# Patient Record
Sex: Male | Born: 1950 | Race: White | Hispanic: No | Smoking: Current every day smoker
Health system: Southern US, Community
[De-identification: ages and names within clinical notes are randomized; demographics above are authoritative.]

## PROBLEM LIST (undated history)

## (undated) DIAGNOSIS — K635 Polyp of colon: Secondary | ICD-10-CM

## (undated) DIAGNOSIS — I2510 Atherosclerotic heart disease of native coronary artery without angina pectoris: Secondary | ICD-10-CM

## (undated) DIAGNOSIS — E538 Deficiency of other specified B group vitamins: Secondary | ICD-10-CM

## (undated) DIAGNOSIS — K279 Peptic ulcer, site unspecified, unspecified as acute or chronic, without hemorrhage or perforation: Secondary | ICD-10-CM

## (undated) DIAGNOSIS — D649 Anemia, unspecified: Secondary | ICD-10-CM

## (undated) DIAGNOSIS — D61818 Other pancytopenia: Secondary | ICD-10-CM

## (undated) DIAGNOSIS — J449 Chronic obstructive pulmonary disease, unspecified: Secondary | ICD-10-CM

## (undated) DIAGNOSIS — R634 Abnormal weight loss: Secondary | ICD-10-CM

## (undated) DIAGNOSIS — K219 Gastro-esophageal reflux disease without esophagitis: Secondary | ICD-10-CM

## (undated) DIAGNOSIS — D72819 Decreased white blood cell count, unspecified: Secondary | ICD-10-CM

## (undated) HISTORY — PX: SEPTOPLASTY: SUR1290

## (undated) HISTORY — DX: Peptic ulcer, site unspecified, unspecified as acute or chronic, without hemorrhage or perforation: K27.9

---
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
## 1969-03-18 DIAGNOSIS — K279 Peptic ulcer, site unspecified, unspecified as acute or chronic, without hemorrhage or perforation: Secondary | ICD-10-CM

## 1969-03-18 HISTORY — PX: STOMACH SURGERY: SHX791

## 1969-03-18 HISTORY — DX: Peptic ulcer, site unspecified, unspecified as acute or chronic, without hemorrhage or perforation: K27.9

## 1994-03-18 HISTORY — PX: CARDIAC CATHETERIZATION: SHX172

## 1999-02-15 ENCOUNTER — Ambulatory Visit (HOSPITAL_COMMUNITY): Admission: RE | Admit: 1999-02-15 | Discharge: 1999-02-15 | Payer: Self-pay | Admitting: Otolaryngology

## 1999-02-15 ENCOUNTER — Encounter: Payer: Self-pay | Admitting: Otolaryngology

## 2005-04-24 ENCOUNTER — Encounter: Admission: RE | Admit: 2005-04-24 | Discharge: 2005-04-24 | Payer: Self-pay | Admitting: Neurosurgery

## 2005-05-08 ENCOUNTER — Encounter: Admission: RE | Admit: 2005-05-08 | Discharge: 2005-05-08 | Payer: Self-pay | Admitting: Neurosurgery

## 2011-11-15 ENCOUNTER — Other Ambulatory Visit (HOSPITAL_COMMUNITY): Payer: Self-pay | Admitting: Family Medicine

## 2011-11-15 DIAGNOSIS — R42 Dizziness and giddiness: Secondary | ICD-10-CM

## 2011-11-15 DIAGNOSIS — R0989 Other specified symptoms and signs involving the circulatory and respiratory systems: Secondary | ICD-10-CM

## 2011-11-19 ENCOUNTER — Other Ambulatory Visit (HOSPITAL_COMMUNITY): Payer: Self-pay | Admitting: Family Medicine

## 2011-11-19 ENCOUNTER — Ambulatory Visit (HOSPITAL_COMMUNITY)
Admission: RE | Admit: 2011-11-19 | Discharge: 2011-11-19 | Disposition: A | Discharge status: Home / Self Care | Payer: BC Managed Care – PPO | Source: Ambulatory Visit | Attending: Family Medicine | Admitting: Family Medicine

## 2011-11-19 DIAGNOSIS — F17200 Nicotine dependence, unspecified, uncomplicated: Secondary | ICD-10-CM | POA: Insufficient documentation

## 2011-11-19 DIAGNOSIS — R918 Other nonspecific abnormal finding of lung field: Secondary | ICD-10-CM | POA: Insufficient documentation

## 2011-11-19 DIAGNOSIS — R5381 Other malaise: Secondary | ICD-10-CM

## 2011-11-19 LAB — CBC WITH DIFFERENTIAL/PLATELET: RBC: 1.35

## 2011-11-19 IMAGING — CR DG CHEST 2V
2 series · 2 of 2 positions shown · non-contrast
Comparison: None.

CLINICAL DATA: Malaise and fatigue.  Smoker.

CHEST - 2 VIEW

[view not recorded (1 of 2)]
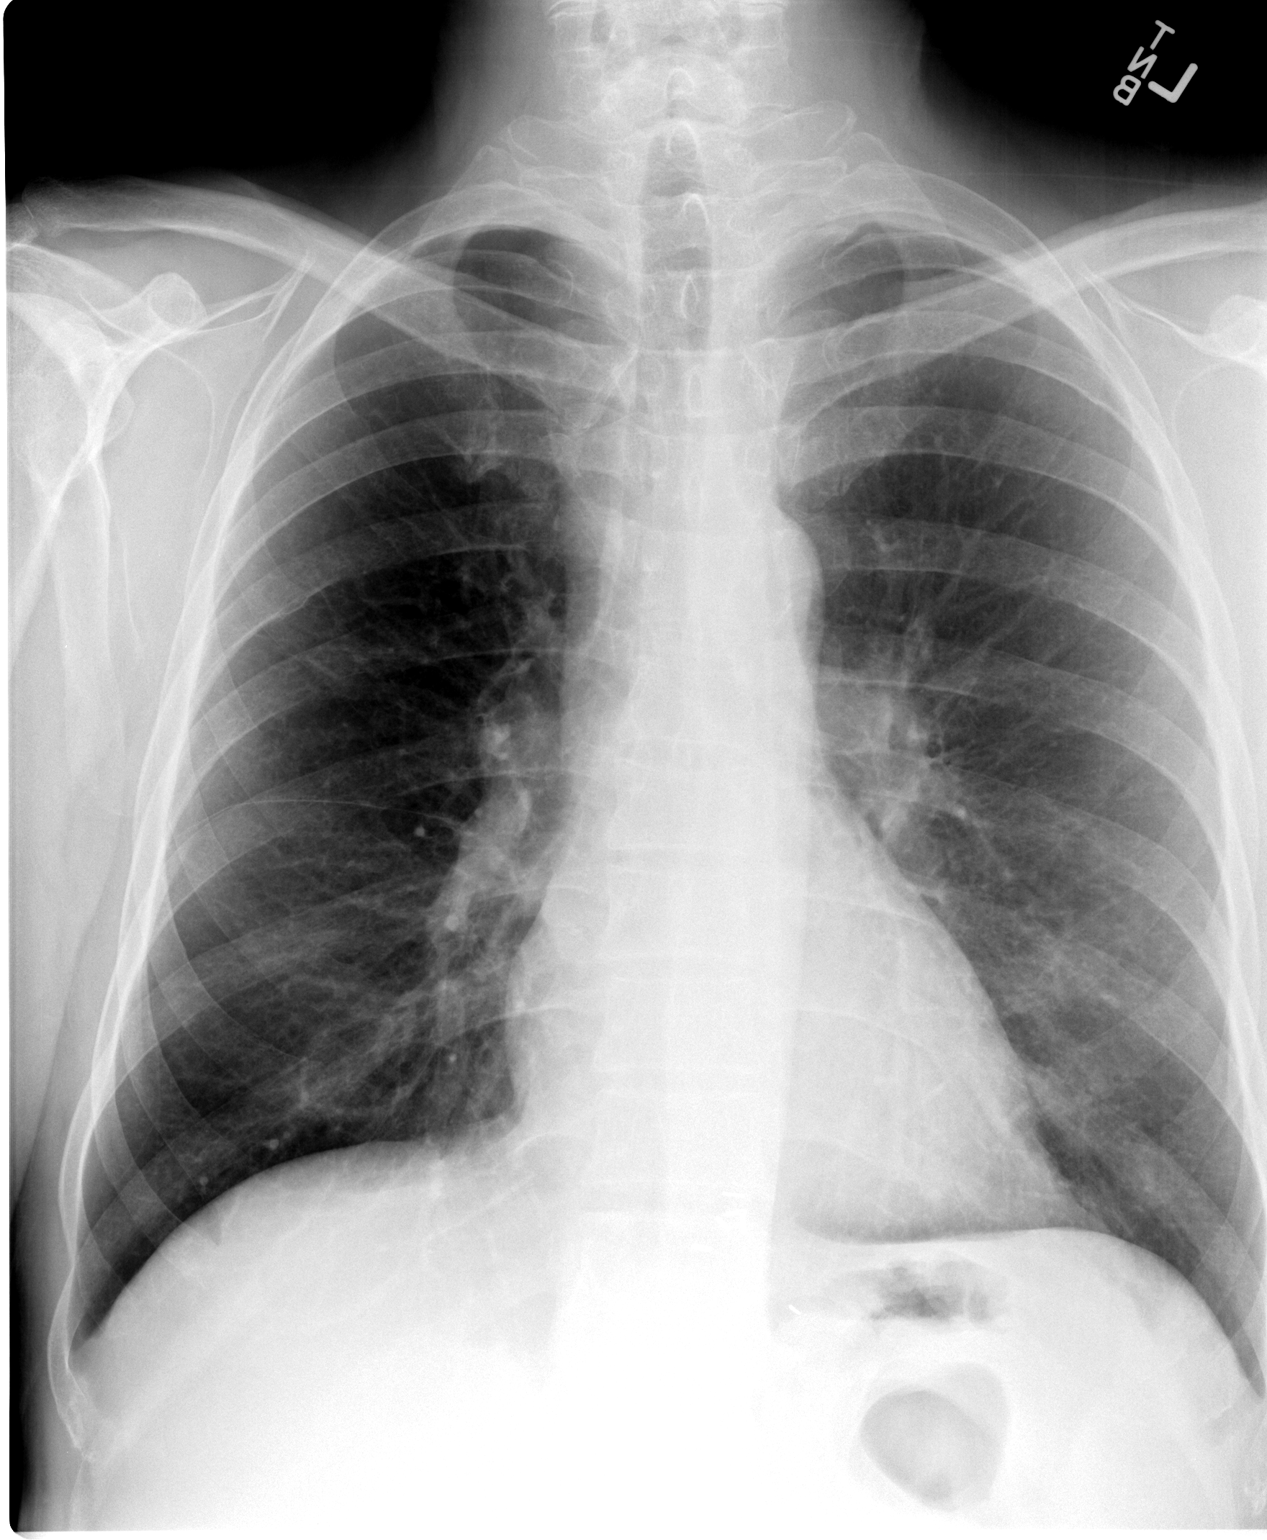

[view not recorded (2 of 2)]
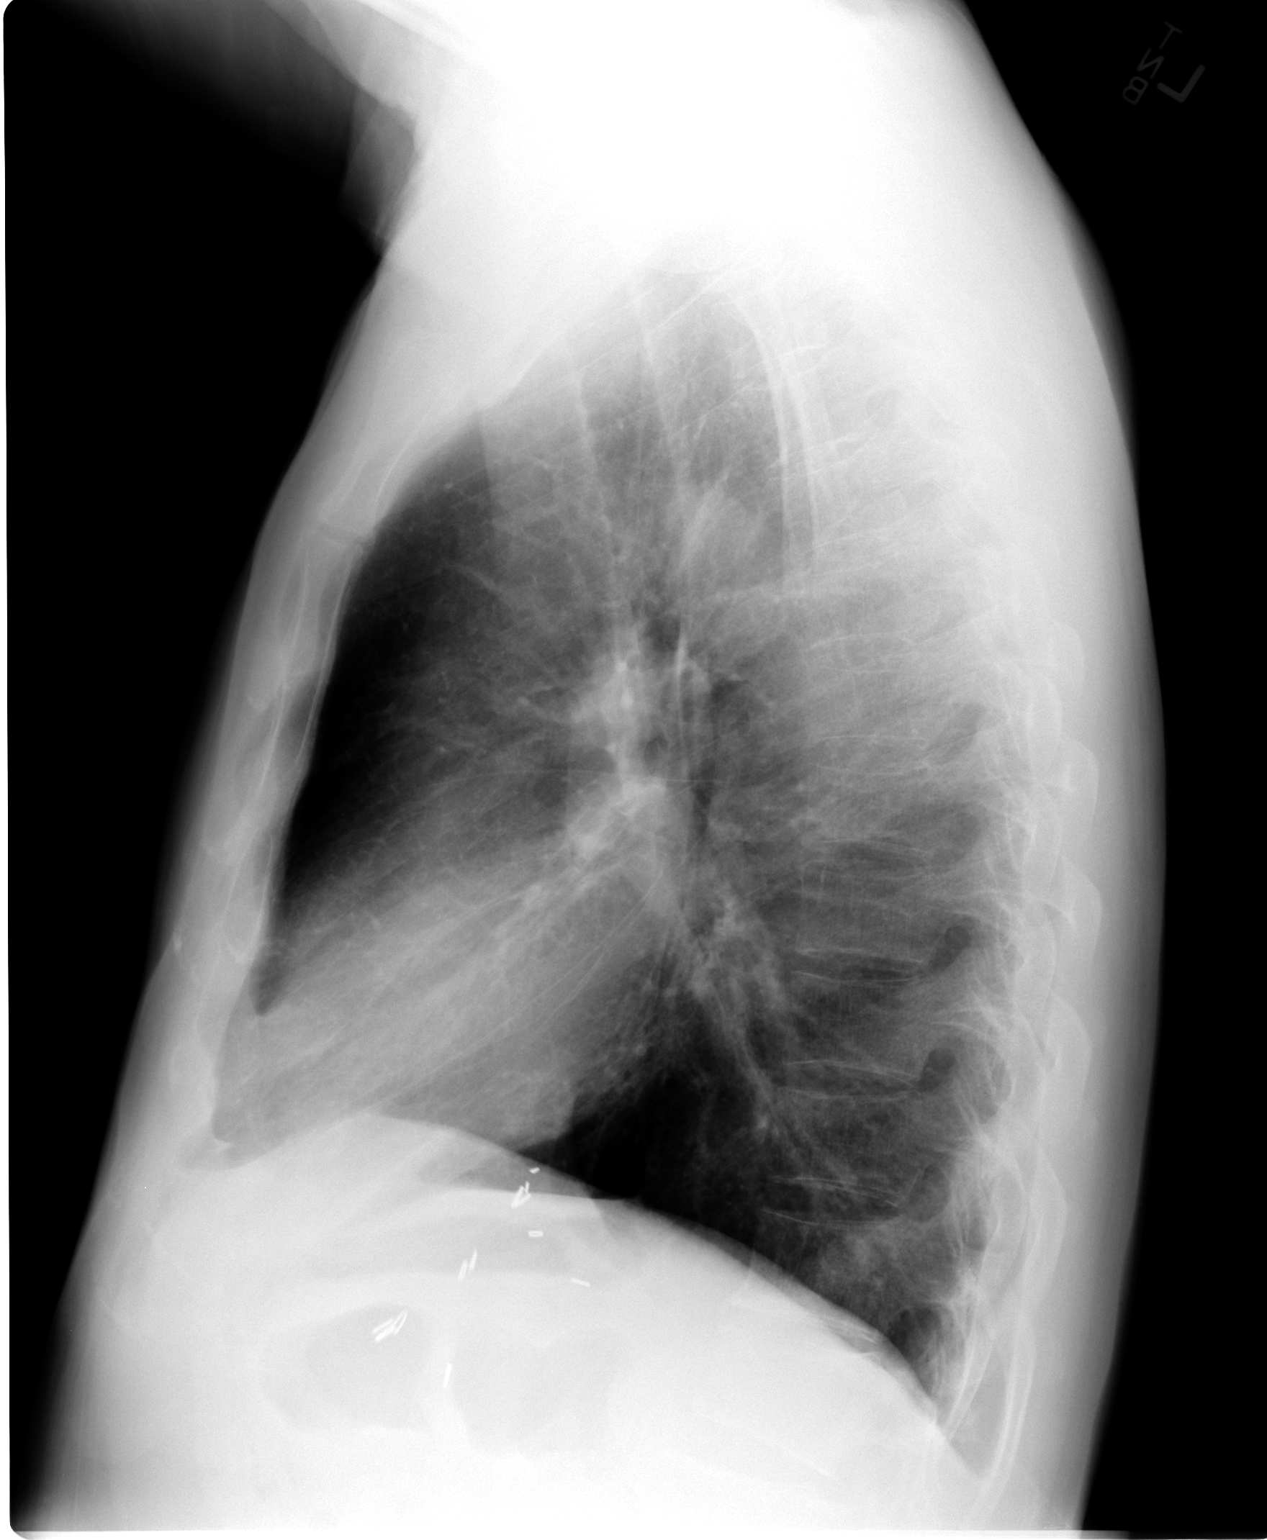

[2 of 2 positions shown; findings below may reference images not displayed]

FINDINGS: The heart, mediastinal, and hilar contours are within
normal limits.  The lungs are normally expanded.  Mild
peribronchial thickening is present. On the lateral view, a rounded
1.5 cm faint nodular density projects over a lower thoracic spine
vertebral body; a pulmonary nodule cannot be excluded.  Negative
for pleural effusion.  Trachea midline.  No acute bony abnormality.
IMPRESSION: Cannot exclude a pulmonary nodule at the lung bases on the lateral
view.  Faint 1.5 cm nodular density projects over the lumbar spine.
Further evaluation with chest CT is suggested. CT could be
performed without contrast for the purposes of evaluating for a
pulmonary nodule.

These results will be called to the ordering clinician or
representative by the Radiologist Assistant, and communication
documented in the PACS Dashboard.

## 2011-11-20 ENCOUNTER — Other Ambulatory Visit (HOSPITAL_COMMUNITY): Payer: Self-pay | Admitting: Family Medicine

## 2011-11-20 ENCOUNTER — Encounter (HOSPITAL_COMMUNITY): Payer: BC Managed Care – PPO

## 2011-11-20 ENCOUNTER — Ambulatory Visit (HOSPITAL_COMMUNITY)
Admission: RE | Admit: 2011-11-20 | Discharge: 2011-11-20 | Disposition: A | Discharge status: Home / Self Care | Payer: BC Managed Care – PPO | Source: Ambulatory Visit | Attending: Family Medicine | Admitting: Family Medicine

## 2011-11-20 ENCOUNTER — Encounter (HOSPITAL_COMMUNITY): Payer: BC Managed Care – PPO | Attending: Family Medicine

## 2011-11-20 ENCOUNTER — Telehealth (HOSPITAL_COMMUNITY): Payer: Self-pay

## 2011-11-20 VITALS — BP 126/67 | HR 77 | Temp 98.7°F | Resp 16

## 2011-11-20 DIAGNOSIS — D649 Anemia, unspecified: Secondary | ICD-10-CM

## 2011-11-20 DIAGNOSIS — R222 Localized swelling, mass and lump, trunk: Secondary | ICD-10-CM

## 2011-11-20 DIAGNOSIS — R918 Other nonspecific abnormal finding of lung field: Secondary | ICD-10-CM | POA: Insufficient documentation

## 2011-11-20 DIAGNOSIS — D61818 Other pancytopenia: Secondary | ICD-10-CM | POA: Insufficient documentation

## 2011-11-20 DIAGNOSIS — E538 Deficiency of other specified B group vitamins: Secondary | ICD-10-CM | POA: Insufficient documentation

## 2011-11-20 DIAGNOSIS — R935 Abnormal findings on diagnostic imaging of other abdominal regions, including retroperitoneum: Secondary | ICD-10-CM | POA: Insufficient documentation

## 2011-11-20 LAB — HEMOGLOBIN AND HEMATOCRIT, BLOOD: HCT: 15.3 % — ABNORMAL LOW (ref 39.0–52.0)

## 2011-11-20 LAB — PREPARE RBC (CROSSMATCH)

## 2011-11-20 IMAGING — CT CT CHEST W/O CM
2 of 3 series · 15 of 36 positions shown, 18 images · non-contrast
Comparison: Chest x-ray [DATE].

CLINICAL DATA: Abnormal chest x-ray.

CT CHEST WITHOUT CONTRAST
TECHNIQUE: Multidetector CT imaging of the chest was performed
following the standard protocol without IV contrast.

[Series 2: chestroutine 5.0 b40f · axial · 0.71mm/px · z∈[-377,-82]mm · 12 of 71 slices shown, 15 images]
[im 6/71  mediastinal]
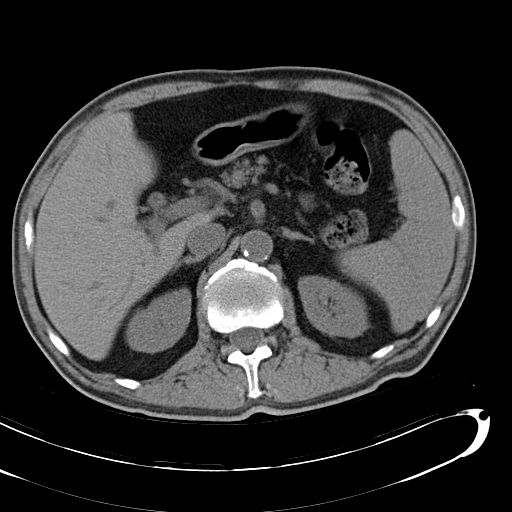
[im 6/71  lung]
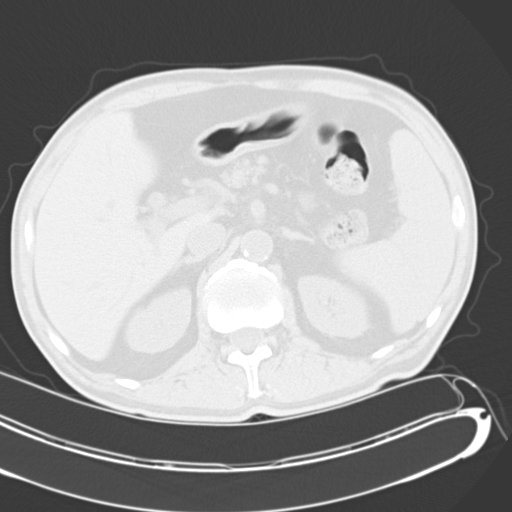
[im 11/71  lung]
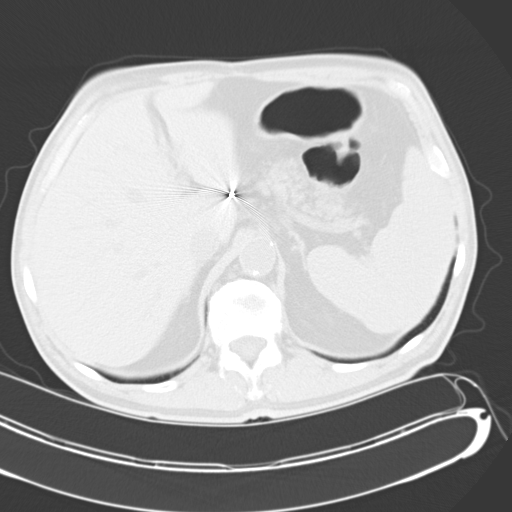
[im 16/71  lung]
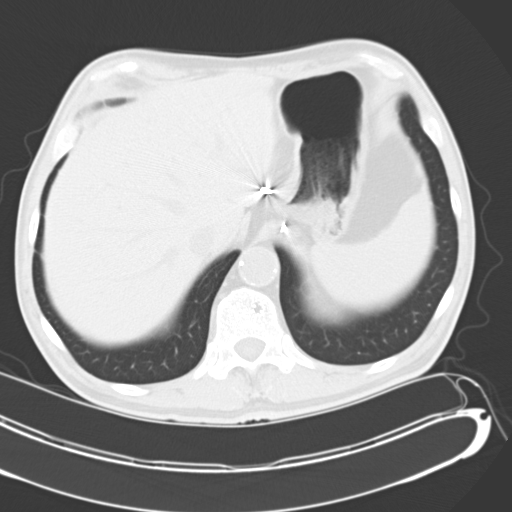
[im 21/71  lung]
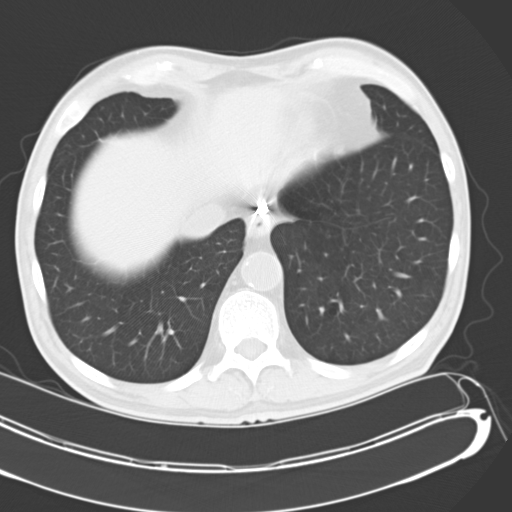
[im 26/71  mediastinal]
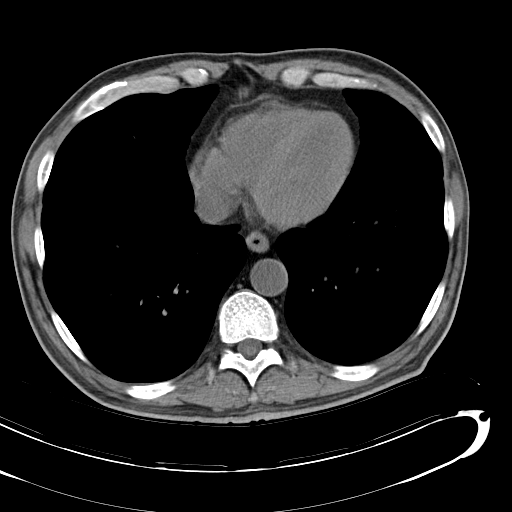
[im 26/71  lung]
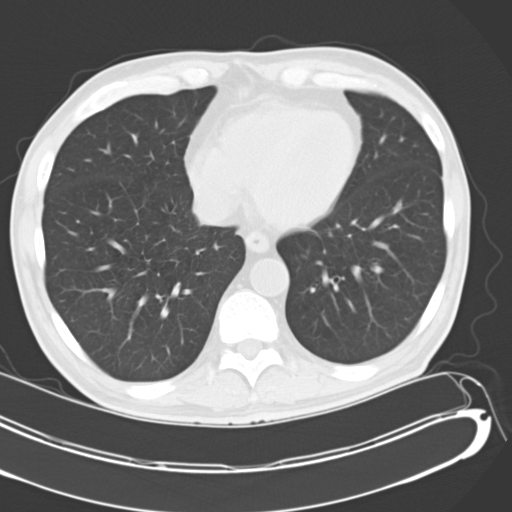
[im 32/71  lung]
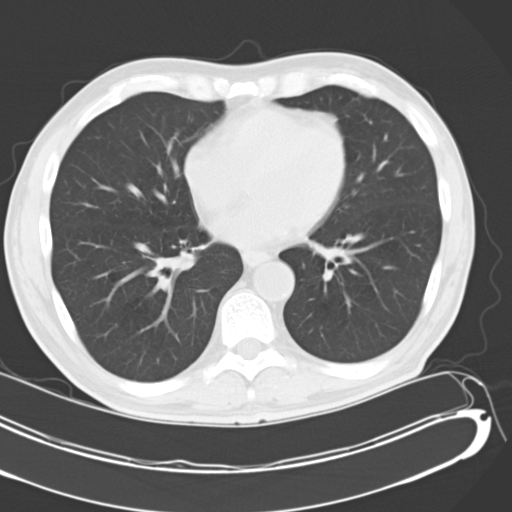
[im 39/71  lung]
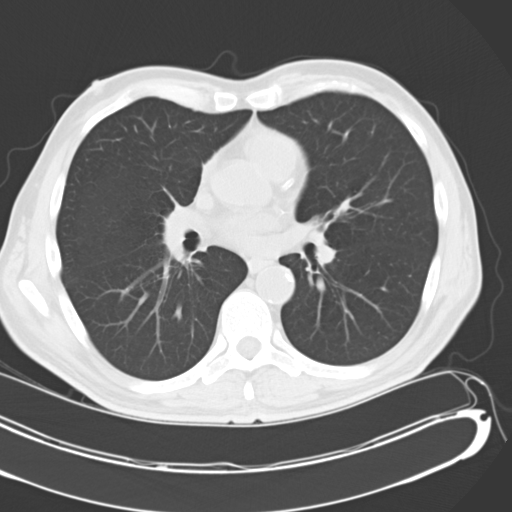
[im 45/71  lung]
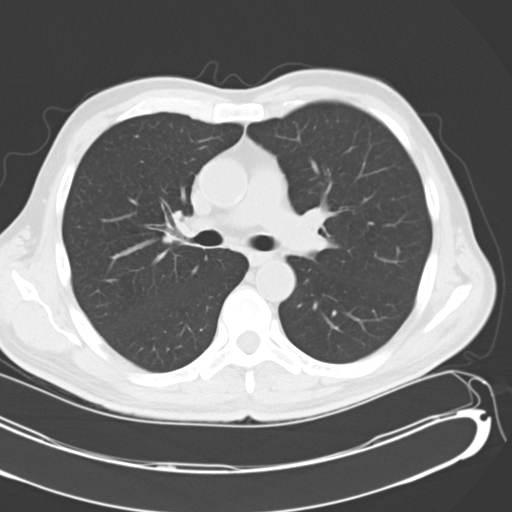
[im 50/71  mediastinal]
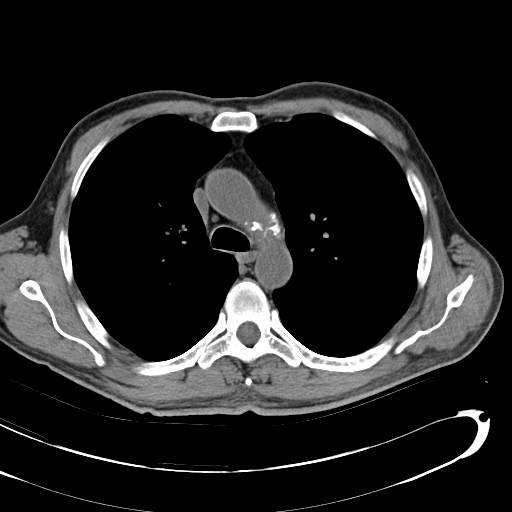
[im 50/71  lung]
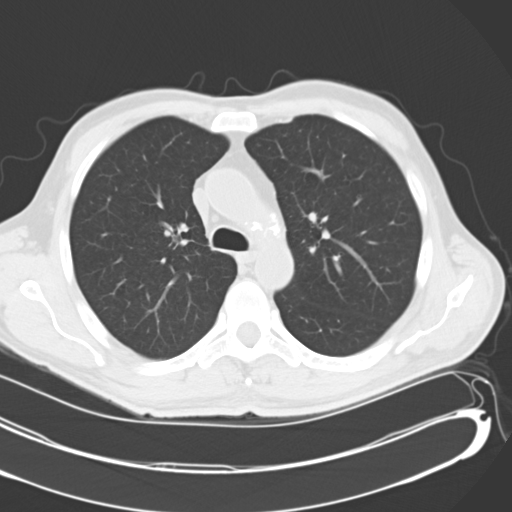
[im 55/71  lung]
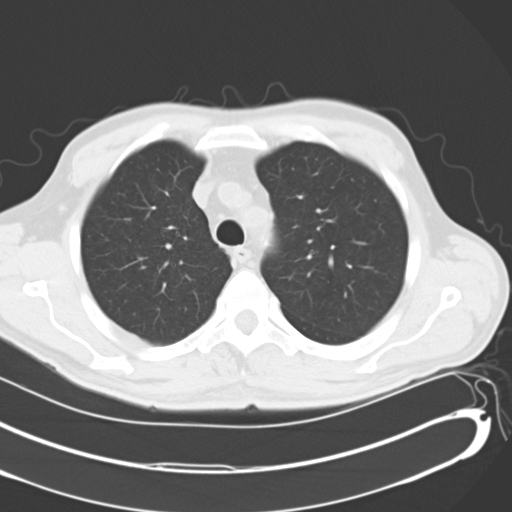
[im 60/71  lung]
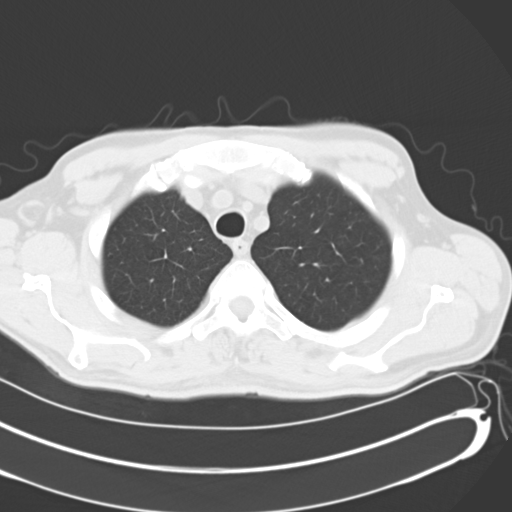
[im 65/71  lung]
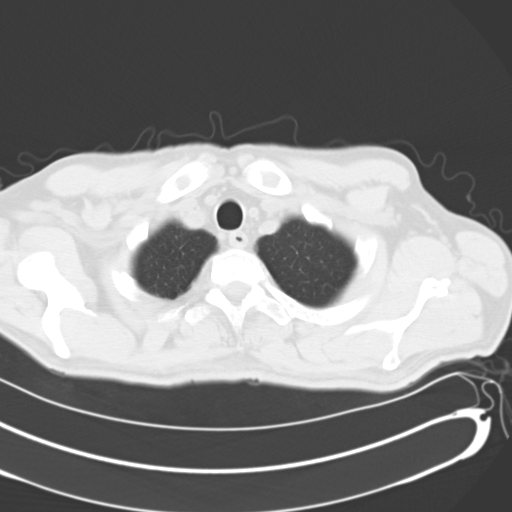

[Series 4: mpr coro 3mm · coronal · 0.64mm/px · 3 of 79 slices shown]
[im 16/79  lung]
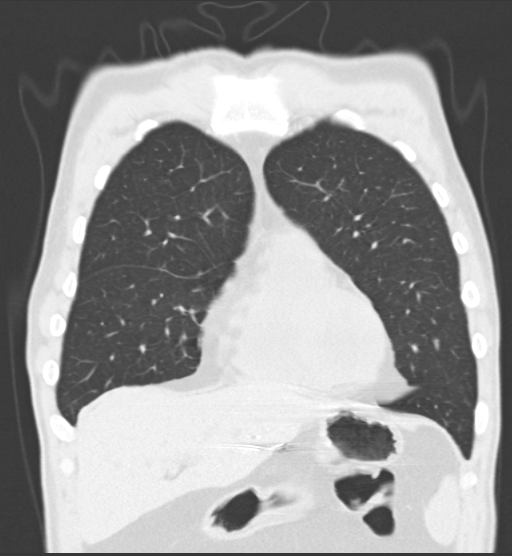
[im 32/79  lung]
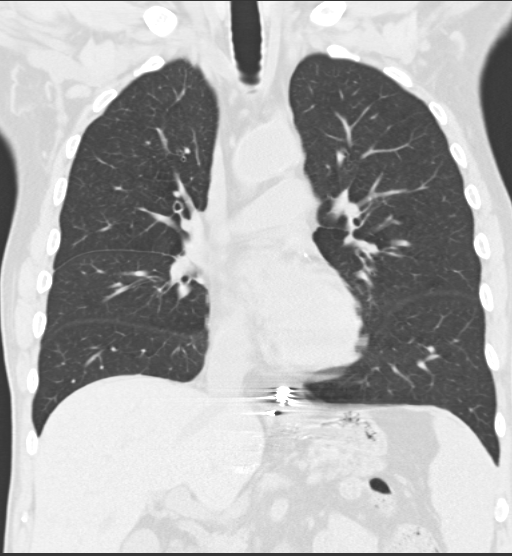
[im 47/79  lung]
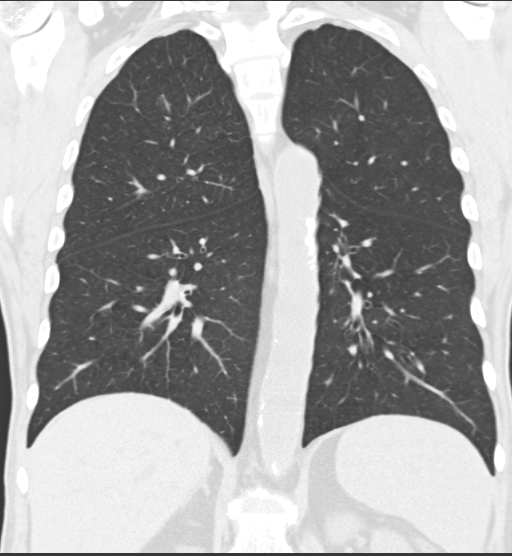

[15 of 36 positions shown; findings below may reference images not displayed]

FINDINGS: The chest wall is unremarkable.  No supraclavicular or
axillary lymphadenopathy.  The thyroid gland appears normal.  The
bony thorax is intact.  No destructive bone lesions or spinal canal
compromise. Moderate osteoporosis is noted.

The heart is normal in size.  No pericardial effusion.  Small
scattered mediastinal and hilar lymph nodes but no lymphadenopathy.
The aorta is normal in caliber.  Mild atherosclerotic
calcifications but no focal aneurysm.  Coronary artery
calcifications are noted.  The esophagus is grossly normal.
Surgical changes are noted near the GE junction.

Examination of the lung parenchyma demonstrates no acute pulmonary
findings.  No worrisome pulmonary nodule or mass.  Emphysematous
changes are noted.  No pleural effusion.

The upper abdomen demonstrates a mild splenomegaly.  Surgical
changes noted near the GE junction.
IMPRESSION: 1.  No worrisome pulmonary nodule or mass is identified.
2.  Emphysematous changes.
3.  Surgical changes noted at the GE junction.
4.  Query mild splenomegaly.  The entire spleen is not imaged.

## 2011-11-20 MED ORDER — SODIUM CHLORIDE 0.9 % IV SOLN
250.0000 mL | Freq: Once | INTRAVENOUS | Status: AC
  Administered 2011-11-20: 250 mL via INTRAVENOUS

## 2011-11-20 MED ORDER — SODIUM CHLORIDE 0.9 % IJ SOLN
10.0000 mL | INTRAMUSCULAR | Status: AC | PRN
  Administered 2011-11-20: 10 mL

## 2011-11-20 NOTE — Telephone Encounter (Signed)
CRITICAL VALUE ALERT Critical value received:  Hemoglobin 5.4 gms Date of notification:  11/20/11  Time of notification: 1045 Critical value read back:  yes Nurse who received alert:  Tobie Lords, RN MD notified (1st page):  N/A - scheduled for transfusion on 11/21/11

## 2011-11-20 NOTE — Progress Notes (Signed)
Tolerated transfusion without s/s adverse reaction. 

## 2011-11-20 NOTE — Progress Notes (Signed)
Labs drawn today for type and cross 

## 2011-11-21 ENCOUNTER — Encounter (HOSPITAL_COMMUNITY): Payer: BC Managed Care – PPO

## 2011-11-21 ENCOUNTER — Ambulatory Visit (HOSPITAL_COMMUNITY)
Admission: RE | Admit: 2011-11-21 | Discharge: 2011-11-21 | Disposition: A | Discharge status: Home / Self Care | Payer: BC Managed Care – PPO | Source: Ambulatory Visit | Attending: Family Medicine | Admitting: Family Medicine

## 2011-11-21 DIAGNOSIS — R42 Dizziness and giddiness: Secondary | ICD-10-CM

## 2011-11-21 DIAGNOSIS — R0989 Other specified symptoms and signs involving the circulatory and respiratory systems: Secondary | ICD-10-CM

## 2011-11-21 DIAGNOSIS — I2510 Atherosclerotic heart disease of native coronary artery without angina pectoris: Secondary | ICD-10-CM | POA: Insufficient documentation

## 2011-11-21 DIAGNOSIS — F17200 Nicotine dependence, unspecified, uncomplicated: Secondary | ICD-10-CM | POA: Insufficient documentation

## 2011-11-21 LAB — TYPE AND SCREEN
ABO/RH(D): O POS
Unit division: 0
Unit division: 0

## 2011-11-21 IMAGING — US US AORTA
1 series · 14 of 18 positions shown · non-contrast
Comparison: None.

CLINICAL DATA: Hypertension.  Evaluate for abdominal aortic
aneurysm.

ULTRASOUND OF ABDOMINAL AORTA
TECHNIQUE: Ultrasound examination of the abdominal aorta was
performed to evaluate for abdominal aortic aneurysm.

[Series 1: us aorta · 0.28mm/px · 14 of 18 slices shown]
[im 1/18]
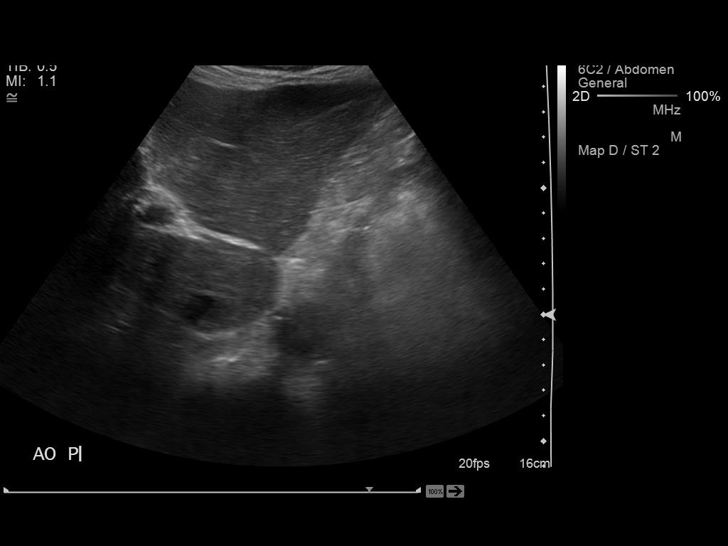
[im 2/18]
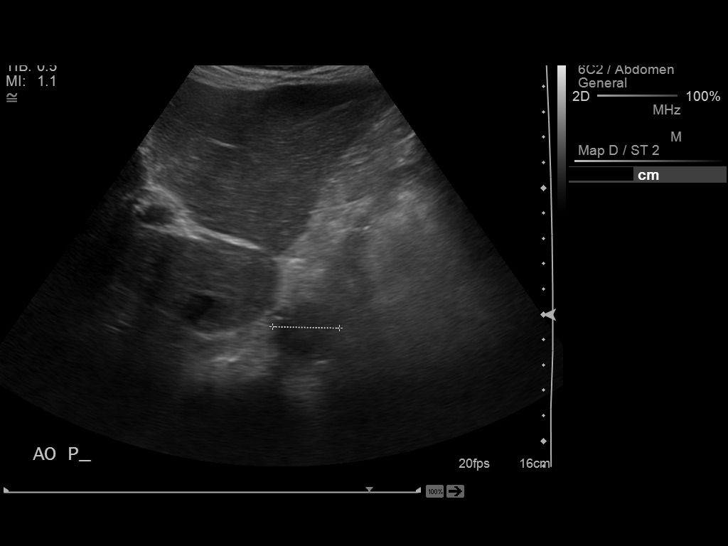
[im 4/18]
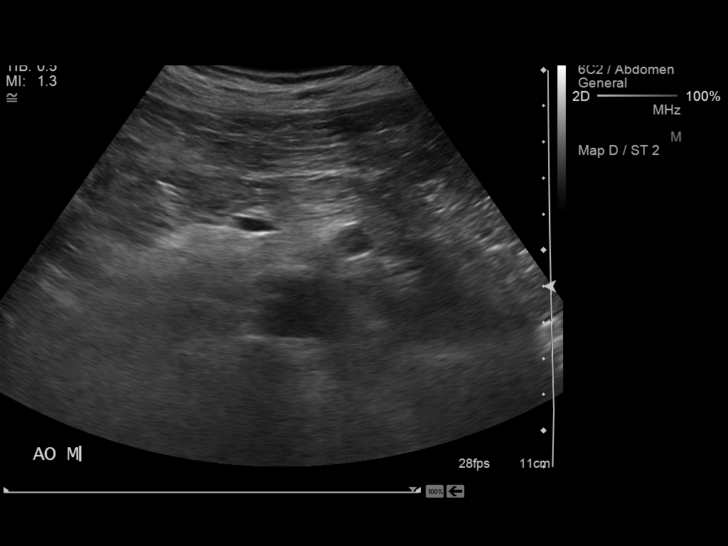
[im 5/18]
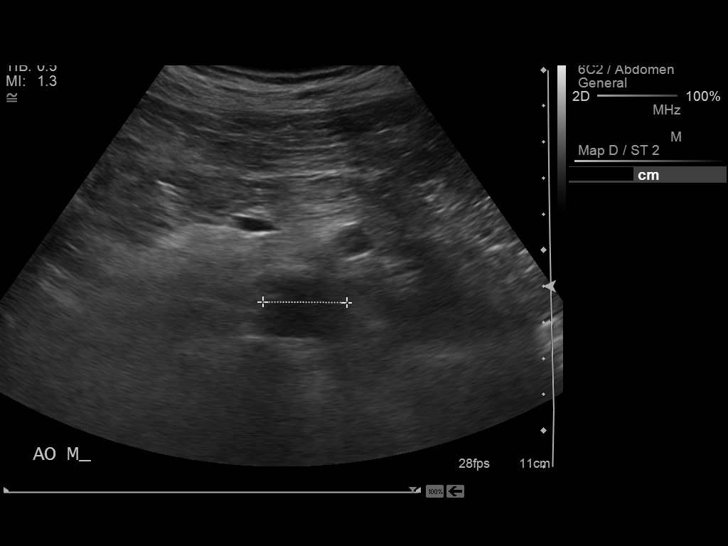
[im 6/18]
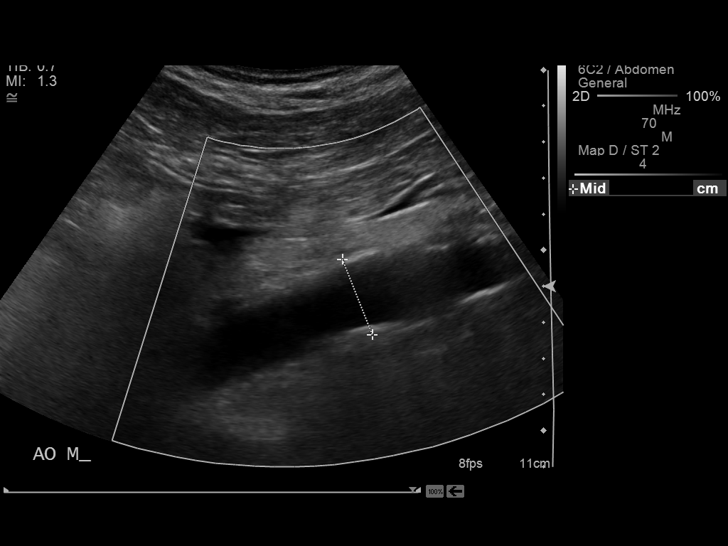
[im 8/18]
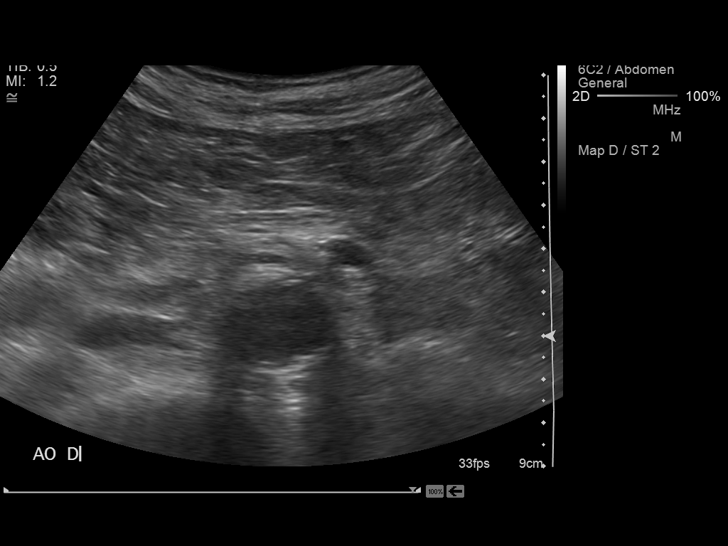
[im 9/18]
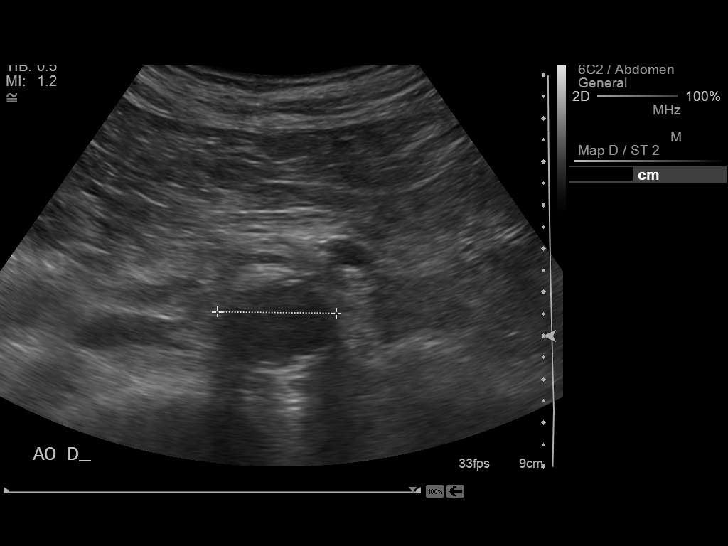
[im 10/18]
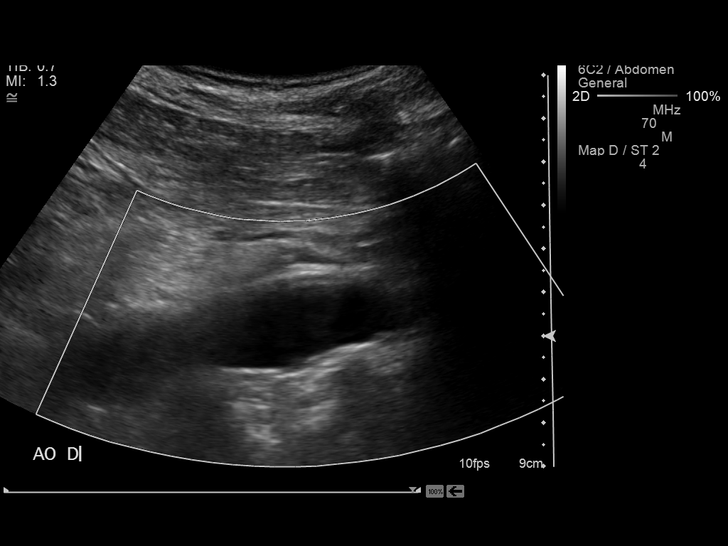
[im 11/18]
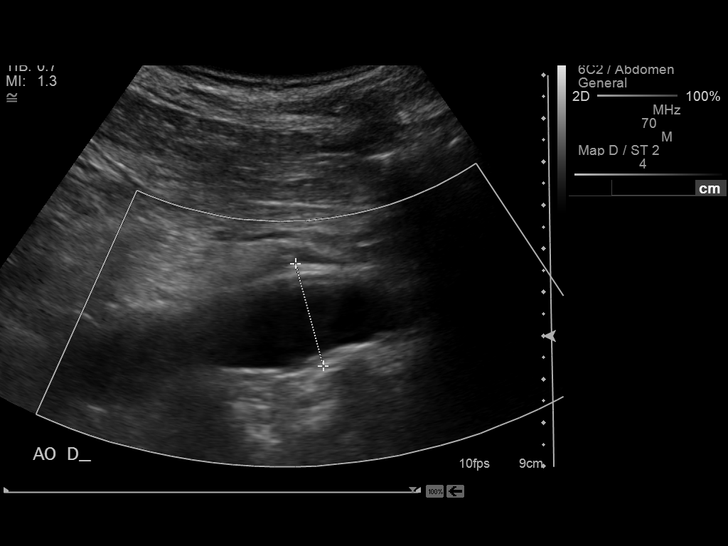
[im 13/18]
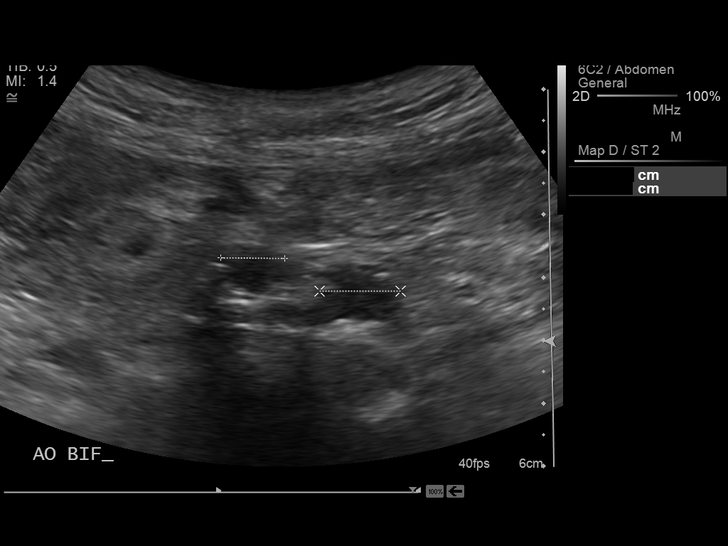
[im 14/18]
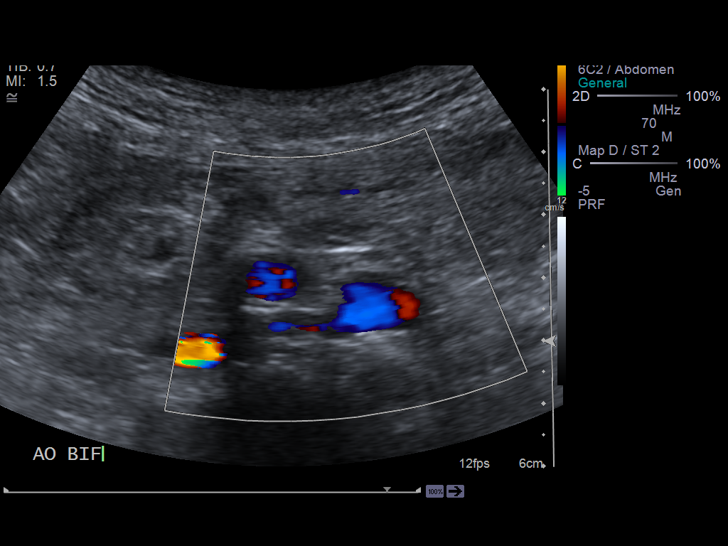
[im 15/18]
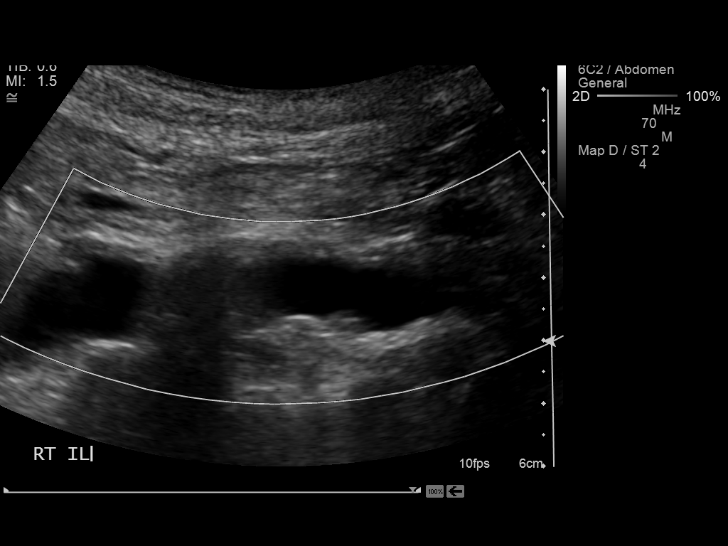
[im 17/18]
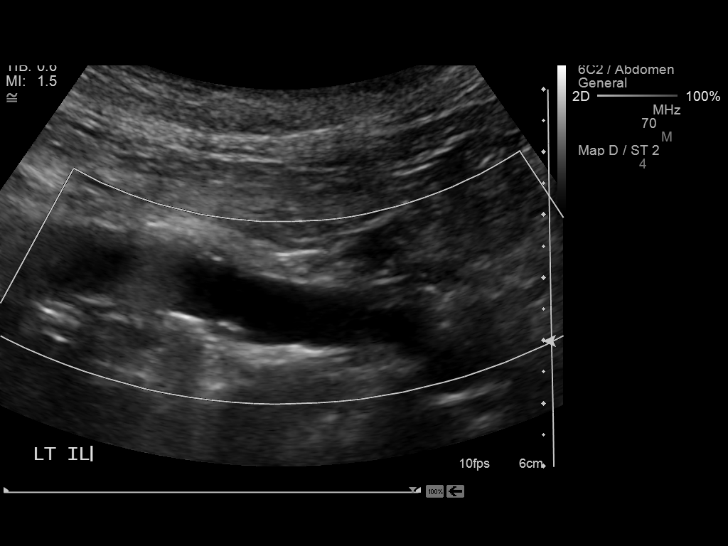
[im 18/18]
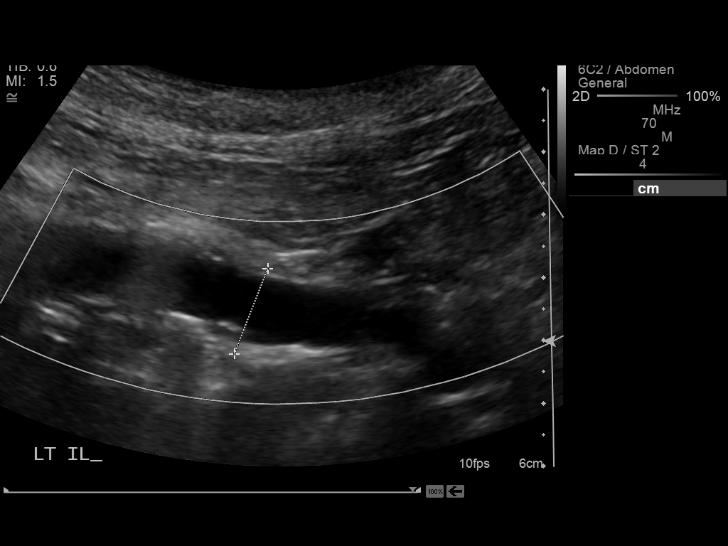

[14 of 18 positions shown; findings below may reference images not displayed]

Abdominal Aorta:  No aneurysm identified.  Mild atherosclerotic
plaque noted.

      Maximum AP diameter:  2.8 cm.
      Maximum TRV diameter:  2.6 cm.
IMPRESSION: No abdominal aortic aneurysm identified.

## 2011-11-21 IMAGING — US US CAROTID DUPLEX BILAT
1 series · 13 of 24 positions shown · non-contrast
Comparison: None.

CLINICAL DATA: Right carotid bruit, hypertension and tobacco use.
History of coronary artery disease.

BILATERAL CAROTID DUPLEX ULTRASOUND
TECHNIQUE: Gray scale imaging, color Doppler and duplex ultrasound
was performed of bilateral carotid and vertebral arteries in the
neck.

[Series 1: us carotid duplex bilat · 0.05mm/px · 13 of 67 slices shown]
[im 1/67]
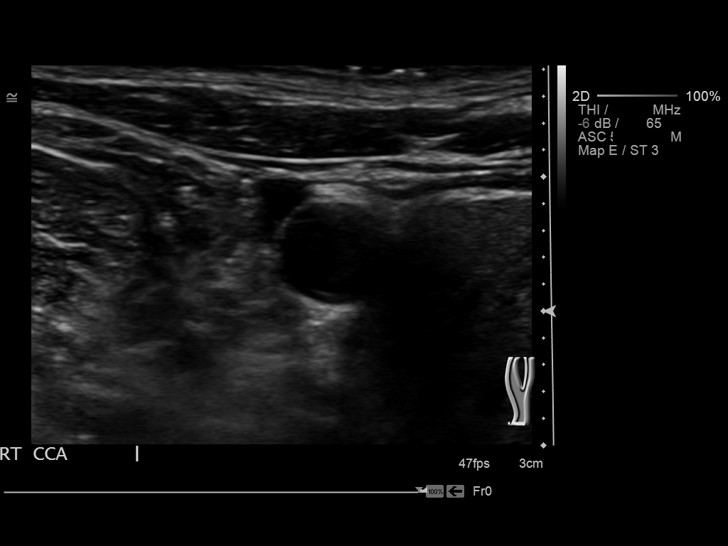
[im 6/67]
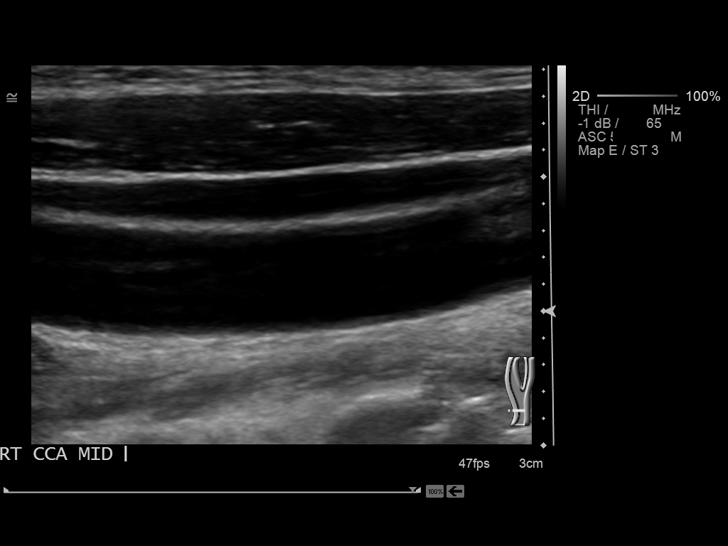
[im 12/67]
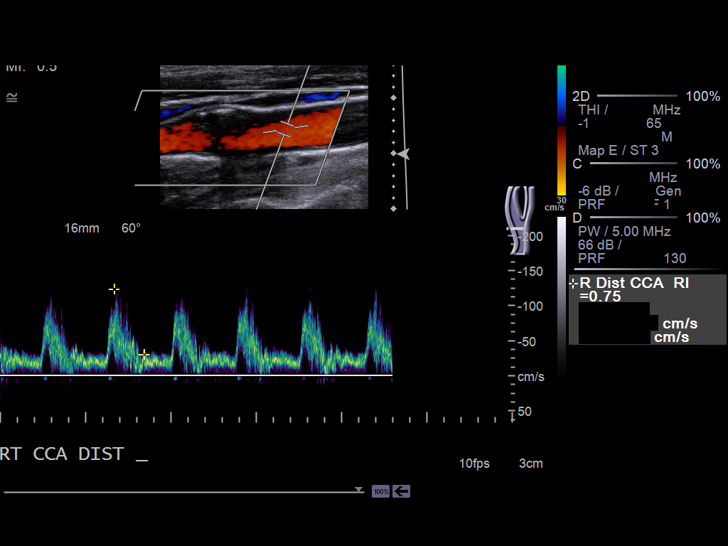
[im 18/67]
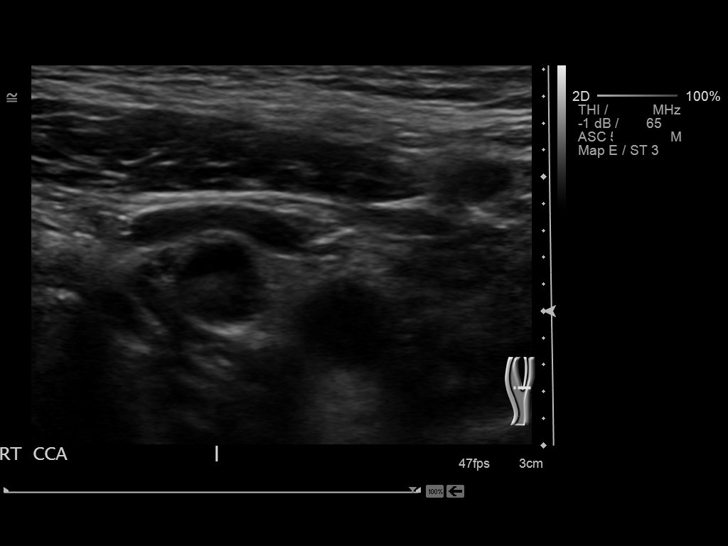
[im 23/67]
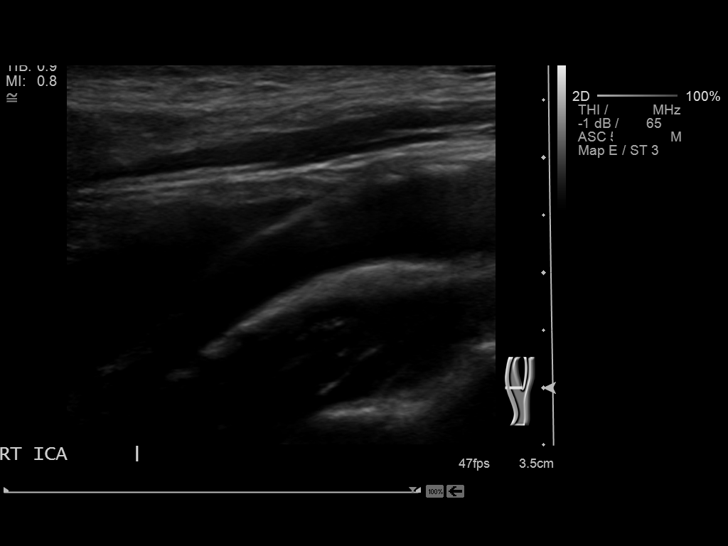
[im 29/67]
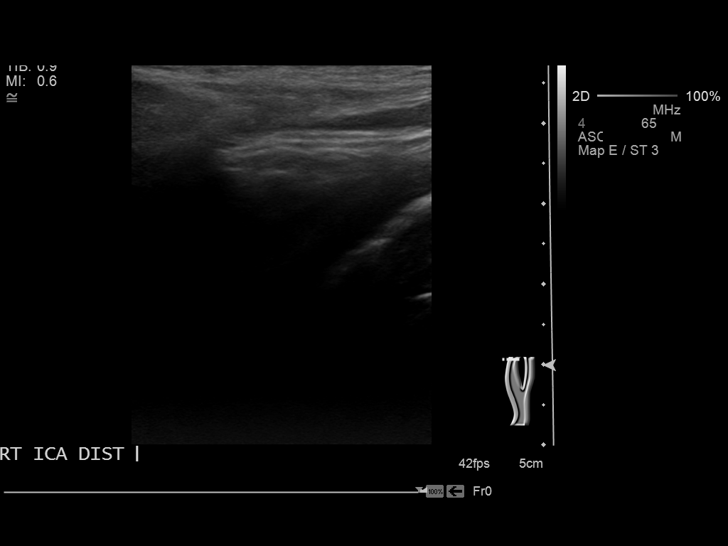
[im 35/67]
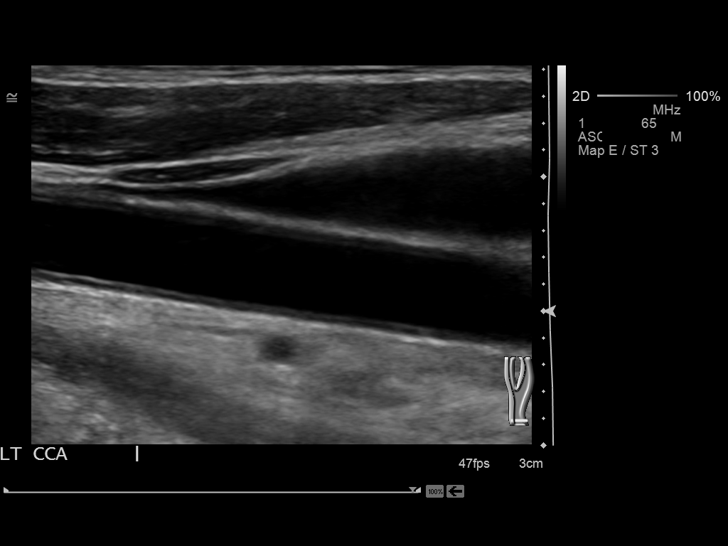
[im 38/67]
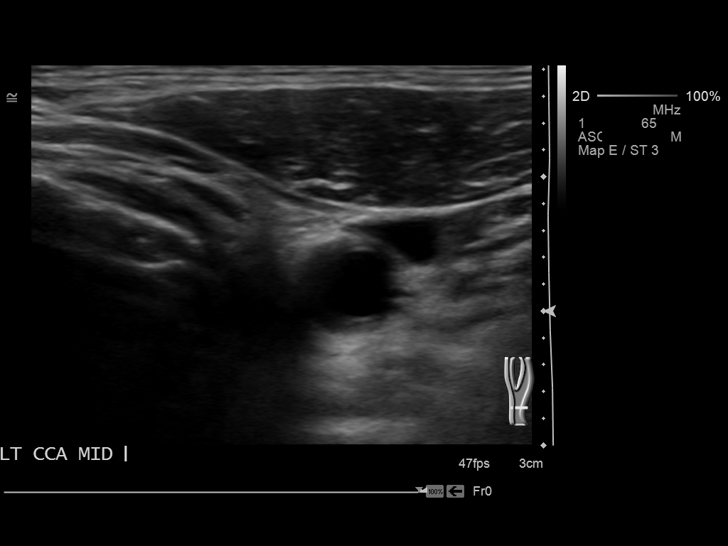
[im 44/67]
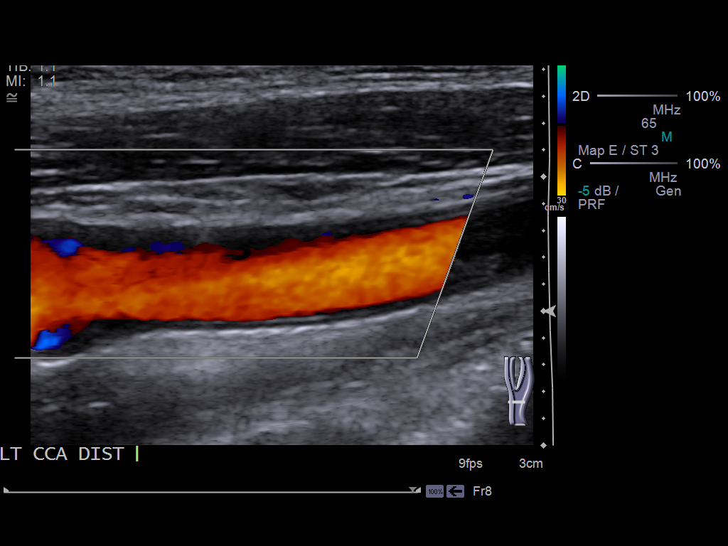
[im 49/67]
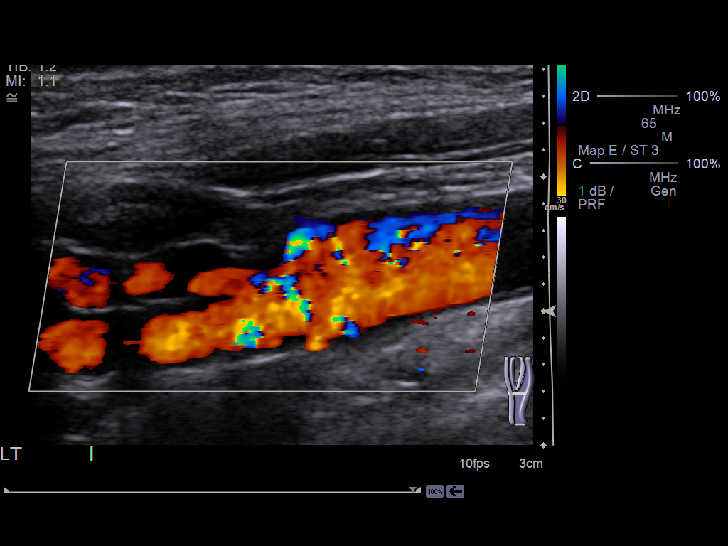
[im 55/67]
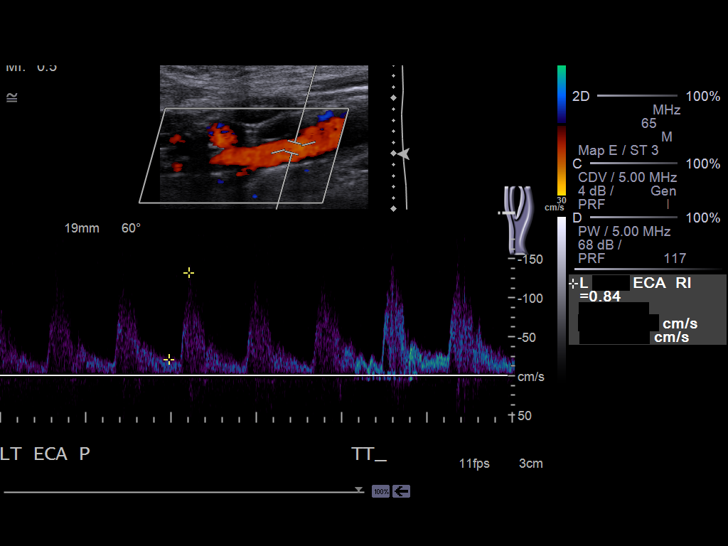
[im 61/67]
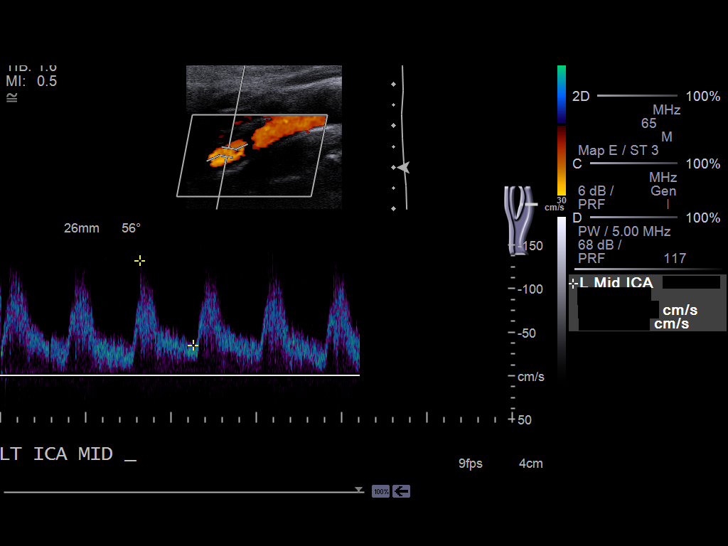
[im 67/67]
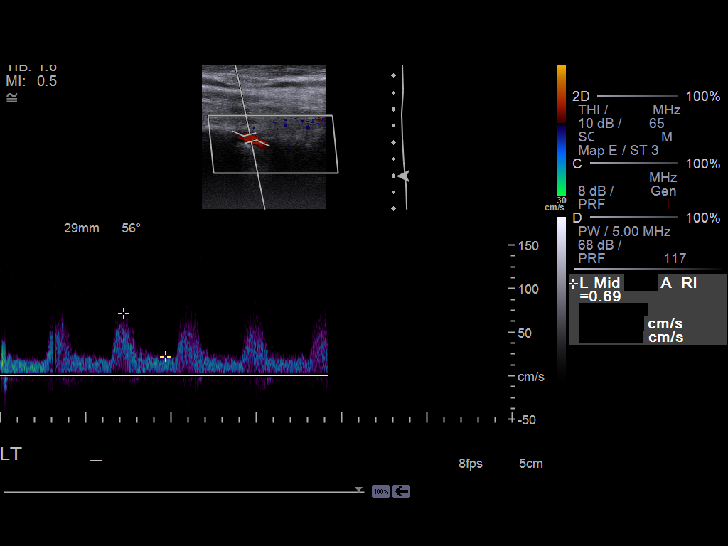

[13 of 24 positions shown; findings below may reference images not displayed]

Criteria:  Quantification of carotid stenosis is based on velocity
parameters that correlate the residual internal carotid diameter
with NASCET-based stenosis levels, using the diameter of the distal
internal carotid lumen as the denominator for stenosis measurement.

The following velocity measurements were obtained:

                 PEAK SYSTOLIC/END DIASTOLIC
RIGHT
ICA:                        132/35cm/sec
CCA:                        157/35cm/sec
SYSTOLIC ICA/CCA RATIO:
DIASTOLIC ICA/CCA RATIO:
ECA:                        100cm/sec

LEFT
ICA:                        132/35cm/sec
CCA:                        158/32cm/sec
SYSTOLIC ICA/CCA RATIO:
DIASTOLIC ICA/CCA RATIO:
ECA:                        132cm/sec
FINDINGS: RIGHT CAROTID ARTERY: A minimal amount of partially calcified
plaque is present at the level of the carotid bulb.  Minimal plaque
extends to the near the ICA origin.  Estimated ICA stenosis is less
than 50%.

RIGHT VERTEBRAL ARTERY:  Antegrade flow with normal wave form.

LEFT CAROTID ARTERY: No significant plaque identified.  There may
be a tiny amount of noncalcified plaque at the level of the distal
bulb.  No ICA plaque or stenosis identified.

LEFT VERTEBRAL ARTERY:  Antegrade flow with normal wave form.
IMPRESSION: Minimal amount of plaque at the level of the carotid bulbs without
evidence of significant carotid stenosis.

## 2011-11-26 ENCOUNTER — Telehealth: Payer: Self-pay | Admitting: *Deleted

## 2011-11-26 NOTE — Telephone Encounter (Signed)
LMOM to call.

## 2011-11-26 NOTE — Telephone Encounter (Signed)
Frank Lin called today to set up his colonoscopy. Please call him back.

## 2011-11-26 NOTE — Telephone Encounter (Signed)
Pt is scheduled to see Lorenza Burton, NP on 9/12/2013n at 8:00 Am. He recently had to have 2 pints of blood he states.

## 2011-11-28 ENCOUNTER — Ambulatory Visit (INDEPENDENT_AMBULATORY_CARE_PROVIDER_SITE_OTHER): Payer: BC Managed Care – PPO | Admitting: Urgent Care

## 2011-11-28 ENCOUNTER — Encounter (HOSPITAL_COMMUNITY): Payer: Self-pay | Admitting: Pharmacy Technician

## 2011-11-28 ENCOUNTER — Telehealth: Payer: Self-pay

## 2011-11-28 ENCOUNTER — Other Ambulatory Visit: Payer: Self-pay | Admitting: Urgent Care

## 2011-11-28 ENCOUNTER — Encounter (HOSPITAL_COMMUNITY): Payer: Self-pay | Admitting: *Deleted

## 2011-11-28 ENCOUNTER — Inpatient Hospital Stay (HOSPITAL_COMMUNITY)
Admission: AD | Admit: 2011-11-28 | Discharge: 2011-11-30 | DRG: 574 | Disposition: A | Discharge status: Home / Self Care | Payer: BC Managed Care – PPO | Source: Ambulatory Visit | Attending: Internal Medicine | Admitting: Internal Medicine

## 2011-11-28 ENCOUNTER — Encounter: Payer: Self-pay | Admitting: Urgent Care

## 2011-11-28 VITALS — BP 112/63 | HR 83 | Temp 97.8°F | Ht 69.0 in | Wt 158.0 lb

## 2011-11-28 DIAGNOSIS — IMO0002 Reserved for concepts with insufficient information to code with codable children: Secondary | ICD-10-CM

## 2011-11-28 DIAGNOSIS — K635 Polyp of colon: Secondary | ICD-10-CM

## 2011-11-28 DIAGNOSIS — F17200 Nicotine dependence, unspecified, uncomplicated: Secondary | ICD-10-CM | POA: Diagnosis present

## 2011-11-28 DIAGNOSIS — Z8711 Personal history of peptic ulcer disease: Secondary | ICD-10-CM

## 2011-11-28 DIAGNOSIS — D72819 Decreased white blood cell count, unspecified: Secondary | ICD-10-CM | POA: Diagnosis present

## 2011-11-28 DIAGNOSIS — E538 Deficiency of other specified B group vitamins: Secondary | ICD-10-CM | POA: Diagnosis present

## 2011-11-28 DIAGNOSIS — R634 Abnormal weight loss: Secondary | ICD-10-CM

## 2011-11-28 DIAGNOSIS — Z9861 Coronary angioplasty status: Secondary | ICD-10-CM

## 2011-11-28 DIAGNOSIS — D539 Nutritional anemia, unspecified: Secondary | ICD-10-CM | POA: Diagnosis present

## 2011-11-28 DIAGNOSIS — D649 Anemia, unspecified: Secondary | ICD-10-CM

## 2011-11-28 DIAGNOSIS — I2510 Atherosclerotic heart disease of native coronary artery without angina pectoris: Secondary | ICD-10-CM | POA: Diagnosis present

## 2011-11-28 DIAGNOSIS — E43 Unspecified severe protein-calorie malnutrition: Secondary | ICD-10-CM | POA: Diagnosis present

## 2011-11-28 DIAGNOSIS — R195 Other fecal abnormalities: Secondary | ICD-10-CM | POA: Insufficient documentation

## 2011-11-28 DIAGNOSIS — D61818 Other pancytopenia: Principal | ICD-10-CM

## 2011-11-28 DIAGNOSIS — K279 Peptic ulcer, site unspecified, unspecified as acute or chronic, without hemorrhage or perforation: Secondary | ICD-10-CM

## 2011-11-28 DIAGNOSIS — Z23 Encounter for immunization: Secondary | ICD-10-CM

## 2011-11-28 DIAGNOSIS — Z7982 Long term (current) use of aspirin: Secondary | ICD-10-CM

## 2011-11-28 DIAGNOSIS — R161 Splenomegaly, not elsewhere classified: Secondary | ICD-10-CM | POA: Diagnosis present

## 2011-11-28 DIAGNOSIS — Z9089 Acquired absence of other organs: Secondary | ICD-10-CM

## 2011-11-28 DIAGNOSIS — D126 Benign neoplasm of colon, unspecified: Secondary | ICD-10-CM | POA: Diagnosis present

## 2011-11-28 DIAGNOSIS — R17 Unspecified jaundice: Secondary | ICD-10-CM | POA: Diagnosis present

## 2011-11-28 DIAGNOSIS — Z87891 Personal history of nicotine dependence: Secondary | ICD-10-CM

## 2011-11-28 HISTORY — DX: Polyp of colon: K63.5

## 2011-11-28 HISTORY — DX: Atherosclerotic heart disease of native coronary artery without angina pectoris: I25.10

## 2011-11-28 HISTORY — DX: Abnormal weight loss: R63.4

## 2011-11-28 HISTORY — DX: Other pancytopenia: D61.818

## 2011-11-28 HISTORY — DX: Anemia, unspecified: D64.9

## 2011-11-28 HISTORY — DX: Deficiency of other specified B group vitamins: E53.8

## 2011-11-28 HISTORY — DX: Decreased white blood cell count, unspecified: D72.819

## 2011-11-28 LAB — CBC WITH DIFFERENTIAL/PLATELET
Basophils Absolute: 0 10*3/uL (ref 0.0–0.1)
Basophils Relative: 0 % (ref 0–1)
Eosinophils Absolute: 0.1 10*3/uL (ref 0.0–0.7)
Eosinophils Relative: 2 % (ref 0–5)
Eosinophils Relative: 2 % (ref 0–5)
Hemoglobin: 6.8 g/dL — CL (ref 13.0–17.0)
Lymphs Abs: 1.1 10*3/uL (ref 0.7–4.0)
MCH: 37.2 pg — ABNORMAL HIGH (ref 26.0–34.0)
MCH: 36.5 pg — ABNORMAL HIGH (ref 26.0–34.0)
MCHC: 35.2 g/dL (ref 30.0–36.0)
MCV: 105.5 fL — ABNORMAL HIGH (ref 78.0–100.0)
Monocytes Absolute: 0.1 10*3/uL (ref 0.1–1.0)
Monocytes Relative: 5 % (ref 3–12)
Monocytes Relative: 3 % (ref 3–12)
Neutrophils Relative %: 53 % (ref 43–77)
Neutrophils Relative %: 55 % (ref 43–77)
Platelets: 70 10*3/uL — ABNORMAL LOW (ref 150–400)
RBC: 1.67 MIL/uL — ABNORMAL LOW (ref 4.22–5.81)
WBC: 2.6 10*3/uL — ABNORMAL LOW (ref 4.0–10.5)

## 2011-11-28 LAB — COMPREHENSIVE METABOLIC PANEL
Alkaline Phosphatase: 80 U/L (ref 39–117)
BUN: 13 mg/dL (ref 6–23)
CO2: 28 mEq/L (ref 19–32)
Creat: 0.71 mg/dL (ref 0.50–1.35)
Glucose, Bld: 142 mg/dL — ABNORMAL HIGH (ref 70–99)
Sodium: 139 mEq/L (ref 135–145)
Total Bilirubin: 2.5 mg/dL — ABNORMAL HIGH (ref 0.3–1.2)
Total Protein: 6.4 g/dL (ref 6.0–8.3)

## 2011-11-28 LAB — IRON AND TIBC
Iron: 232 ug/dL — ABNORMAL HIGH (ref 42–165)
UIBC: <15 ug/dL — ABNORMAL LOW (ref 125–400)

## 2011-11-28 LAB — LACTATE DEHYDROGENASE: LDH: 2570 U/L — ABNORMAL HIGH (ref 94–250)

## 2011-11-28 LAB — PREPARE RBC (CROSSMATCH)

## 2011-11-28 LAB — URINALYSIS, ROUTINE W REFLEX MICROSCOPIC
Glucose, UA: NEGATIVE mg/dL
Hgb urine dipstick: NEGATIVE
Specific Gravity, Urine: <1.005 — ABNORMAL LOW (ref 1.005–1.030)

## 2011-11-28 LAB — VITAMIN B12: Vitamin B-12: 72 pg/mL — ABNORMAL LOW (ref 211–911)

## 2011-11-28 LAB — FOLATE: Folate: 8.4 ng/mL

## 2011-11-28 LAB — PROTIME-INR: Prothrombin Time: 16.8 seconds — ABNORMAL HIGH (ref 11.6–15.2)

## 2011-11-28 MED ORDER — PEG-KCL-NACL-NASULF-NA ASC-C 100 G PO SOLR: 1.0000 | ORAL | Status: DC

## 2011-11-28 MED ORDER — ACETAMINOPHEN 650 MG RE SUPP: 650.0000 mg | Freq: Four times a day (QID) | RECTAL | Status: DC | PRN

## 2011-11-28 MED ORDER — ALUM & MAG HYDROXIDE-SIMETH 200-200-20 MG/5ML PO SUSP: 30.0000 mL | Freq: Four times a day (QID) | ORAL | Status: DC | PRN

## 2011-11-28 MED ORDER — ACETAMINOPHEN 325 MG PO TABS: 650.0000 mg | ORAL_TABLET | Freq: Four times a day (QID) | ORAL | Status: DC | PRN

## 2011-11-28 MED ORDER — PEG 3350-KCL-NABCB-NACL-NASULF 236 G PO SOLR
4000.0000 mL | Freq: Once | ORAL | Status: AC
  Administered 2011-11-28: 4000 mL via ORAL
  Filled 2011-11-28: qty 4000

## 2011-11-28 MED ORDER — OMEPRAZOLE 20 MG PO CPDR: 20.0000 mg | DELAYED_RELEASE_CAPSULE | Freq: Every day | ORAL | Status: DC

## 2011-11-28 MED ORDER — SODIUM CHLORIDE 0.9 % IV SOLN
INTRAVENOUS | Status: DC
  Administered 2011-11-28: 20 mL/h via INTRAVENOUS

## 2011-11-28 MED ORDER — ONDANSETRON HCL 4 MG PO TABS: 4.0000 mg | ORAL_TABLET | Freq: Four times a day (QID) | ORAL | Status: DC | PRN

## 2011-11-28 MED ORDER — PANTOPRAZOLE SODIUM 40 MG PO TBEC
40.0000 mg | DELAYED_RELEASE_TABLET | Freq: Every day | ORAL | Status: DC
  Administered 2011-11-28 – 2011-11-30 (×2): 40 mg via ORAL
  Filled 2011-11-28 (×2): qty 1

## 2011-11-28 MED ORDER — ZOLPIDEM TARTRATE 5 MG PO TABS: 5.0000 mg | ORAL_TABLET | Freq: Every evening | ORAL | Status: DC | PRN

## 2011-11-28 MED ORDER — SODIUM CHLORIDE 0.9 % IJ SOLN
INTRAMUSCULAR | Status: AC
  Administered 2011-11-28: 10 mL
  Filled 2011-11-28: qty 3

## 2011-11-28 MED ORDER — INFLUENZA VIRUS VACC SPLIT PF IM SUSP
0.5000 mL | INTRAMUSCULAR | Status: AC
  Filled 2011-11-28: qty 0.5

## 2011-11-28 MED ORDER — ONDANSETRON HCL 4 MG/2ML IJ SOLN: 4.0000 mg | Freq: Four times a day (QID) | INTRAMUSCULAR | Status: DC | PRN

## 2011-11-28 NOTE — Progress Notes (Signed)
CRITICAL VALUE ALERT  Critical value received:  HGB 6.1 HCT 17.4  Date of notification: 11/28/11  Time of notification: 1644  Critical value read back:yes  Nurse who received alert: E.Grisell Bissette RN  MD notified (1st page):  Dr. Lendell Caprice  Time of first page: 1700  MD notified (2nd page):  Time of second page:  Responding MD:   Time MD responded:

## 2011-11-28 NOTE — Telephone Encounter (Signed)
T/C from Lorenza Burton, NP. She requested I call [facility] and see if CMP could be processed as STAT. I called and spoke to [place] in customer service. She said they would do so. It is normally a 4 hour turn around on STAT labs, but they will try to get sooner. I gave her Dr. Pincus Sanes phone number of [phone] to call report to and informed Dr. Lendell Caprice of the above.

## 2011-11-28 NOTE — Progress Notes (Signed)
REVIEWED.  

## 2011-11-28 NOTE — Patient Instructions (Addendum)
Colonoscopy & EGD as soon as possible w/ Dr Darrick Penna No Aspirin today until further notice Begin omeprazole 20mg  daily Go to lab now for blood draw & we will call you w/ results. To ER if trouble breathing, chest pain, weakness, dizziness or severe bleeding.

## 2011-11-28 NOTE — Assessment & Plan Note (Signed)
see "anemia"

## 2011-11-28 NOTE — Progress Notes (Signed)
Quick Note:  LMOM for return call. Pt's Hgb critically low. Please let me know when he returns call. Thanks ______

## 2011-11-28 NOTE — Assessment & Plan Note (Signed)
See anemia ?

## 2011-11-28 NOTE — Progress Notes (Signed)
Quick Note:  Discussed w/ Dr [name] & Jenita Seashore, PA (Hematology). Dr [name] out of town & [facility] cannot accept new patients this week. Paged & spoke w/ Dr Lendell Caprice. Agrees to admit patient to her service re: anemia, heme positive stool, pancytopenia, wt loss. Will arrange admission. Doris, LPN to call pt & advise pt to go straight to [facility]. Will place orders for colonoscopy/EGD w/ Dr [name] tomorrow.  Report given to Gerrit Halls, NP (hospital-based GI NP) [id number] M, MD   ______

## 2011-11-28 NOTE — Progress Notes (Signed)
Quick Note:  Discussed results w/ pt. He is at work & feels fine. Prefers no to go to [facility]. Will discuss w/ MD. ______

## 2011-11-28 NOTE — Progress Notes (Signed)
Quick Note:  Called pt and he agreed to go to the hospital, said ok to make arrangements. Called Joni Reining in bed control [phone]. She took the information and said to call pt and tell him to go to admitting and they will have him a bed. Pt is aware and will be on his way. Joni Reining is aware that Dr. Lendell Caprice is admitting physician. ______

## 2011-11-28 NOTE — Progress Notes (Signed)
Faxed to PCP

## 2011-11-28 NOTE — H&P (Signed)
Hospital Admission Note Date: 11/28/2011  Patient name: Frank Lin Medical record number: [id number] Date of birth: Jul 08, 1950 Age: 61 y.o. Gender: male PCP: [name], MD  Attending physician: Christiane Ha, MD  Chief Complaint: anemia and weight loss  History of Present Illness:  Frank Lin is an 61 y.o. male who was directly admitted from Dr. Lisette Grinder office with pancytopenia and unintentional 20 pound weight loss. He has been feeling poorly for the past several months. Malaise, fatigue, dyspnea on exertion, myalgias. Palpitations. He went to his primary care physician's office, had a chest x-ray, CT chest, carotid Dopplers, hemoglobin done. Workup was significant for anemia with a hemoglobin of 5.8. Full CBC was not done at that time. Patient had outpatient transfusion of 2 units of packed red blood cells. He felt better. He was referred to GI for workup of his anemia. On today's exam, he had heme positive brown stool. A CBC was done and he was noted to be pancytopenic. White blood cell count was 3000. Hemoglobin 6.8. MCV 105. Platelet count 76,000. Normal differential. His transfusion was a week ago. He takes aspirin and has bruising occasionally when he accidentally bumps into something. No rash. He's noticed no bleeding. CT of the chest showed no mass, but did show possible splenomegaly, but spleen was incompletely imaged. The patient has never had colonoscopy. He has a history of peptic ulcers and had surgery for same in 1971. Complete metabolic panel, anemia panel, TSH were drawn in the office but results are pending.  Past Medical History  Diagnosis Date  . Peptic ulcer 1971   coronary artery disease with history of stent in the 90s.  Meds: Prescriptions prior to admission  Medication Sig Dispense Refill  . aspirin 81 MG tablet Take 81 mg by mouth daily.        Allergies: Review of patient's allergies indicates no known allergies. History   Social History  .  Marital Status: Married    Spouse Name: N/A    Number of Children: 2  . 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 Smoking status: Former Smoker -- 0.5 packs/day for 45 years    Types: Cigarettes    Quit date: 11/27/1996  . Smokeless tobacco: Not on file  . Alcohol Use: No  . Drug Use: No  . Sexually Active: Not on file   Other Topics Concern  . Not on file   Social History Narrative  . No narrative on file   Family history: Mother had breast cancer. Father had unknown type of cancer. Brother has congestive heart failure  Past Surgical History  Procedure Date  . Appendectomy 1970  . Cardiac catheterization 1996    s/p PTCA/stent (Dr Riley Kill)  . Stomach surgery 1971    Hx PUD     Review of Systems: Systems reviewed and as per HPI, otherwise negative.  Physical Exam: Blood pressure 124/65, pulse 80, temperature 98.8 F (37.1 C), temperature source Oral, resp. rate 20, height 5\' 9"  (1.753 m), weight 70.761 kg (156 lb), SpO2 99.00%. BP 124/65  Pulse 80  Temp 98.8 F (37.1 C) (Oral)  Resp 20  Ht 5\' 9"  (1.753 m)  Wt 70.761 kg (156 lb)  BMI 23.04 kg/m2  SpO2 99%  General Appearance:    Alert, cooperative, no distress, pale   Head:    Normocephalic, without obvious abnormality, atraumatic  Eyes:    PERRL,  conjunctiva/corneas pale. EOM's intact, fundi    benign, both eyes          Nose:   Nares normal, septum midline, mucosa normal, no drainage    or sinus tenderness  Throat:   Lips, mucosa, and tongue normal; teeth and gums normal  Neck:   Supple, symmetrical, trachea midline, no adenopathy;       thyroid:  No enlargement/tenderness/nodules; no carotid   bruit or JVD  Back:     Symmetric, no curvature, ROM normal, no CVA tenderness  Lungs:     Clear to auscultation bilaterally, respirations unlabored  Chest wall:    No tenderness or deformity  Heart:    Regular rate and rhythm, S1 and S2  normal, no murmur, rub   or gallop  Abdomen:     Soft, non-tender, bowel sounds active all four quadrants,    no masses, no organomegaly  Genitalia:   deferred   Rectal:   heme positive brown stool per Lorenza Burton.   Extremities:   Extremities normal, atraumatic, no cyanosis or edema  Pulses:   2+ and symmetric all extremities  Skin:   Skin color, texture, turgor normal, no rashes or lesions  Lymph nodes:   Cervical, supraclavicular, and axillary nodes normal  Neurologic:   CNII-XII intact. Normal strength, sensation and reflexes      throughout    Lab results: Basic Metabolic Panel: No results found for this basename: NA:2,K:2,CL:2,CO2:2,GLUCOSE:2,BUN:2,CREATININE:2,CALCIUM:2,MG:2,PHOS:2 in the last 72 hours Liver Function Tests: No results found for this basename: AST:2,ALT:2,ALKPHOS:2,BILITOT:2,PROT:2,ALBUMIN:2 in the last 72 hours No results found for this basename: LIPASE:2,AMYLASE:2 in the last 72 hours No results found for this basename: AMMONIA:2 in the last 72 hours CBC:  Basename 11/28/11 0837  WBC 3.0*  NEUTROABS 1.5*  HGB 6.8*  HCT 19.3*  MCV 105.5*  PLT 76*   Assessment & Plan: Active Problems:  Unintentional weight loss  Heme positive stool  Pancytopenia  Tobacco use disorder  PUD (peptic ulcer disease)  Await anemia panel, complete metabolic panel, TSH. Will also check a PT, PTT, LDH, peripheral smear, HIV. Transfuse one unit of packed red blood cells tonight and reassess tomorrow. May need imaging of the abdomen. Will likely need eventual bone marrow biopsy if etiology not found. Will give protonix. empirically for now. Patient is scheduled for upper and lower endoscopy tomorrow. We'll place him on clear liquids, then nothing by mouth after midnight. The bowel prep has already been ordered by the GI service. They will follow. Dr. Laurie Panda is out of town, so inpatient hematology oncology consult cannot be done this week. Patient could potentially followup as an  outpatient if needed. Certainly, his weight loss in the setting of pancytopenia is concerning for cancer.  Mikka Kissner L 11/28/2011, 2:44 PM

## 2011-11-28 NOTE — Assessment & Plan Note (Addendum)
Frank Lin is a pleasant 60 y.o. male with three-month history of unintentional weight loss and weakness found to be profoundly anemic with hemoglobin of 5.7. He received a 2 unit transfusion of packed RBCs. He is feeling better. Denies any gross GI bleeding, however on exam is Hemoccult positive today. He has distant history of peptic ulcer disease requiring surgery. No GI symptoms at this time. Review of his labs actually show a pancytopenia. It is possible he may have GI bleed in setting of aspirin & pancytopenia unrelated.  Nonetheless he is going to require complete GI evaluation including colonoscopy and EGD with Dr. Darrick Penna to determine source of anemia and Hemoccult-positive stool.  I have discussed risks & benefits which include, but are not limited to, bleeding, infection, perforation & drug reaction.  The patient agrees with this plan & written consent will be obtained.    Stat CBC, anemia panel, TSH and CMP No Aspirin today until further notice Begin omeprazole 20mg  daily To ER if trouble breathing, chest pain, weakness, dizziness or severe bleeding.

## 2011-11-28 NOTE — Progress Notes (Addendum)
Referring Provider: [name], MD Primary Care Physician:  [name], MD Primary Gastroenterologist:  Dr.Rourk  Chief Complaint  Patient presents with  . Colonoscopy  . weight loss    HPI:  Frank Lin is a 61 y.o. male here as a referral from Dr. Regino Schultze for colonoscopy and anemia. He also reports an unintentional 20# wt loss in past 3 months.  He tells me he quit late night snacking so he fell this is why he was initially losing weight, however he continued to lose a significant amount of weight without trying.  He went to [place] to be evaluated.  He had severe weakness & he was dx w/ otitis.  He then became so weak he couold not walk.  He was sent to the lab & found to have anemia.  He received a 2 unit transfusion of blood 1 week ago.  Pretransfusion hemoglobin was 5.7. He also had pancytopenia with a white blood cell count 3.3, platelets 116 , and red blood cells 1.35.  He is now feeling better and really wants to leave for a one-week vacation at [place] in 2 days.   Denies any upper GI symptoms including heartburn, indigestion, nausea, vomiting, dysphagia, odynophagia or anorexia.   Denies any lower GI symptoms including constipation, diarrhea, rectal bleeding, melena.  Hx PUD requiring surgery 1971.  Denies any NSAIDs. He is on aspirin 81 mg daily. He has history of cardiac catheterization with PTCA/stent.  As a side note he had an aortic bruit on exam, however recent aortic ultrasound was benign.  Past Medical History  Diagnosis Date  . Peptic ulcer 1971    Past Surgical History  Procedure Date  . Appendectomy 1970  . Cardiac catheterization 1996    s/p PTCA/stent (Dr Riley Kill)  . Stomach surgery 1971    Hx PUD     Current Outpatient Prescriptions  Medication Sig Dispense Refill  . aspirin 81 MG tablet Take 81 mg by mouth daily.      Marland Kitchen omeprazole (PRILOSEC) 20 MG capsule Take 1 capsule (20 mg total) by mouth daily.  31 capsule  1    Allergies as of  11/28/2011  . (No Known Allergies)    Family History:There is no known family history of colorectal carcinoma , liver disease, or inflammatory bowel disease.  Problem Relation Age of Onset  . Heart failure Brother     History   Social History  . Marital Status: Married    Spouse Name: N/A    Number of Children: 2  . 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 Smoking status: Current Every Day Smoker -- 0.5 packs/day for 45 years    Types: Cigarettes  . Smokeless tobacco: Not on file  . Alcohol Use: No  . Drug Use: No  . Sexually Active: Not on file  Review of Systems: Gen: See history of present illness. CV: He was having palpitations and shortness of breath. This has resolved status post transfusion. Resp: See above. GI: Denies vomiting blood, jaundice, and fecal incontinence.   GU : Denies urinary burning, blood in urine, urinary frequency, urinary hesitancy, nocturnal urination, and urinary incontinence. MS: Denies joint pain, limitation of movement, and swelling, stiffness, low back pain, extremity pain. Denies muscle weakness, cramps, atrophy.  Derm: Denies rash, itching, dry skin, hives, moles, warts, or unhealing ulcers.  Psych: Denies depression, anxiety, memory loss, suicidal ideation, hallucinations, paranoia,  and confusion. Heme: Denies bruising, bleeding, and enlarged lymph nodes. Neuro:  Denies any headaches or paresthesias. Endo:  Denies any problems with DM, thyroid, adrenal function.  Physical Exam: BP 112/63  Pulse 83  Temp 97.8 F (36.6 C) (Temporal)  Ht 5\' 9"  (1.753 m)  Wt 158 lb (71.668 kg)  BMI 23.33 kg/m2 No LMP for male patient. General:   Alert,  Well-developed, thin, pleasant and cooperative in NAD Head:  Normocephalic and atraumatic. Eyes:  Sclera clear, no icterus.   Conjunctiva pink. Ears:  Normal auditory acuity. Nose:  No deformity, discharge, or lesions. Mouth:   No deformity or lesions,oropharynx pink & moist. Neck:  Supple; no masses or thyromegaly. Lungs:  Clear throughout to auscultation.   No wheezes, crackles, or rhonchi. No acute distress. Heart:  Regular rate and rhythm; no murmurs, clicks, rubs,  or gallops. Abdomen:  Normal bowel sounds. + Aortic bruit.  Soft, non-tender and non-distended without masses, hepatosplenomegaly or hernias noted.  No guarding or rebound tenderness.   Rectal:  No internal/external lesions palpated. Small amount of medium brown stool was obtained from the vault which is Hemoccult positive. Msk:  Symmetrical without gross deformities. Normal posture. Pulses:  Normal pulses noted. Extremities:  No clubbing or edema. Neurologic:  Alert and oriented x4;  grossly normal neurologically. Skin:  Intact without significant lesions or rashes. Lymph Nodes:  No significant cervical adenopathy. Psych:  Alert and cooperative. Normal mood and affect.

## 2011-11-29 ENCOUNTER — Ambulatory Visit (HOSPITAL_COMMUNITY): Payer: BC Managed Care – PPO

## 2011-11-29 ENCOUNTER — Encounter (HOSPITAL_COMMUNITY): Payer: Self-pay | Admitting: *Deleted

## 2011-11-29 ENCOUNTER — Encounter (HOSPITAL_COMMUNITY)
Admission: AD | Disposition: A | Discharge status: Home / Self Care | Payer: Self-pay | Source: Ambulatory Visit | Attending: Internal Medicine

## 2011-11-29 DIAGNOSIS — K2940 Chronic atrophic gastritis without bleeding: Secondary | ICD-10-CM

## 2011-11-29 DIAGNOSIS — R195 Other fecal abnormalities: Secondary | ICD-10-CM

## 2011-11-29 DIAGNOSIS — D510 Vitamin B12 deficiency anemia due to intrinsic factor deficiency: Secondary | ICD-10-CM

## 2011-11-29 DIAGNOSIS — D649 Anemia, unspecified: Secondary | ICD-10-CM

## 2011-11-29 DIAGNOSIS — R161 Splenomegaly, not elsewhere classified: Secondary | ICD-10-CM

## 2011-11-29 DIAGNOSIS — E538 Deficiency of other specified B group vitamins: Secondary | ICD-10-CM

## 2011-11-29 DIAGNOSIS — D126 Benign neoplasm of colon, unspecified: Secondary | ICD-10-CM

## 2011-11-29 HISTORY — DX: Deficiency of other specified B group vitamins: E53.8

## 2011-11-29 LAB — CBC
HCT: 19.6 % — ABNORMAL LOW (ref 39.0–52.0)
MCHC: 34.7 g/dL (ref 30.0–36.0)
Platelets: 77 10*3/uL — ABNORMAL LOW (ref 150–400)
RDW: 25.2 % — ABNORMAL HIGH (ref 11.5–15.5)

## 2011-11-29 LAB — BILIRUBIN, FRACTIONATED(TOT/DIR/INDIR)
Indirect Bilirubin: 1.6 mg/dL — ABNORMAL HIGH (ref 0.3–0.9)
Total Bilirubin: 1.8 mg/dL — ABNORMAL HIGH (ref 0.3–1.2)

## 2011-11-29 LAB — HIV ANTIBODY (ROUTINE TESTING W REFLEX): HIV: NONREACTIVE

## 2011-11-29 LAB — PREPARE RBC (CROSSMATCH)

## 2011-11-29 SURGERY — COLONOSCOPY WITH ESOPHAGOGASTRODUODENOSCOPY (EGD)
Anesthesia: Moderate Sedation

## 2011-11-29 MED ORDER — MIDAZOLAM HCL 5 MG/5ML IJ SOLN
INTRAMUSCULAR | Status: DC | PRN
  Administered 2011-11-29: 2 mg via INTRAVENOUS
  Administered 2011-11-29: 1 mg via INTRAVENOUS
  Administered 2011-11-29: 2 mg via INTRAVENOUS
  Administered 2011-11-29: 1 mg via INTRAVENOUS

## 2011-11-29 MED ORDER — STERILE WATER FOR IRRIGATION IR SOLN
Status: DC | PRN
  Administered 2011-11-29: 18:00:00

## 2011-11-29 MED ORDER — CYANOCOBALAMIN 1000 MCG/ML IJ SOLN
1000.0000 ug | Freq: Every day | INTRAMUSCULAR | Status: DC
  Administered 2011-11-29 – 2011-11-30 (×2): 1000 ug via INTRAMUSCULAR
  Filled 2011-11-29 (×2): qty 1

## 2011-11-29 MED ORDER — BUTAMBEN-TETRACAINE-BENZOCAINE 2-2-14 % EX AERO
INHALATION_SPRAY | CUTANEOUS | Status: DC | PRN
  Administered 2011-11-29: 2 via TOPICAL

## 2011-11-29 MED ORDER — MEPERIDINE HCL 100 MG/ML IJ SOLN
INTRAMUSCULAR | Status: AC
  Filled 2011-11-29: qty 2

## 2011-11-29 MED ORDER — MEPERIDINE HCL 100 MG/ML IJ SOLN
INTRAMUSCULAR | Status: DC | PRN
  Administered 2011-11-29: 50 mg via INTRAVENOUS
  Administered 2011-11-29 (×3): 25 mg via INTRAVENOUS

## 2011-11-29 MED ORDER — MIDAZOLAM HCL 5 MG/5ML IJ SOLN
INTRAMUSCULAR | Status: AC
  Filled 2011-11-29: qty 10

## 2011-11-29 NOTE — H&P (View-Only) (Signed)
Quick Note:  Called pt and he agreed to go to the hospital, said ok to make arrangements. Called Nicole in bed control [phone]. She took the information and said to call pt and tell him to go to admitting and they will have him a bed. Pt is aware and will be on his way. Nicole is aware that Dr. Sullivan is admitting physician. ______ 

## 2011-11-29 NOTE — Interval H&P Note (Signed)
History and Physical Interval Note:  11/29/2011 5:32 PM  Frank Lin  has presented today for surgery, with the diagnosis of anemia, heme positive stool  The various methods of treatment have been discussed with the patient and family. After consideration of risks, benefits and other options for treatment, the patient has consented to  Procedure(s) (LRB) with comments: COLONOSCOPY WITH ESOPHAGOGASTRODUODENOSCOPY (EGD) (N/A) as a surgical intervention .  The patient's history has been reviewed, patient examined, no change in status, stable for surgery.  I have reviewed the patient's chart and labs.  Questions were answered to the patient's satisfaction.     Eula Listen

## 2011-11-29 NOTE — Progress Notes (Signed)
Blood transfusion completed at this time, VSS, no s/s of reaction noted, family at bedside, encourage patient to continue to drink GoLytely as ordered by MD

## 2011-11-29 NOTE — Interval H&P Note (Signed)
History and Physical Interval Note:  11/29/2011 5:32 PM  Frank Lin  has presented today for surgery, with the diagnosis of anemia, heme positive stool  The various methods of treatment have been discussed with the patient and family. After consideration of risks, benefits and other options for treatment, the patient has consented to  Procedure(s) (LRB) with comments: COLONOSCOPY WITH ESOPHAGOGASTRODUODENOSCOPY (EGD) (N/A) as a surgical intervention .  The patient's history has been reviewed, patient examined, no change in status, stable for surgery.  I have reviewed the patient's chart and labs.  Questions were answered to the patient's satisfaction.     Hezzie Karim   

## 2011-11-29 NOTE — Progress Notes (Signed)
Tap water enema to be given this am, patient lying on right side, small amount of tap water enema placed in patient's rectum, patient began to cough and gag, patient denied to receive anymore, patient instructed to hold to as long as possible before using bathroom

## 2011-11-29 NOTE — Progress Notes (Signed)
UR Chart Review Completed  

## 2011-11-29 NOTE — Progress Notes (Signed)
INITIAL ADULT NUTRITION ASSESSMENT Date: 11/29/2011   Time: 1:05 PM Reason for Assessment: MST=2  Pt meets criteria for severe malnutrition in the context of chronic illness as evidenced by 11.6% weight loss x 3 months, weakness (reduced grip strength).  INTERVENTION: Will follow for diet advancement, add supplements with diet advancement.   ASSESSMENT: Male 61 y.o.  Dx: <principal problem not specified>  Hx:  Past Medical History  Diagnosis Date  . Peptic ulcer 1971  . CAD in native artery    Past Surgical History  Procedure Date  . Appendectomy 1970  . Cardiac catheterization 1996    s/p PTCA/stent (Dr Riley Kill)  . Stomach surgery 1971    Hx PUD    Related Meds:  Scheduled Meds:   . cyanocobalamin  1,000 mcg Intramuscular Daily  . influenza  inactive virus vaccine  0.5 mL Intramuscular Tomorrow-1000  . pantoprazole  40 mg Oral Q1200  . polyethylene glycol  4,000 mL Oral Once  . sodium chloride       Continuous Infusions:   . sodium chloride 20 mL/hr (11/28/11 1522)   PRN Meds:.acetaminophen, acetaminophen, alum & mag hydroxide-simeth, ondansetron (ZOFRAN) IV, ondansetron, zolpidem  Ht: 5\' 9"  (175.3 cm)  Wt: 156 lb (70.761 kg)  Ideal Wt:  70.7 kg % Ideal Wt: 98%  Usual Wt: 176# % Usual Wt: 89% Wt Readings from Last 10 Encounters:  11/28/11 156 lb (70.761 kg)  11/28/11 156 lb (70.761 kg)  11/28/11 158 lb (71.668 kg)   Body mass index is 23.04 kg/(m^2).  Food/Nutrition Related Hx: Pt with hx of 20# weight loss, weakness, fatigue, and malaise x 3 months. Chart reviewed. Pt started losing weight, which he initially thought was due to cutting out late night snacking, but continued to lose weight unintentionally. He does not take nutritional supplements at home.  Weight loss of 20# in 3 months reflects a 11.4% weight loss.  Pt currently NPO due to EGD and colonoscopy.  Oncology was consulted; pt was a vitamin B-12 deficiency, but also must rule out malignancy  due to weight loss accompanied with pancytopenia.   Labs:  CMP     Component Value Date/Time   NA 139 11/28/2011 0837   K 4.0 11/28/2011 0837   CL 108 11/28/2011 0837   CO2 28 11/28/2011 0837   GLUCOSE 142* 11/28/2011 0837   BUN 13 11/28/2011 0837   CREATININE 0.71 11/28/2011 0837   CALCIUM 8.9 11/28/2011 0837   PROT 6.4 11/28/2011 0837   ALBUMIN 4.2 11/28/2011 0837   AST 40* 11/28/2011 0837   ALT 27 11/28/2011 0837   ALKPHOS 80 11/28/2011 0837   BILITOT 1.8* 11/29/2011 0444   Intake:   Intake/Output Summary (Last 24 hours) at 11/29/11 1340 Last data filed at 11/29/11 1151  Gross per 24 hour  Intake   1900 ml  Output      0 ml  Net   1900 ml     Diet Order: NPO  Supplements/Tube Feeding: none at this time  IVF:    sodium chloride Last Rate: 20 mL/hr (11/28/11 1522)    Estimated Nutritional Needs:   Kcal:1642-1791 kcals daily Protein:57-71 grams protein daily Fluid:1.6-1.8 L fluid daily  NUTRITION DIAGNOSIS: -Inadequate oral intake (NI-2.1).  Status: Ongoing  RELATED TO: increased nutrient needs due to possible malignancy  AS EVIDENCE BY: weight loss of 11.4% x 3 months.   MONITORING/EVALUATION(Goals): Diet advancement and tolerance, labs, weight changes, po intake, and changes in status. 1) Pt will maintain current weight of  156# 2) Pt will be advanced to a PO diet as medically indicated 3) Pt will meet >75% of estimated energy intake  EDUCATION NEEDS: -Education not appropriate at this time  Dietitian #: [phone]  DOCUMENTATION CODES Per approved criteria  -Severe malnutrition in the context of chronic illness    Orlene Plum 11/29/2011, 1:05 PM

## 2011-11-29 NOTE — Progress Notes (Signed)
Patient left BM in toilet, clear yellow movement noted

## 2011-11-29 NOTE — H&P (View-Only) (Signed)
Referring Provider: McGough, William M, MD Primary Care Physician:  MCGOUGH,WILLIAM M, MD Primary Gastroenterologist:  Dr.Rourk  Chief Complaint  Patient presents with  . Colonoscopy  . weight loss    HPI:  Frank Lin is a 61 y.o. male here as a referral from Dr. McGough for colonoscopy and anemia. He also reports an unintentional 20# wt loss in past 3 months.  He tells me he quit late night snacking so he fell this is why he was initially losing weight, however he continued to lose a significant amount of weight without trying.  He went to [place] to be evaluated.  He had severe weakness & he was dx w/ otitis.  He then became so weak he couold not walk.  He was sent to the lab & found to have anemia.  He received a 2 unit transfusion of blood 1 week ago.  Pretransfusion hemoglobin was 5.7. He also had pancytopenia with a white blood cell count 3.3, platelets 116 , and red blood cells 1.35.  He is now feeling better and really wants to leave for a [place] vacation at [place] in 2 days.   Denies any upper GI symptoms including heartburn, indigestion, nausea, vomiting, dysphagia, odynophagia or anorexia.   Denies any lower GI symptoms including constipation, diarrhea, rectal bleeding, melena.  Hx PUD requiring surgery 1971.  Denies any NSAIDs. He is on aspirin 81 mg daily. He has history of cardiac catheterization with PTCA/stent.  As a side note he had an aortic bruit on exam, however recent aortic ultrasound was benign.  Past Medical History  Diagnosis Date  . Peptic ulcer 1971    Past Surgical History  Procedure Date  . Appendectomy 1970  . Cardiac catheterization 1996    s/p PTCA/stent (Dr Stuckey)  . Stomach surgery 1971    Hx PUD     Current Outpatient Prescriptions  Medication Sig Dispense Refill  . aspirin 81 MG tablet Take 81 mg by mouth daily.      . omeprazole (PRILOSEC) 20 MG capsule Take 1 capsule (20 mg total) by mouth daily.  31 capsule  1    Allergies as of  11/28/2011  . (No Known Allergies)    Family History:There is no known family history of colorectal carcinoma , liver disease, or inflammatory bowel disease.  Problem Relation Age of Onset  . Heart failure Brother     History   Social History  . Marital Status: Married    Spouse Name: N/A    Number of Children: 2  . Years of Education: N/A   Occupational History  .  [employer]    Social History Main Topics  . Smoking status: Current Every Day Smoker -- 0.5 packs/day for 45 years    Types: Cigarettes  . Smokeless tobacco: Not on file  . Alcohol Use: No  . Drug Use: No  . Sexually Active: Not on file  Review of Systems: Gen: See history of present illness. CV: He was having palpitations and shortness of breath. This has resolved status post transfusion. Resp: See above. GI: Denies vomiting blood, jaundice, and fecal incontinence.   GU : Denies urinary burning, blood in urine, urinary frequency, urinary hesitancy, nocturnal urination, and urinary incontinence. MS: Denies joint pain, limitation of movement, and swelling, stiffness, low back pain, extremity pain. Denies muscle weakness, cramps, atrophy.  Derm: Denies rash, itching, dry skin, hives, moles, warts, or unhealing ulcers.  Psych: Denies depression, anxiety, memory loss, suicidal ideation, hallucinations, paranoia,   and confusion. Heme: Denies bruising, bleeding, and enlarged lymph nodes. Neuro:  Denies any headaches or paresthesias. Endo:  Denies any problems with DM, thyroid, adrenal function.  Physical Exam: BP 112/63  Pulse 83  Temp 97.8 F (36.6 C) (Temporal)  Ht 5' 9" (1.753 m)  Wt 158 lb (71.668 kg)  BMI 23.33 kg/m2 No LMP for male patient. General:   Alert,  Well-developed, thin, pleasant and cooperative in NAD Head:  Normocephalic and atraumatic. Eyes:  Sclera clear, no icterus.   Conjunctiva pink. Ears:  Normal auditory acuity. Nose:  No deformity, discharge, or lesions. Mouth:   No deformity or lesions,oropharynx pink & moist. Neck:  Supple; no masses or thyromegaly. Lungs:  Clear throughout to auscultation.   No wheezes, crackles, or rhonchi. No acute distress. Heart:  Regular rate and rhythm; no murmurs, clicks, rubs,  or gallops. Abdomen:  Normal bowel sounds. + Aortic bruit.  Soft, non-tender and non-distended without masses, hepatosplenomegaly or hernias noted.  No guarding or rebound tenderness.   Rectal:  No internal/external lesions palpated. Small amount of medium brown stool was obtained from the vault which is Hemoccult positive. Msk:  Symmetrical without gross deformities. Normal posture. Pulses:  Normal pulses noted. Extremities:  No clubbing or edema. Neurologic:  Alert and oriented x4;  grossly normal neurologically. Skin:  Intact without significant lesions or rashes. Lymph Nodes:  No significant cervical adenopathy. Psych:  Alert and cooperative. Normal mood and affect. 

## 2011-11-29 NOTE — H&P (View-Only) (Signed)
Quick Note:  Discussed results w/ pt. He is at work & feels fine. Prefers no to go to [facility]. Will discuss w/ MD. ______ 

## 2011-11-29 NOTE — Consult Note (Signed)
[facility] [phone]   [phone]  CONSULT NOTE  REASON FOR CONSULTATION:  61 years old white male with pancytopenia.  HPI JAYMES HANG is a 61 y.o. male was past medical history significant for peptic ulcer disease status post surgery in 1971 as well as coronary artery disease status post stent placement in 1996. The patient has been complaining of increasing fatigue and weakness over the last 3 months. It was getting worse. He was seen by his primary care physician recently and was found to have anemia with hemoglobin of 5.8 g/dL. He received 2 units of PRBCs transfusion and was scheduled for gastrointestinal evaluation. He also noted significant weight loss over 20 pounds in the last few months. The patient was admitted to the hospital yesterday who was generalized weakness and fatigue. Repeat CBC yesterday showed the hemoglobin of 6.8 and hematocrit 19.3%, total white blood count 3.0 and platelets count of 76,000. The patient had several other studies performed including HIV antibody test that was nonreactive, LDH was significantly elevated to 2570, iron level was 232, ferritin 238, TSH 1.699, serum folate was normal at 8.4. Serum B12 was very low at 72. The pathologist a smear of the peripheral blood showed borderline high MCV, mild absolute neutropenia with hypersegmentation, moderate thrombocytopenia. I suspect that was suggestive of vitamin B12 deficiency. Stool for Hemoccult was positive. The patient is scheduled for colonoscopy today. He is also scheduled to received 2 units of PRBCs transfusion. He had CT scan of the chest performed yesterday that showed no worrisome summary nodule or mass but there was mild splenomegaly. I was asked to see the patient today for evaluation of his pancytopenia.  He is feeling fine but continues to have fatigue and weakness. He denied having any significant chest pain, shortness breath, cough or hemoptysis. He has  weight loss but no significant night sweats. The patient denied having any epistaxis, hematemesis, hemoptysis or hematochezia. He has no dietary restrictions, and he does not take any nutritional supplements. His family history significant for a father with throat cancer and a mother had breast cancer. The patient is married and has 2 biologic children and 2 step children. He has a history of smoking but quit in 1996. No history of alcohol or drug abuse. He works in [employer] and has some exposure to chemicals. @SFHPI @  Past Medical History  Diagnosis Date  . Peptic ulcer 1971  . CAD in native artery     Past Surgical History  Procedure Date  . Appendectomy 1970  . Cardiac catheterization 1996    s/p PTCA/stent (Dr Riley Kill)  . Stomach surgery 1971    Hx PUD     Family History  Problem Relation Age of Onset  . Heart failure Brother     Social History History  Substance Use Topics  . Smoking status: Former Smoker -- 0.5 packs/day for 45 years    Types: Cigarettes    Quit date: 11/27/1996  . Smokeless tobacco: Not on file  . Alcohol Use: No    No Known Allergies  Current Facility-Administered Medications  Medication Dose Route Frequency Provider Last Rate Last Dose  . 0.9 %  sodium chloride infusion   Intravenous Continuous Joselyn Arrow, NP 20 mL/hr at 11/28/11 1522 20 mL/hr at 11/28/11 1522  . acetaminophen (TYLENOL) tablet 650 mg  650 mg Oral Q6H PRN Christiane Ha, MD       Or  . acetaminophen (TYLENOL) suppository 650  mg  650 mg Rectal Q6H PRN Christiane Ha, MD      . alum & mag hydroxide-simeth (MAALOX/MYLANTA) 200-200-20 MG/5ML suspension 30 mL  30 mL Oral Q6H PRN Christiane Ha, MD      . cyanocobalamin ((VITAMIN B-12)) injection 1,000 mcg  1,000 mcg Intramuscular Daily Christiane Ha, MD   1,000 mcg at 11/29/11 1004  . influenza  inactive virus vaccine (FLUZONE/FLUARIX) injection 0.5 mL  0.5 mL Intramuscular Tomorrow-1000 Christiane Ha, MD      . ondansetron [facility] tablet 4 mg  4 mg Oral Q6H PRN Christiane Ha, MD       Or  . ondansetron (ZOFRAN) injection 4 mg  4 mg Intravenous Q6H PRN Christiane Ha, MD      . pantoprazole (PROTONIX) EC tablet 40 mg  40 mg Oral Q1200 Christiane Ha, MD   40 mg at 11/28/11 1533  . polyethylene glycol (GoLYTELY/NuLYTELY) solution 4,000 mL  4,000 mL Oral Once Joselyn Arrow, NP   4,000 mL at 11/28/11 1706  . sodium chloride 0.9 % injection        10 mL at 11/28/11 1522  . zolpidem (AMBIEN) tablet 5 mg  5 mg Oral QHS PRN Christiane Ha, MD        Review of Systems  A comprehensive review of systems was negative except for: Constitutional: positive for anorexia, fatigue and weight loss  Physical Exam  ZOX:WRUEA, healthy, no distress, well nourished and well developed SKIN: skin color, texture, turgor are normal HEAD: Normocephalic, No masses, lesions, tenderness or abnormalities EYES: normal EARS: External ears normal OROPHARYNX:no exudate and no erythema  NECK: supple, no adenopathy LYMPH:  no palpable lymphadenopathy, no hepatosplenomegaly LUNGS: clear to auscultation  HEART: regular rate & rhythm, no murmurs and no gallops ABDOMEN:abdomen soft, non-tender, normal bowel sounds and no masses or organomegaly BACK: Back symmetric, no curvature. EXTREMITIES:no edema, no skin discoloration, no clubbing  NEURO: alert & oriented x 3 with fluent speech, no focal motor/sensory deficits  PERFORMANCE STATUS: ECOG 1  LABORATORY DATA: Lab Results  Component Value Date   WBC 2.4* 11/29/2011   HGB 6.8* 11/29/2011   HCT 19.6* 11/29/2011   MCV 100.5* 11/29/2011   PLT 77* 11/29/2011    @LASTCHEM @  RADIOGRAPHIC STUDIES: Dg Chest 2 View  11/19/2011  *RADIOLOGY REPORT*  Clinical Data: Malaise and fatigue.  Smoker.  CHEST - 2 VIEW  Comparison: None.  Findings: The heart, mediastinal, and hilar contours are within normal limits.  The lungs are normally expanded.  Mild  peribronchial thickening is present. On the lateral view, a rounded 1.5 cm faint nodular density projects over a lower thoracic spine vertebral body; a pulmonary nodule cannot be excluded.  Negative for pleural effusion.  Trachea midline.  No acute bony abnormality.  IMPRESSION: Cannot exclude a pulmonary nodule at the lung bases on the lateral view.  Faint 1.5 cm nodular density projects over the lumbar spine. Further evaluation with chest CT is suggested. CT could be performed without contrast for the purposes of evaluating for a pulmonary nodule.  These results will be called to the ordering clinician or representative by the Radiologist Assistant, and communication documented in the PACS Dashboard.   Original Report Authenticated By: Britta Mccreedy, M.D.    Ct Chest Wo Contrast  11/20/2011  *RADIOLOGY REPORT*  Clinical Data: Abnormal chest x-ray.  CT CHEST WITHOUT CONTRAST  Technique:  Multidetector CT imaging of the chest was performed following the  standard protocol without IV contrast.  Comparison: Chest x-ray 11/19/2011.  Findings: The chest wall is unremarkable.  No supraclavicular or axillary lymphadenopathy.  The thyroid gland appears normal.  The bony thorax is intact.  No destructive bone lesions or spinal canal compromise. Moderate osteoporosis is noted.  The heart is normal in size.  No pericardial effusion.  Small scattered mediastinal and hilar lymph nodes but no lymphadenopathy. The aorta is normal in caliber.  Mild atherosclerotic calcifications but no focal aneurysm.  Coronary artery calcifications are noted.  The esophagus is grossly normal. Surgical changes are noted near the GE junction.  Examination of the lung parenchyma demonstrates no acute pulmonary findings.  No worrisome pulmonary nodule or mass.  Emphysematous changes are noted.  No pleural effusion.  The upper abdomen demonstrates a mild splenomegaly.  Surgical changes noted near the GE junction.  IMPRESSION:  1.  No worrisome  pulmonary nodule or mass is identified. 2.  Emphysematous changes. 3.  Surgical changes noted at the GE junction. 4.  Query mild splenomegaly.  The entire spleen is not imaged.   Original Report Authenticated By: P. Loralie Champagne, M.D.    US Carotid Duplex Bilateral  11/21/2011  *RADIOLOGY REPORT*  Clinical Data: Right carotid bruit, hypertension and tobacco use. History of coronary artery disease.  BILATERAL CAROTID DUPLEX ULTRASOUND  Technique: Wallace Cullens scale imaging, color Doppler and duplex ultrasound was performed of bilateral carotid and vertebral arteries in the neck.  Comparison:  None.  Criteria:  Quantification of carotid stenosis is based on velocity parameters that correlate the residual internal carotid diameter with NASCET-based stenosis levels, using the diameter of the distal internal carotid lumen as the denominator for stenosis measurement.  The following velocity measurements were obtained:                   PEAK SYSTOLIC/END DIASTOLIC RIGHT ICA:                        132/35cm/sec CCA:                        157/35cm/sec SYSTOLIC ICA/CCA RATIO:     0.8 DIASTOLIC ICA/CCA RATIO:    1.0 ECA:                        100cm/sec  LEFT ICA:                        132/35cm/sec CCA:                        158/32cm/sec SYSTOLIC ICA/CCA RATIO:     0.8 DIASTOLIC ICA/CCA RATIO:    1.1 ECA:                        132cm/sec  Findings:  RIGHT CAROTID ARTERY: A minimal amount of partially calcified plaque is present at the level of the carotid bulb.  Minimal plaque extends to the near the ICA origin.  Estimated ICA stenosis is less than 50%.  RIGHT VERTEBRAL ARTERY:  Antegrade flow with normal wave form.  LEFT CAROTID ARTERY: No significant plaque identified.  There may be a tiny amount of noncalcified plaque at the level of the distal bulb.  No ICA plaque or stenosis identified.  LEFT VERTEBRAL ARTERY:  Antegrade flow with normal wave form.  IMPRESSION: Minimal amount  of plaque at the level of the carotid bulbs  without evidence of significant carotid stenosis.   Original Report Authenticated By: Reola Calkins, M.D.    US Aorta  11/21/2011  *RADIOLOGY REPORT*  Clinical Data:  Hypertension.  Evaluate for abdominal aortic aneurysm.  ULTRASOUND OF ABDOMINAL AORTA  Technique:  Ultrasound examination of the abdominal aorta was performed to evaluate for abdominal aortic aneurysm.  Comparison: None.  Abdominal Aorta:  No aneurysm identified.  Mild atherosclerotic plaque noted.        Maximum AP diameter:  2.8 cm.       Maximum TRV diameter:  2.6 cm.  IMPRESSION: No abdominal aortic aneurysm identified.   Original Report Authenticated By: Danae Orleans, M.D.     ASSESSMENT: This is a very pleasant 61 years old white male with pancytopenia and mild splenomegaly. His pancytopenia most likely secondary to vitamin B12 deficiency but other etiologies like lymphoma or leukemia cannot be excluded at this point.  PLAN: I have a lengthy discussion with the patient and his wife today about his condition and further investigation to identify the etiology for his pancytopenia and splenomegaly. I recommended for the patient to proceed with a bone marrow biopsy and aspirate. My plan was to do the procedure today but unfortunately because of logistic issues and unavailability of a lab technician, this would be delayed until next week. For the vitamin B12 deficiency, I would recommend starting the patient on daily vitamin B12 injection for at least one week then weekly for 4 weeks then monthly after that. I agree with the colonoscopy today to rule out any bleeding issues. The patient may also benefit from upper endoscopy to evaluate for her pernicious anemia. I also agree with PRBCs transfusion to keep his hemoglobin of 8 g/dL. The patient will be followed by Dr. [name] on an inpatient basis if he still in the hospital by Monday or on outpatient basis if he was discharged this weekend. I giae the patient and his wife the time  to ask questions and I answered them completely to their satisfaction.   All questions were answered. The patient knows to call the clinic with any problems, questions or concerns. We can certainly see the patient much sooner if necessary.  Thank you so much for allowing me to participate in the care of DELSHON BLANCHFIELD. I will continue to follow up the patient with you and assist in his care.  I spent 30 minutes counseling the patient face to face. The total time spent in the appointment was 60 minutes.  Quron Ruddy K. 11/29/2011, 10:35 AM

## 2011-11-29 NOTE — Op Note (Signed)
[facility] [address] [place], [address]   COLONOSCOPY PROCEDURE REPORT  PATIENT: Frank, Lin  MR#:         [id number] BIRTHDATE: 04-04-1950 , 61  yrs. old GENDER: Male ENDOSCOPIST: R.  Roetta Sessions, MD FACP FACG REFERRED BY:  Glenford Peers, M.D. PROCEDURE DATE:  11/29/2011 PROCEDURE:     Colonoscopy with biopsy, polyp ablation and snare polypectomy  INDICATIONS: Hemoccult-positive stool profound anemia  INFORMED CONSENT:  The risks, benefits, alternatives and imponderables including but not limited to bleeding, perforation as well as the possibility of a missed lesion have been reviewed.  The potential for biopsy, lesion removal, etc. have also been discussed.  Questions have been answered.  All parties agreeable. Please see the history and physical in the medical record for more information.  MEDICATIONS: Versed 6mg  IV and Demerol 125 mg IV in divided doses.  DESCRIPTION OF PROCEDURE:  After a digital rectal exam was performed, the [id number] ([id number])  colonoscope was advanced from the anus through the rectum and colon to the area of the cecum, ileocecal valve and appendiceal orifice.  The cecum was deeply intubated.  These structures were well-seen and photographed for the record.  From the level of the cecum and ileocecal valve, the scope was slowly and cautiously withdrawn.  The mucosal surfaces were carefully surveyed utilizing scope tip deflection to facilitate fold flattening as needed.  The scope was pulled down into the rectum where a thorough examination including retroflexion was performed.    FINDINGS:  Marginal prep. Normal rectum. This gentlemans sigmoid, descending, transverse, and ascending segment to the cecum were loaded with colonic polyps. The patient had multiple multilobulated 3x5 cm polyps in the ascending segment, at the hepatic flexure, splenic flexure and descending segments. There was a carpet polyp opposite the  ileocecal valve. He also had multiple 5-8 mm polyps throughout the ascending and transverse segments. The patient had multiple sigmoid polyps approximately 1 cm in dimension as well.   THERAPEUTIC / DIAGNOSTIC MANEUVERS PERFORMED: Biopsies of the largest polyps in the ascending, hepatic flexure, splenic flexure and descending segments were taken for the pathologist. No attempted at removal were made. The pedunculated sigmoid polyps were hot snare removed. There were a couple of adjacent diminutive polyps which were ablated with the tip of the hot snare loop. The sigmoid segment was felt to be cleared of polyps. Again, the rectal mucosa appeared normal.     COMPLICATIONS: none  CECAL WITHDRAWAL TIME:  27 minutes  IMPRESSION:  Multiple colonic polyps, particularly in the ascending, transverse and descending segments. It would take serial colonoscopies over a period of time to completely clear his colon of all of the polyps, if they were all completely resectable endoscopically. . Given the multiplicity of large polyps, I feel he would be best served by undergoing a subtotal colectomy leaving him with some sigmoid and rectum. The pathology report on the specimens submitted this evening will help guide decision making. I have discussed this evening's findings with the patient's wife in the endoscopy unit.  RECOMMENDATIONS: Advance to a heart healthy diet. Followup on labs tomorrow morning. Followup on pathology.  Fully agree with hematology evaluation.  See EGD report    _______________________________ eSigned:  R. Roetta Sessions, MD FACP [facility] 11/29/2011 7:18 PM   CC:    PATIENT NAME:  Frank, Lin MR#: [id number]

## 2011-11-29 NOTE — Interval H&P Note (Signed)
History and Physical Interval Note:  11/29/2011 5:33 PM  Frank Lin  has presented today for surgery, with the diagnosis of anemia, heme positive stool  The various methods of treatment have been discussed with the patient and family. After consideration of risks, benefits and other options for treatment, the patient has consented to  Procedure(s) (LRB) with comments: COLONOSCOPY WITH ESOPHAGOGASTRODUODENOSCOPY (EGD) (N/A) as a surgical intervention .  The patient's history has been reviewed, patient examined, no change in status, stable for surgery.  I have reviewed the patient's chart and labs.  Questions were answered to the patient's satisfaction.     Eula Listen

## 2011-11-29 NOTE — Progress Notes (Signed)
Discussed with Jenita Seashore and Tana Coast.  Subjective: Requesting something to drink. Reports that he has been having night sweats for several months.  Objective: Vital signs in last 24 hours: Filed Vitals:   11/28/11 1937 11/28/11 2026 11/28/11 2100 11/29/11 0638  BP: 117/68 114/65 117/60 117/73  Pulse: 72 75 77 70  Temp: 98.1 F (36.7 C) 98 F (36.7 C) 97.9 F (36.6 C) 98.2 F (36.8 C)  TempSrc: Oral Oral Oral Oral  Resp: 18 18 18 18   Height:      Weight:      SpO2: 97% 98% 98% 98%   Weight change:   Intake/Output Summary (Last 24 hours) at 11/29/11 0958 Last data filed at 11/29/11 0800  Gross per 24 hour  Intake   1650 ml  Output      0 ml  Net   1650 ml   Exam unchanged from 11/28/2011  Lab Results: Basic Metabolic Panel:  Lab 11/28/11 9604  NA 139  K 4.0  CL 108  CO2 28  GLUCOSE 142*  BUN 13  CREATININE 0.71  CALCIUM 8.9  MG --  PHOS --   Liver Function Tests:  Lab 11/28/11 0837  AST 40*  ALT 27  ALKPHOS 80  BILITOT 2.5*  PROT 6.4  ALBUMIN 4.2    CBC:  Lab 11/29/11 0444 11/28/11 1615 11/28/11 0837  WBC 2.4* 2.6* --  NEUTROABS -- 1.4* 1.5*  HGB 6.8* 6.1* --  HCT 19.6* 17.4* --  MCV 100.5* 104.2* --  PLT 77* 70* --   LDH 2570  Thyroid Function Tests:  Lab 11/28/11 0837  TSH 1.699  T4TOTAL --  FREET4 --  T3FREE --  THYROIDAB --   Coagulation:  Lab 11/28/11 1615  LABPROT 16.8*  INR 1.34   Anemia Panel:  Lab 11/28/11 0837  VITAMINB12 72*  FOLATE 8.4  FERRITIN 238  TIBC NOT CALC  IRON 232*  RETICCTPCT --   Urine Drug Screen: Drugs of Abuse  No results found for this basename: labopia, cocainscrnur, labbenz, amphetmu, thcu, labbarb    Alcohol Level: No results found for this basename: ETH:2 in the last 168 hours Urinalysis:  Lab 11/28/11 1506  COLORURINE YELLOW  LABSPEC <1.005*  PHURINE 5.5  GLUCOSEU NEGATIVE  HGBUR NEGATIVE  BILIRUBINUR NEGATIVE  KETONESUR NEGATIVE  PROTEINUR NEGATIVE  UROBILINOGEN 0.2    NITRITE NEGATIVE  LEUKOCYTESUR NEGATIVE   Scheduled Meds:   . cyanocobalamin  1,000 mcg Intramuscular Daily  . influenza  inactive virus vaccine  0.5 mL Intramuscular Tomorrow-1000  . pantoprazole  40 mg Oral Q1200  . polyethylene glycol  4,000 mL Oral Once  . sodium chloride       Continuous Infusions:   . sodium chloride 20 mL/hr (11/28/11 1522)   PRN Meds:.acetaminophen, acetaminophen, alum & mag hydroxide-simeth, ondansetron (ZOFRAN) IV, ondansetron, zolpidem Assessment/Plan: Active Problems:  Unintentional weight loss  Heme positive stool  Pancytopenia  B12 deficiency  Tobacco use disorder  PUD (peptic ulcer disease) requiring surgery in 1971 Heart disease with history of stent, stable. Aspirin has been held. Elevated LDH Mildly elevated bilirubin:  Certainly, B12 deficiency (from previous gastric surgery) is contributing to patient's pancytopenia.  However, his weight loss, elevated LDH, night sweats, possible splenomegaly seen on CT chest several weeks ago are concerning for malignancy. Patient will have upper and lower endoscopy today. Also, Dr. [name] is covering for Dr. [name] and is able to do an inpatient consult. Will leave any further workup of the pancytopenia to  Dr. [name]. Continue empiric protonix. Will transfuse another unit of packed red blood cells. Will order fractionated bilirubin. Cyanocobalamin injections.    LOS: 1 day   Klaudia Beirne L 11/29/2011, 9:58 AM

## 2011-11-29 NOTE — Op Note (Signed)
[facility] [address] [place], [address]   ENDOSCOPY PROCEDURE REPORT  PATIENT: Frank Lin, Frank Lin  MR#: [id number] BIRTHDATE: December 11, 1950 , 61  yrs. old GENDER: Male ENDOSCOPIST: R.  Roetta Sessions, MD FACP FACG REFERRED BY:  Glenford Peers, M.D. PROCEDURE DATE:  11/29/2011 PROCEDURE:     EGD with gastric and small bowel biopsy  INDICATIONS:     profound anemia / pancytopenia-Hemoccult-positive  INFORMED CONSENT:   The risks, benefits, limitations, alternatives and imponderables have been discussed.  The potential for biopsy, esophogeal dilation, etc. have also been reviewed.  Questions have been answered.  All parties agreeable.  Please see the history and physical in the medical record for more information.  MEDICATIONS:    Versed 4 mg IV and Demerol 75 mg IV in divided doses.  DESCRIPTION OF PROCEDURE:   The Pentax Gastroscope [id number] endoscope was introduced through the mouth and advanced to the second portion of the duodenum without difficulty or limitations. The mucosal surfaces were surveyed very carefully during advancement of the scope and upon withdrawal.  Retroflexion view of the proximal stomach and esophagogastric junction was performed.      FINDINGS:  Normal esophagus. 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 Patient had a single efferent limb.  Polypoid mucosa involving the proximal small bowel just distal to the anastomosis with stomach. No ulcer or infiltrating process seen.  Residual gastric mucosa appeared normal.  THERAPEUTIC / DIAGNOSTIC MANEUVERS PERFORMED:  biopsies of the abnormal-appearing polypoid small bowel mucosa taken. Also biopsies of the gastric mucosa taken to evaluate for H. pylori.   COMPLICATIONS:  None  IMPRESSION:  Normal esophagus. Status post prior gastric surgery as described above. Abnormal proximal small bowel status post biopsy. Status post gastric  biopsy.  RECOMMENDATIONS:  Followup on pathology.  See colonoscopy report.    _______________________________ R. Roetta Sessions, MD FACP [facility] eSigned:  R. Roetta Sessions, MD FACP [facility] 11/29/2011 6:01 PM     CC:

## 2011-11-30 ENCOUNTER — Encounter (HOSPITAL_COMMUNITY): Payer: Self-pay | Admitting: Internal Medicine

## 2011-11-30 DIAGNOSIS — K635 Polyp of colon: Secondary | ICD-10-CM | POA: Diagnosis present

## 2011-11-30 DIAGNOSIS — R195 Other fecal abnormalities: Secondary | ICD-10-CM

## 2011-11-30 DIAGNOSIS — D126 Benign neoplasm of colon, unspecified: Secondary | ICD-10-CM

## 2011-11-30 DIAGNOSIS — D649 Anemia, unspecified: Secondary | ICD-10-CM

## 2011-11-30 DIAGNOSIS — D72819 Decreased white blood cell count, unspecified: Secondary | ICD-10-CM | POA: Diagnosis present

## 2011-11-30 HISTORY — DX: Polyp of colon: K63.5

## 2011-11-30 HISTORY — DX: Decreased white blood cell count, unspecified: D72.819

## 2011-11-30 LAB — CBC
HCT: 21.6 % — ABNORMAL LOW (ref 39.0–52.0)
Platelets: 75 10*3/uL — ABNORMAL LOW (ref 150–400)
RBC: 2.25 MIL/uL — ABNORMAL LOW (ref 4.22–5.81)
RDW: 24.3 % — ABNORMAL HIGH (ref 11.5–15.5)
WBC: 2.8 10*3/uL — ABNORMAL LOW (ref 4.0–10.5)

## 2011-11-30 MED ORDER — CYANOCOBALAMIN 1000 MCG/ML IJ SOLN: INTRAMUSCULAR | Status: DC

## 2011-11-30 MED ORDER — ONE-DAILY MULTI VITAMINS PO TABS: 1.0000 | ORAL_TABLET | Freq: Every day | ORAL | Status: DC

## 2011-11-30 NOTE — Progress Notes (Signed)
   Subjective:  Feels well. Tolerating diet. Wants to go home. Denies rectal bleeding.  Vital signs in last 24 hours: Temp:  [97.5 F (36.4 C)-98.7 F (37.1 C)] 98.1 F (36.7 C) (09/14 0500) Pulse Rate:  [66-79] 74  (09/14 0500) Resp:  [12-18] 16  (09/14 0500) BP: (96-155)/(46-74) 96/46 mmHg (09/14 0500) SpO2:  [94 %-99 %] 94 % (09/14 0500) Last BM Date: 11/29/11 General:   Alert,  Well-developed, well-nourished, pleasant and cooperative in NAD Abdomen:  Soft, nontender and nondistended.  Normal bowel sounds, without guarding, and without rebound.  No mass or organomegaly. Extremities:  Without clubbing or edema.    Intake/Output from previous day: 09/13 0701 - 09/14 0700 In: 843.2 [I.V.:843.2] Out: -  Intake/Output this shift:    Lab Results:  Basename 11/30/11 0655 11/29/11 0444 11/28/11 1615  WBC 2.8* 2.4* 2.6*  HGB 7.6* 6.8* 6.1*  HCT 21.6* 19.6* 17.4*  PLT 75* 77* 70*   BMET  Basename 11/28/11 0837  NA 139  K 4.0  CL 108  CO2 28  GLUCOSE 142*  BUN 13  CREATININE 0.71  CALCIUM 8.9   LFT  Basename 11/29/11 0444 11/28/11 0837  PROT -- 6.4  ALBUMIN -- 4.2  AST -- 40*  ALT -- 27  ALKPHOS -- 80  BILITOT 1.8* --  BILIDIR 0.2 --  IBILI 1.6* --   PT/INR  Basename 11/28/11 1615  LABPROT 16.8*  INR 1.34    Impression/recommendations: Doing well from a GI standpoint. His colon is loaded with large polyps. The best approach for management here is likely going to be a subtotal colectomy. He is tolerating his anemia very well. I feel the majority his anemia is due to a primary hematological problem. He's tolerating his anemia very well at this time.  From a GI standpoint, I feel it would not be unreasonable to let patient go home over the weekend with early followup next week. Biopsies should be available by Tuesday of next week. Further recommendations to follow from me once the biopsies are available for review.

## 2011-11-30 NOTE — Progress Notes (Signed)
Patient with orders to be discharge home. Discharge instructions given, patient verbalized understanding. Prescriptions given. Patient in stable condition upon discharge. Patient left via private vehicle with family.

## 2011-11-30 NOTE — Discharge Summary (Addendum)
Physician Discharge Summary  Frank Lin FAO:[id number] DOB: [birth year] DOA: 11/28/2011  PCP: Frank Ruths, MD  Admit date: 11/28/2011 Discharge date: 11/30/2011  Recommendations for Outpatient Follow-up:  1. The patient was discharged to home in stable condition. Arrangements will be made for him to followup with Dr. Mariel Lin and Dr. Jena Lin.  Discharge Diagnoses:   1. Pancytopenia, of unknown etiology. 2. Macrocytic anemia. The patient's hemoglobin was 6.8 on admission and 7.6 at the time of discharge, status post 2 units of packed red blood cell transfusions. 3. Vitamin B12 deficiency. The patient's vitamin B12 level was 72. 4. Leukopenia. The patient's white blood cell count was 3.0 on admission and 2.8 at the time of discharge. 5. Thrombocytopenia. The patient's platelet count was 76 on admission and 75 at the time of discharge. 6. Large diffuse colon polyps in the sigmoid, descending, transverse, and ascending colon, per colonoscopy by Dr. Jena Lin on 11/29/2011. Status post polyp ablation and snare polypectomy. Biopsy results pending at the time of discharge. 7. EGD on 11/29/2011 per Dr. 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 Status post gastric biopsy. 8. History of peptic ulcer disease. 9. Tobacco abuse. The patient was advised to stop smoking. 10. Unintentional weight loss. 11. Mild hyperbilirubinemia and elevated AST. 12. Query splenomegaly. 13. Severe malnutrition.   Discharge Condition: Stable.  Diet recommendation: Heart healthy.  Filed Weights   11/28/11 1303  Weight: 70.761 kg (156 lb)    History of present illness:  The patient is a 61 year old man who was directly admitted from Dr. Luvenia Lin office with pancytopenia and unintentional 20 pound weight loss. He  had also been feeling poorly for several months with malaise, fatigue, myalgias, and dyspnea on exertion. He was evaluated by his primary care physician initially. The workup revealed significant anemia with a hemoglobin of 5.8. The CT of his chest revealed no mass but it did show possible splenomegaly, though the spleen was incompletely imaged. On admission, the patient was afebrile and hemodynamically stable. His lab data were significant for a WBC of 3.0, hemoglobin of 6.8, hematocrit of 19.3, MCV of 105.5, and platelet count of 76. He was admitted for further evaluation and management.  Hospital Course:  The patient was typed and crossed 2 units of packed red blood cells. Both units were transfused over the first 24-48 hours of hospitalization. He was started on Protonix empirically given his history of gastric surgery for peptic ulcer disease. For further evaluation, a number of studies were ordered. His total iron was elevated at 232, ferritin was within normal limits at 238, folate was 8.4, and vitamin B12 level was low at 72. He was started on vitamin B12 supplementation with IM injections. His PT was 16.8 and his PTT was 33. His TSH was within normal limits at 1.7. HIV was nonreactive. His liver transaminases were within normal limits with exception of a slightly elevated AST of 40. His total bilirubin was elevated at 2.5, but was 1.8 the following day. Indirect bilirubin was 1.6 and direct was 0.2. LDH was 2570. His stool was guaiac positive. Both gastroenterologist, Dr. Jena Lin and hematologist/oncologist Dr. Shirline Lin were consulted. Following Dr. Luvenia Lin evaluation, he proceeded with an EGD and colonoscopy. The results of both were dictated above. Biopsies were obtained. The results were pending at the time of discharge. Per Dr. Rebecca Lin assessment, the patient needed further  investigation to identify the etiology of his pancytopenia and splenomegaly. He recommended a bone marrow biopsy and aspirate.  He had planned to perform the procedures, however, because of logistic issues and the unavailability of a lab technician, it was postponed. He will contact Dr. Mariel Lin next week for further management regarding this patient. He agreed with vitamin B12 injections. He advised daily vitamin B12 injections for at least one week and then weekly for 4 weeks and then monthly after that.   Following the start of the vitamin B12 injections and packed red blood cell transfusions, he improved clinically and symptomatically. His hemoglobin improved to 7.6. His MCV decreased to 96. His white blood cell count and platelet count remained about the same. Per the followup evaluation by gastroenterologist Dr. Jena Lin, he felt it was reasonable to discharge the patient to home over the weekend given that the patient was tolerating his hemoglobin of 7.6. He would followup with the patient next week regarding the results of the biopsies. The dictating physician discussed followup with hematology/oncology and his primary care physician. I informed him and his family that oncology will be notified about his discharge and followup will be arranged by them. Also, I gave him a prescription for vitamin B12 to be given intramuscularly. Instructions were given as per the recommendations by Dr. Shirline Lin. Both he and his family voiced understanding.   Procedures:  EGD and colonoscopy.  Consultations:  Frank Lin, M.D.  Frank Lin, M.D.  Discharge Exam: Filed Vitals:   11/29/11 1900 11/29/11 1905 11/29/11 1953 11/30/11 0500  BP: 104/57 130/59 143/74 96/46  Pulse: 73 73 70 74  Temp:   97.5 F (36.4 C) 98.1 F (36.7 C)  TempSrc:   Oral Oral  Resp: 16 17 16 16   Height:      Weight:      SpO2: 97% 96% 95% 94%    General: Alert 61 year old Caucasian man sitting up in bed, in no acute distress. Cardiovascular: S1, S2, with no murmurs rubs or gallops. Respiratory: Clear to auscultation bilaterally. Abdomen:  Positive bowel sounds, soft, nontender, nondistended. Extremities: Trace of pedal edema.  Discharge Instructions  Discharge Orders    Future Orders Please Complete By Expires   Diet - low sodium heart healthy      Increase activity slowly          Medication List     As of 11/30/2011 11:33 AM    STOP taking these medications         aspirin 81 MG tablet      TAKE these medications         cyanocobalamin 1000 MCG/ML injection   Commonly known as: (VITAMIN B-12)   Give one injection intramuscularly daily for the next 5 days; then one injection once weekly for 4 weeks; and then one injection monthly until further instructed by your primary or specialist physician.      multivitamin tablet   Take 1 tablet by mouth daily.           Follow-up Information    Follow up with Eula Listen, MD. (His office will call you.)    Contact information:   [address] [place] [address] [phone]       Follow up with Randall An, MD. (His office will call you.)    Contact information:   [address] [place] [address] [phone]       Follow up with Frank Ruths, MD. Schedule  an appointment as soon as possible for a visit in 2 weeks.   Contact information:   [address] [place] [address] [phone]           The results of significant diagnostics from this hospitalization (including imaging, microbiology, ancillary and laboratory) are listed below for reference.    Significant Diagnostic Studies: Dg Chest 2 View  11/19/2011  *RADIOLOGY REPORT*  Clinical Data: Malaise and fatigue.  Smoker.  CHEST - 2 VIEW  Comparison: None.  Findings: The heart, mediastinal, and hilar contours are within normal limits.  The lungs are normally expanded.  Mild peribronchial thickening is present. On the lateral view, a rounded 1.5 cm faint nodular density projects over a lower thoracic spine vertebral body;  a pulmonary nodule cannot be excluded.  Negative for pleural effusion.  Trachea midline.  No acute bony abnormality.  IMPRESSION: Cannot exclude a pulmonary nodule at the lung bases on the lateral view.  Faint 1.5 cm nodular density projects over the lumbar spine. Further evaluation with chest CT is suggested. CT could be performed without contrast for the purposes of evaluating for a pulmonary nodule.  These results will be called to the ordering clinician or representative by the Radiologist Assistant, and communication documented in the PACS Dashboard.   Original Report Authenticated By: Britta Mccreedy, M.D.    Ct Chest Wo Contrast  11/20/2011  *RADIOLOGY REPORT*  Clinical Data: Abnormal chest x-ray.  CT CHEST WITHOUT CONTRAST  Technique:  Multidetector CT imaging of the chest was performed following the standard protocol without IV contrast.  Comparison: Chest x-ray 11/19/2011.  Findings: The chest wall is unremarkable.  No supraclavicular or axillary lymphadenopathy.  The thyroid gland appears normal.  The bony thorax is intact.  No destructive bone lesions or spinal canal compromise. Moderate osteoporosis is noted.  The heart is normal in size.  No pericardial effusion.  Small scattered mediastinal and hilar lymph nodes but no lymphadenopathy. The aorta is normal in caliber.  Mild atherosclerotic calcifications but no focal aneurysm.  Coronary artery calcifications are noted.  The esophagus is grossly normal. Surgical changes are noted near the GE junction.  Examination of the lung parenchyma demonstrates no acute pulmonary findings.  No worrisome pulmonary nodule or mass.  Emphysematous changes are noted.  No pleural effusion.  The upper abdomen demonstrates a mild splenomegaly.  Surgical changes noted near the GE junction.  IMPRESSION:  1.  No worrisome pulmonary nodule or mass is identified. 2.  Emphysematous changes. 3.  Surgical changes noted at the GE junction. 4.  Query mild splenomegaly.  The entire  spleen is not imaged.   Original Report Authenticated By: P. Loralie Champagne, M.D.    US Carotid Duplex Bilateral  11/21/2011  *RADIOLOGY REPORT*  Clinical Data: Right carotid bruit, hypertension and tobacco use. History of coronary artery disease.  BILATERAL CAROTID DUPLEX ULTRASOUND  Technique: Wallace Cullens scale imaging, color Doppler and duplex ultrasound was performed of bilateral carotid and vertebral arteries in the neck.  Comparison:  None.  Criteria:  Quantification of carotid stenosis is based on velocity parameters that correlate the residual internal carotid diameter with NASCET-based stenosis levels, using the diameter of the distal internal carotid lumen as the denominator for stenosis measurement.  The following velocity measurements were obtained:                   PEAK SYSTOLIC/END DIASTOLIC RIGHT ICA:  132/35cm/sec CCA:                        157/35cm/sec SYSTOLIC ICA/CCA RATIO:     0.8 DIASTOLIC ICA/CCA RATIO:    1.0 ECA:                        100cm/sec  LEFT ICA:                        132/35cm/sec CCA:                        158/32cm/sec SYSTOLIC ICA/CCA RATIO:     0.8 DIASTOLIC ICA/CCA RATIO:    1.1 ECA:                        132cm/sec  Findings:  RIGHT CAROTID ARTERY: A minimal amount of partially calcified plaque is present at the level of the carotid bulb.  Minimal plaque extends to the near the ICA origin.  Estimated ICA stenosis is less than 50%.  RIGHT VERTEBRAL ARTERY:  Antegrade flow with normal wave form.  LEFT CAROTID ARTERY: No significant plaque identified.  There may be a tiny amount of noncalcified plaque at the level of the distal bulb.  No ICA plaque or stenosis identified.  LEFT VERTEBRAL ARTERY:  Antegrade flow with normal wave form.  IMPRESSION: Minimal amount of plaque at the level of the carotid bulbs without evidence of significant carotid stenosis.   Original Report Authenticated By: Reola Calkins, M.D.    US Aorta  11/21/2011  *RADIOLOGY REPORT*   Clinical Data:  Hypertension.  Evaluate for abdominal aortic aneurysm.  ULTRASOUND OF ABDOMINAL AORTA  Technique:  Ultrasound examination of the abdominal aorta was performed to evaluate for abdominal aortic aneurysm.  Comparison: None.  Abdominal Aorta:  No aneurysm identified.  Mild atherosclerotic plaque noted.        Maximum AP diameter:  2.8 cm.       Maximum TRV diameter:  2.6 cm.  IMPRESSION: No abdominal aortic aneurysm identified.   Original Report Authenticated By: Danae Orleans, M.D.     Microbiology: No results found for this or any previous visit (from the past 240 hour(s)).   Labs: Basic Metabolic Panel:  Lab 11/28/11 1610  NA 139  K 4.0  CL 108  CO2 28  GLUCOSE 142*  BUN 13  CREATININE 0.71  CALCIUM 8.9  MG --  PHOS --   Liver Function Tests:  Lab 11/29/11 0444 11/28/11 0837  AST -- 40*  ALT -- 27  ALKPHOS -- 80  BILITOT 1.8* 2.5*  PROT -- 6.4  ALBUMIN -- 4.2   No results found for this basename: LIPASE:5,AMYLASE:5 in the last 168 hours No results found for this basename: AMMONIA:5 in the last 168 hours CBC:  Lab 11/30/11 0655 11/29/11 0444 11/28/11 1615 11/28/11 0837  WBC 2.8* 2.4* 2.6* 3.0*  NEUTROABS -- -- 1.4* 1.5*  HGB 7.6* 6.8* 6.1* 6.8*  HCT 21.6* 19.6* 17.4* 19.3*  MCV 96.0 100.5* 104.2* 105.5*  PLT 75* 77* 70* 76*   Cardiac Enzymes: No results found for this basename: CKTOTAL:5,CKMB:5,CKMBINDEX:5,TROPONINI:5 in the last 168 hours BNP: BNP (last 3 results) No results found for this basename: PROBNP:3 in the last 8760 hours CBG: No results found for this basename: GLUCAP:5 in the last 168 hours  Time coordinating discharge: Greater  than 30 minutes  Signed:  Khalis Hittle  [facility] 11/30/2011, 11:33 AM

## 2011-12-02 ENCOUNTER — Telehealth (HOSPITAL_COMMUNITY): Payer: Self-pay | Admitting: Oncology

## 2011-12-02 ENCOUNTER — Other Ambulatory Visit (HOSPITAL_COMMUNITY): Payer: Self-pay | Admitting: Oncology

## 2011-12-02 DIAGNOSIS — D649 Anemia, unspecified: Secondary | ICD-10-CM

## 2011-12-02 LAB — TYPE AND SCREEN
ABO/RH(D): O POS
Unit division: 0

## 2011-12-03 ENCOUNTER — Encounter (HOSPITAL_BASED_OUTPATIENT_CLINIC_OR_DEPARTMENT_OTHER): Payer: BC Managed Care – PPO

## 2011-12-03 DIAGNOSIS — D649 Anemia, unspecified: Secondary | ICD-10-CM

## 2011-12-03 LAB — LACTATE DEHYDROGENASE: LDH: 1158 U/L — ABNORMAL HIGH (ref 94–250)

## 2011-12-03 LAB — RETICULOCYTES
Retic Count, Absolute: 266 10*3/uL — ABNORMAL HIGH (ref 19.0–186.0)
Retic Ct Pct: 10 % — ABNORMAL HIGH (ref 0.4–3.1)

## 2011-12-03 LAB — CBC WITH DIFFERENTIAL/PLATELET
Hemoglobin: 9 g/dL — ABNORMAL LOW (ref 13.0–17.0)
Lymphs Abs: 1.3 10*3/uL (ref 0.7–4.0)
Monocytes Relative: 13 % — ABNORMAL HIGH (ref 3–12)
Neutro Abs: 1.5 10*3/uL — ABNORMAL LOW (ref 1.7–7.7)
Neutrophils Relative %: 45 % (ref 43–77)
RBC: 2.66 MIL/uL — ABNORMAL LOW (ref 4.22–5.81)

## 2011-12-03 LAB — COMPREHENSIVE METABOLIC PANEL
Albumin: 3.4 g/dL — ABNORMAL LOW (ref 3.5–5.2)
Alkaline Phosphatase: 85 U/L (ref 39–117)
BUN: 9 mg/dL (ref 6–23)
CO2: 25 mEq/L (ref 19–32)
Chloride: 105 mEq/L (ref 96–112)
GFR calc Af Amer: >90 mL/min (ref >90)
Glucose, Bld: 116 mg/dL — ABNORMAL HIGH (ref 70–99)
Potassium: 3.7 mEq/L (ref 3.5–5.1)
Total Bilirubin: 1.1 mg/dL (ref 0.3–1.2)

## 2011-12-03 NOTE — Progress Notes (Signed)
Labs drawn today for cbc/diff,cmp,Iron and IBC, ferr,methylmalonic acid,ldh, retic

## 2011-12-04 ENCOUNTER — Other Ambulatory Visit (HOSPITAL_COMMUNITY): Payer: Self-pay | Admitting: Oncology

## 2011-12-04 DIAGNOSIS — D649 Anemia, unspecified: Secondary | ICD-10-CM

## 2011-12-04 LAB — FERRITIN: Ferritin: 110 ng/mL (ref 22–322)

## 2011-12-04 LAB — IRON AND TIBC: Iron: 22 ug/dL — ABNORMAL LOW (ref 42–135)

## 2011-12-05 ENCOUNTER — Telehealth: Payer: Self-pay | Admitting: Internal Medicine

## 2011-12-05 NOTE — Telephone Encounter (Signed)
Please call patient at [phone]. He was checking on his results from last week.

## 2011-12-06 ENCOUNTER — Telehealth: Payer: Self-pay | Admitting: Internal Medicine

## 2011-12-06 NOTE — Telephone Encounter (Signed)
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 [name]  next week

## 2011-12-09 ENCOUNTER — Encounter (HOSPITAL_COMMUNITY): Payer: BC Managed Care – PPO

## 2011-12-09 DIAGNOSIS — D649 Anemia, unspecified: Secondary | ICD-10-CM

## 2011-12-09 LAB — CBC WITH DIFFERENTIAL/PLATELET
Basophils Absolute: 0 10*3/uL (ref 0.0–0.1)
Eosinophils Relative: 2 % (ref 0–5)
HCT: 31.4 % — ABNORMAL LOW (ref 39.0–52.0)
Hemoglobin: 10.2 g/dL — ABNORMAL LOW (ref 13.0–17.0)
Lymphocytes Relative: 22 % (ref 12–46)
Lymphs Abs: 1.2 10*3/uL (ref 0.7–4.0)
MCV: 95.2 fL (ref 78.0–100.0)
Monocytes Absolute: 0.5 10*3/uL (ref 0.1–1.0)
Neutro Abs: 3.5 10*3/uL (ref 1.7–7.7)
RBC: 3.3 MIL/uL — ABNORMAL LOW (ref 4.22–5.81)
RDW: 19.6 % — ABNORMAL HIGH (ref 11.5–15.5)
WBC: 5.3 10*3/uL (ref 4.0–10.5)

## 2011-12-09 LAB — LACTATE DEHYDROGENASE: LDH: 427 U/L — ABNORMAL HIGH (ref 94–250)

## 2011-12-09 LAB — RETICULOCYTES
RBC.: 3.3 MIL/uL — ABNORMAL LOW (ref 4.22–5.81)
Retic Count, Absolute: 125.4 10*3/uL (ref 19.0–186.0)

## 2011-12-09 NOTE — Telephone Encounter (Signed)
RMR spoke with pt, see separate documentation.

## 2011-12-10 ENCOUNTER — Encounter (HOSPITAL_COMMUNITY): Payer: Self-pay | Admitting: Oncology

## 2011-12-10 ENCOUNTER — Encounter (HOSPITAL_BASED_OUTPATIENT_CLINIC_OR_DEPARTMENT_OTHER): Payer: BC Managed Care – PPO | Admitting: Oncology

## 2011-12-10 VITALS — BP 117/73 | HR 87 | Temp 98.2°F | Resp 16 | Ht 67.5 in | Wt 160.3 lb

## 2011-12-10 DIAGNOSIS — E538 Deficiency of other specified B group vitamins: Secondary | ICD-10-CM

## 2011-12-10 DIAGNOSIS — D61818 Other pancytopenia: Secondary | ICD-10-CM

## 2011-12-10 NOTE — Patient Instructions (Addendum)
[facility]  Discharge Instructions  RECOMMENDATIONS MADE BY THE CONSULTANT AND ANY TEST RESULTS WILL BE SENT TO YOUR REFERRING DOCTOR.   EXAM FINDINGS BY MD TODAY AND SIGNS AND SYMPTOMS TO REPORT TO CLINIC OR PRIMARY MD: discussion per MD and PA.  Wants you to take your B12 shot weekly for 2 more weeks then every 4 weeks.  MEDICATIONS PRESCRIBED: none   INSTRUCTIONS GIVEN AND DISCUSSED: Other :  Report increased fatigue, shortness of breath, etc.  SPECIAL INSTRUCTIONS/FOLLOW-UP: Lab work Needed every 2 weeks and Return to Clinic in 4 weeks for follow-up with PA.   I acknowledge that I have been informed and understand all the instructions given to me and received a copy. I do not have any more questions at this time, but understand that I may call the Specialty Clinic at [facility] at [phone] during business hours should I have any further questions or need assistance in obtaining follow-up care.    __________________________________________  _____________  __________ Signature of Patient or Authorized Representative            Date                   Time    __________________________________________ Nurse's Signature

## 2011-12-10 NOTE — Progress Notes (Signed)
[name], MD [address] [place] [address]  1. B12 deficiency  omeprazole (PRILOSEC) 20 MG capsule, CBC with Differential, CBC with Differential, Vitamin B12, Reticulocytes, Sedimentation rate    CURRENT THERAPY: On Vitamin B12 Injections.  INTERVAL HISTORY: BRANDUN PINN 61 y.o. male returns for  regular  visit for followup of Pancytopenia, secondary to B12 Deficiency   Brentt is here for follow-up after being seen in the hospital for pancytopenia by Dr. Si Gaul (Hem/Onc).  He was noted to be Vitamin B12 Deficient.  After replaced with SQ Vitamin B12 injection, his LDH has improved significantly, his WBC count is normal, his platelet count is within normal limits, Hemoglobin has improved, but is not yet normal.  RBCs are usually the last to improve.  Intrinsic factor antibody and parietal cell antibody is pending.  I provided the patient with education regarding Vitamin B12 deficiency.  He understands that it may be secondary to a gut absorption issue.  He is not cachetic and he has a great appetite so I do not think it is nutritional.  He will administer Vitamin B12 injections every week x 2 more weeks and then every 4 weeks life-long.    I answered all of his questions and he and his wife asked appropriate questions.  He reports that his energy level is improved.  He denies a sore tongue.    He does report some "night sweats" that are not drenching in nature.  His chest gets wet.  We will monitor.  Past Medical History  Diagnosis Date  . Peptic ulcer 1971  . CAD in native artery   . Pancytopenia 11/28/2011  . B12 deficiency 11/29/2011  . Anemia 11/28/2011  . Colon polyps 11/30/2011  . Leukopenia 11/30/2011  . Unintentional weight loss 11/28/2011    has Anemia; Unintentional weight loss; Heme positive stool; Pancytopenia; Tobacco use disorder; PUD (peptic ulcer disease); B12 deficiency; Colon polyps; and Leukopenia on his problem list.     has no known allergies.  Mr. Lundin had no medications administered during this visit.  Past Surgical History  Procedure Date  . Appendectomy 1970  . Cardiac catheterization 1996    s/p PTCA/stent (Dr Riley Kill)  . Stomach surgery 1971    Hx PUD     Denies any headaches, dizziness, double vision, fevers, chills, night sweats, nausea, vomiting, diarrhea, constipation, chest pain, heart palpitations, shortness of breath, blood in stool, black tarry stool, urinary pain, urinary burning, urinary frequency, hematuria.   PHYSICAL EXAMINATION  ECOG PERFORMANCE STATUS: 1 - Symptomatic but completely ambulatory  Filed Vitals:   12/10/11 1200  BP: 117/73  Pulse: 87  Temp: 98.2 F (36.8 C)  Resp: 16    GENERAL:alert, healthy, no distress, well nourished, well developed, comfortable, cooperative and smiling SKIN: skin color, texture, turgor are normal, no rashes or significant lesions HEAD: Normocephalic, No masses, lesions, tenderness or abnormalities EYES: normal, Conjunctiva are pink and non-injected EARS: External ears normal OROPHARYNX:lips, buccal mucosa, and tongue normal and mucous membranes are moist  NECK: supple, trachea midline LYMPH:  no palpable lymphadenopathy BREAST:not examined LUNGS: clear to auscultation  HEART: regular rate & rhythm, no murmurs, no gallops, S1 normal and S2 normal ABDOMEN:abdomen soft, non-tender and normal bowel sounds BACK: Back symmetric, no curvature. EXTREMITIES:less then 2 second capillary refill, no joint deformities, effusion, or inflammation, no edema, no skin discoloration, no clubbing, no cyanosis  NEURO: alert & oriented x 3 with fluent speech, no focal motor/sensory  deficits, gait normal   LABORATORY DATA: CBC    Component Value Date/Time   WBC 5.3 12/09/2011 1059   RBC 3.30* 12/09/2011 1059   HGB 10.2* 12/09/2011 1059   HCT 31.4* 12/09/2011 1059   HCT 15 11/19/2011 0827   PLT 321 12/09/2011 1059   MCV 95.2 12/09/2011 1059   MCH 30.9  12/09/2011 1059   MCHC 32.5 12/09/2011 1059   RDW 19.6* 12/09/2011 1059   LYMPHSABS 1.2 12/09/2011 1059   MONOABS 0.5 12/09/2011 1059   EOSABS 0.1 12/09/2011 1059   BASOSABS 0.0 12/09/2011 1059      ASSESSMENT:  1. Pancytopenia, secondary to B12 Deficiency  PLAN:  1. Vitamin B12 injections weekly x 2 weeks, then every 4 weeks. 2. Has 1 refill on Vitamin B12, will call clinic when he needs refill 3. Lab work every 2 weeks 4. Return in 4 weeks for follow-up  All questions were answered. The patient knows to call the clinic with any problems, questions or concerns. We can certainly see the patient much sooner if necessary.  The patient and plan discussed with Glenford Peers, MD and he is in agreement with the aforementioned.  Patient seen by Dr. Glenford Peers as well.   Rubens Cranston

## 2011-12-12 ENCOUNTER — Telehealth: Payer: Self-pay | Admitting: Internal Medicine

## 2011-12-12 LAB — INTRINSIC FACTOR ANTIBODIES: Intrinsic Factor: NEGATIVE

## 2011-12-12 NOTE — Telephone Encounter (Signed)
Discussed case with Drs [name] and Karilyn Cota; reviewed endo photos with Dr. Karilyn Cota.  Hematological parameters much better with B12 supp; last plts 312K.  After some consideration, I have called pt and recommended he have another colonoscopy with the goal of removing as many of the polyps as possible and then review path - if no cancer , will proceed with a subsequent tcs as the appropriate interval:  He's agreeable.

## 2011-12-13 ENCOUNTER — Telehealth: Payer: Self-pay

## 2011-12-13 ENCOUNTER — Ambulatory Visit: Admit: 2011-12-13 | Payer: Self-pay | Admitting: Gastroenterology

## 2011-12-13 ENCOUNTER — Other Ambulatory Visit: Payer: Self-pay | Admitting: Internal Medicine

## 2011-12-13 DIAGNOSIS — Z86010 Personal history of colonic polyps: Secondary | ICD-10-CM

## 2011-12-13 SURGERY — COLONOSCOPY WITH ESOPHAGOGASTRODUODENOSCOPY (EGD)
Anesthesia: Moderate Sedation

## 2011-12-13 NOTE — Telephone Encounter (Signed)
Patient is scheduled for Monday Oct 14th at 1:45 that's a No Call Day being the last case of the day with double time added and instructions were mailed to the patient Needs split Movie prep and full 2 days of clear liquids and 2 tap water enemas day of procedure. Ok to triage. And he is aware.

## 2011-12-13 NOTE — Telephone Encounter (Addendum)
    Gastroenterology Pre-Procedure Form   I called to triage pt per Dr. [name] request. Soledad Gerlach to give date and time)  Request Date: 9///27/2013     Requesting Physician: Dr. [name]     PATIENT INFORMATION:  Frank Lin is a 61 y.o., male (DOB=1950-07-12).  PROCEDURE: Procedure(s) requested: colonoscopy Procedure Reason: anemia/ blood in stool  PATIENT REVIEW QUESTIONS: The patient reports the following:   1. Diabetes Melitis: no 2. Joint replacements in the past 12 months: no 3. Major health problems in the past 3 months: no 4. Has an artificial valve or MVP:no 5. Has been advised in past to take antibiotics in advance of a procedure like teeth cleaning: no}    MEDICATIONS & ALLERGIES:    Patient reports the following regarding taking any blood thinners:   Plavix? no Aspirin?no Coumadin?  no  Patient confirms/reports the following medications:  Current Outpatient Prescriptions  Medication Sig Dispense Refill  . cyanocobalamin (,VITAMIN B-12,) 1000 MCG/ML injection Give one injection intramuscularly daily for the next 5 days; then one injection once weekly for 4 weeks; and then one injection monthly until further instructed by your primary or specialist physician.  20 mL  1  . omeprazole (PRILOSEC) 20 MG capsule Take 20 mg by mouth daily.        Patient confirms/reports the following allergies:  No Known Allergies  Patient is appropriate to schedule for requested procedure(s): yes  AUTHORIZATION INFORMATION Primary Insurance:   ID #:   Group #:  Pre-Cert / Auth required: Pre-Cert / Auth #:   Secondary Insurance:   ID #:   Group #:   Pre-Cert / Auth #:   No orders of the defined types were placed in this encounter.    SCHEDULE INFORMATION: Procedure has been scheduled as follows:  Date:               Time:   Location:   This Gastroenterology Pre-Precedure Form is being routed to the following provider(s) for review: R. Roetta Sessions, MD

## 2011-12-13 NOTE — Telephone Encounter (Signed)
Forwarding to Doris for triage 

## 2011-12-15 NOTE — Telephone Encounter (Signed)
ok 

## 2011-12-16 ENCOUNTER — Other Ambulatory Visit: Payer: Self-pay | Admitting: Internal Medicine

## 2011-12-16 NOTE — Telephone Encounter (Signed)
See separate triage. Dr. [name] has singed off on it.

## 2011-12-23 ENCOUNTER — Encounter (HOSPITAL_COMMUNITY): Payer: BC Managed Care – PPO | Attending: Family Medicine

## 2011-12-23 DIAGNOSIS — D61818 Other pancytopenia: Secondary | ICD-10-CM

## 2011-12-23 DIAGNOSIS — E538 Deficiency of other specified B group vitamins: Secondary | ICD-10-CM | POA: Insufficient documentation

## 2011-12-23 LAB — CBC WITH DIFFERENTIAL/PLATELET
Basophils Relative: 1 % (ref 0–1)
Eosinophils Absolute: 0.2 10*3/uL (ref 0.0–0.7)
HCT: 39.3 % (ref 39.0–52.0)
Hemoglobin: 12.7 g/dL — ABNORMAL LOW (ref 13.0–17.0)
MCH: 29.1 pg (ref 26.0–34.0)
MCHC: 32.3 g/dL (ref 30.0–36.0)
Monocytes Absolute: 0.4 10*3/uL (ref 0.1–1.0)
Monocytes Relative: 7 % (ref 3–12)

## 2011-12-23 NOTE — Progress Notes (Signed)
Labs drawn today for cbc/diff 

## 2011-12-24 ENCOUNTER — Encounter (HOSPITAL_COMMUNITY): Payer: Self-pay | Admitting: Pharmacy Technician

## 2011-12-24 ENCOUNTER — Encounter (HOSPITAL_COMMUNITY): Payer: BC Managed Care – PPO

## 2011-12-30 ENCOUNTER — Encounter (HOSPITAL_COMMUNITY): Payer: Self-pay | Admitting: *Deleted

## 2011-12-30 ENCOUNTER — Encounter (HOSPITAL_COMMUNITY)
Admission: RE | Disposition: A | Discharge status: Home / Self Care | Payer: Self-pay | Source: Ambulatory Visit | Attending: Internal Medicine

## 2011-12-30 ENCOUNTER — Ambulatory Visit (HOSPITAL_COMMUNITY)
Admission: RE | Admit: 2011-12-30 | Discharge: 2011-12-30 | Disposition: A | Discharge status: Home / Self Care | Payer: BC Managed Care – PPO | Source: Ambulatory Visit | Attending: Internal Medicine | Admitting: Internal Medicine

## 2011-12-30 DIAGNOSIS — D129 Benign neoplasm of anus and anal canal: Secondary | ICD-10-CM | POA: Insufficient documentation

## 2011-12-30 DIAGNOSIS — D128 Benign neoplasm of rectum: Secondary | ICD-10-CM | POA: Insufficient documentation

## 2011-12-30 DIAGNOSIS — Z09 Encounter for follow-up examination after completed treatment for conditions other than malignant neoplasm: Secondary | ICD-10-CM | POA: Insufficient documentation

## 2011-12-30 DIAGNOSIS — Z860100 Personal history of colon polyps, unspecified: Secondary | ICD-10-CM | POA: Insufficient documentation

## 2011-12-30 DIAGNOSIS — D126 Benign neoplasm of colon, unspecified: Secondary | ICD-10-CM | POA: Insufficient documentation

## 2011-12-30 DIAGNOSIS — K621 Rectal polyp: Secondary | ICD-10-CM

## 2011-12-30 DIAGNOSIS — K620 Anal polyp: Secondary | ICD-10-CM

## 2011-12-30 HISTORY — PX: COLONOSCOPY: SHX5424

## 2011-12-30 SURGERY — COLONOSCOPY
Anesthesia: Moderate Sedation

## 2011-12-30 MED ORDER — STERILE WATER FOR IRRIGATION IR SOLN
Status: DC | PRN
  Administered 2011-12-30: 13:00:00

## 2011-12-30 MED ORDER — MEPERIDINE HCL 100 MG/ML IJ SOLN
INTRAMUSCULAR | Status: AC
  Filled 2011-12-30: qty 1

## 2011-12-30 MED ORDER — MIDAZOLAM HCL 5 MG/5ML IJ SOLN
INTRAMUSCULAR | Status: AC
  Filled 2011-12-30: qty 10

## 2011-12-30 MED ORDER — SODIUM CHLORIDE 0.9 % IJ SOLN
INTRAMUSCULAR | Status: DC | PRN
  Administered 2011-12-30 (×2)

## 2011-12-30 MED ORDER — MIDAZOLAM HCL 5 MG/5ML IJ SOLN
INTRAMUSCULAR | Status: DC | PRN
  Administered 2011-12-30: 2 mg via INTRAVENOUS
  Administered 2011-12-30 (×4): 1 mg via INTRAVENOUS
  Administered 2011-12-30: 2 mg via INTRAVENOUS
  Administered 2011-12-30: 1 mg via INTRAVENOUS

## 2011-12-30 MED ORDER — SODIUM CHLORIDE 0.45 % IV SOLN
INTRAVENOUS | Status: DC
  Administered 2011-12-30: 1000 mL via INTRAVENOUS

## 2011-12-30 MED ORDER — MEPERIDINE HCL 100 MG/ML IJ SOLN
INTRAMUSCULAR | Status: DC | PRN
  Administered 2011-12-30 (×2): 50 mg via INTRAVENOUS

## 2011-12-30 MED ORDER — SODIUM CHLORIDE 0.45 % IV SOLN
INTRAVENOUS | Status: DC | PRN
  Administered 2011-12-30: 50 mL/h via INTRAVENOUS

## 2011-12-30 MED ORDER — EPINEPHRINE HCL 0.1 MG/ML IJ SOLN
INTRAMUSCULAR | Status: AC
  Filled 2011-12-30: qty 20

## 2011-12-30 NOTE — Op Note (Addendum)
[facility] [address] [place], [address]   COLONOSCOPY PROCEDURE REPORT  PATIENT: Frank Lin, Frank Lin  MR#:         [id number] BIRTHDATE: 1951/01/01 , 61  yrs. old GENDER: Male ENDOSCOPIST: R.  Roetta Sessions, MD FACP FACG REFERRED BY:  Karleen Hampshire, M.D. PROCEDURE DATE:  12/30/2011 PROCEDURE:     colonoscopy with multiple snare polypectomies and polyp ablation  INDICATIONS: history multiple polyps - not all removed at recent colonoscopy  INFORMED CONSENT:  The risks, benefits, alternatives and imponderables including but not limited to bleeding, perforation as well as the possibility of a missed lesion have been reviewed.  The potential for biopsy, lesion removal, etc. have also been discussed.  Questions have been answered.  All parties agreeable. Please see the history and physical in the medical record for more information.  MEDICATIONS: Versed 9 mg IV Demerol 100 mg IV in divided doses.  DESCRIPTION OF PROCEDURE:  After a digital rectal exam was performed, the Pentax Colonoscope [id number]  colonoscope was advanced from the anus through the rectum and colon to the area of the cecum, ileocecal valve and appendiceal orifice.  The cecum was deeply intubated.  These structures were well-seen and photographed for the record.  From the level of the cecum and ileocecal valve, the scope was slowly and cautiously withdrawn.  The mucosal surfaces were carefully surveyed utilizing scope tip deflection to facilitate fold flattening as needed.  The scope was pulled down into the rectum where a thorough examination including retroflexion was performed.    FINDINGS:  Good preparation. Single 4 mm polyp in the rectum 4 cm from anal verge; otherwise normal rectum. Normal appearing sigmoid segment. Patient multiple colonic polyps extending from the proximal descending colon, hepatic flexure on to the cecum with numerous large polyps in the segment from the hepatic  flexure all the way to the cecum.  Patient had multiple small polyps in the descending and ascending segments as well. THERAPEUTIC / DIAGNOSTIC MANEUVERS PERFORMED:   I performed approximately 25 hot snare polypectomies, removing polyps from the descending and ascending segments. There were diminutive polyps in the cecum which were ablated with the hot snare loop. A single rectal polyp was removed with hot snare cautery and recovered separately. There were multiple areas of large pedunculated polyps extending from the hepatic flexure to the proximal ascending segment. Multiple piecemeal snare polypectomies were performed. Couple of  these polyp were removed completely. At least 3 others were piecemeal debulked with some residual polyp tissue remaining. There were a couple areas of oozing at the polypectomy sites which were treated successfully with a total of 13 cc of 1-10,000 epinephrine injected submucosally.  It is notable that I submitted numerous specimens to the pathologist. Many large pieces of polyp tissue which would not suctioned through were retrieved utilizing the Roth net and  removed  multiple large fragments of polyps from the ascending segment, making at least 7 passes with scope out and then back in to the ascending segment during this procedure.  COMPLICATIONS: none  CECAL WITHDRAWAL TIME:  58 minutes  IMPRESSION:  Multiple colonic polyps - status post multiple snare polypectomies and polyp debulking.  1 small rectal polyp removed as described above. Sigmoid and rectum looked good endoscopically.  RECOMMENDATIONS: Clear liquid diet the remainder of today. Advance diet as tolerated tomorrow if doing well.  No aspirin or arthritis medications for 2 weeks. Further recommendations to follow pending review of pathology report.   _______________________________ eSigned:  Jonathon Bellows, MD FACP [facility] 01/01/2012 2:58 PM Revised: 01/01/2012 2:58  PM  CC:    PATIENT NAME:  Taariq, Leitz MR#: [id number]

## 2011-12-30 NOTE — H&P (Signed)
Primary Care Physician:  [name], MD Primary Gastroenterologist:  Dr. [name]  Pre-Procedure History & Physical: HPI:  Frank Lin is a 61 y.o. male here for followup colonoscopy. Recent colonoscopy for Hemoccult-positive stool demonstrated numerous large adenomatous polyps. Biopsied but not all removed. They came back adenomatous. We discussed surgical resection of part of his colon to  take care of this problem. However, with additional discussions, now that his hematological parameters have improved significantly, it will likely be worthwhile doing another elective colonoscopy and get out all the polyps that we possibly get out and then determine the pathology before making further recommendations.  Past Medical History  Diagnosis Date  . Peptic ulcer 1971  . CAD in native artery   . Pancytopenia 11/28/2011  . B12 deficiency 11/29/2011  . Anemia 11/28/2011  . Colon polyps 11/30/2011  . Leukopenia 11/30/2011  . Unintentional weight loss 11/28/2011    Past Surgical History  Procedure Date  . Appendectomy 1970  . Cardiac catheterization 1996    s/p PTCA/stent (Dr Riley Kill)  . Stomach surgery 1971    Hx PUD   . Septoplasty     Prior to Admission medications   Medication Sig Start Date End Date Taking? Authorizing Provider  cyanocobalamin (,VITAMIN B-12,) 1000 MCG/ML injection Give one injection intramuscularly daily for the next 5 days; then one injection once weekly for 4 weeks; and then one injection monthly until further instructed by your primary or specialist physician. 11/30/11  Yes Elliot Cousin, MD  omeprazole (PRILOSEC) 20 MG capsule Take 20 mg by mouth daily.   Yes Historical Provider, MD    Allergies as of 12/13/2011  . (No Known Allergies)    Family History  Problem Relation Age of Onset  . Heart failure Brother     History   Social History  . Marital Status: Married    Spouse Name: N/A    Number of Children: 2  . 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 Smoking status: Former Smoker -- 0.5 packs/day for 45 years    Types: Cigarettes    Quit date: 11/27/1996  . Smokeless tobacco: Never Used  . Alcohol Use: No  . Drug Use: No  . Sexually Active: Yes   Other Topics Concern  . Not on file   Social History Narrative  . No narrative on file    Review of Systems: See HPI, otherwise negative ROS  Physical Exam: BP 148/89  Pulse 96  Temp 98.4 F (36.9 C) (Oral)  Resp 20  Ht 5' 7.5" (1.715 m)  Wt 154 lb (69.854 kg)  BMI 23.76 kg/m2  SpO2 97% General:   Alert,  Well-developed, well-nourished, pleasant and cooperative in NAD Skin:  Intact without significant lesions or rashes. Eyes:  Sclera clear, no icterus.   Conjunctiva pink. Ears:  Normal auditory acuity. Nose:  No deformity, discharge,  or lesions. Mouth:  No deformity or lesions. Neck:  Supple; no masses or thyromegaly. No significant cervical adenopathy. Lungs:  Clear throughout to auscultation.   No wheezes, crackles, or rhonchi. No acute distress. Heart:  Regular rate and rhythm; no murmurs, clicks, rubs,  or gallops. Abdomen: Non-distended, normal bowel sounds.  Soft and nontender without appreciable mass or hepatosplenomegaly.  Pulses:  Normal pulses noted. Extremities:  Without clubbing or edema.  Impression/Plan:  Pleasant gentleman with profound pancytopenia secondary B12 deficiency recently also found to have numerous large  colonic adenomas. He comes for followup colonoscopy to try to remove all polyps that exist. He understands I may not be able to get all the poly;ps ou tin one settingh. Depending on the grade of pathology, he still may need a surgical resection. However, I told him at a minimum, he will likely need an early interval followup colonoscopy even after today's procedure. His questions have been answered. He is agreeable.The risks, benefits, limitations,  alternatives and imponderables have been reviewed with the patient. Questions have been answered. All parties are agreeable.

## 2012-01-02 NOTE — Progress Notes (Signed)
Patient is scheduled with Dr. Lovell Sheehan on Thursday October 24 at 11:00 am and Mr. Oshita is aware

## 2012-01-03 ENCOUNTER — Encounter (HOSPITAL_COMMUNITY): Payer: Self-pay | Admitting: Internal Medicine

## 2012-01-06 ENCOUNTER — Encounter (HOSPITAL_BASED_OUTPATIENT_CLINIC_OR_DEPARTMENT_OTHER): Payer: BC Managed Care – PPO

## 2012-01-06 DIAGNOSIS — D649 Anemia, unspecified: Secondary | ICD-10-CM

## 2012-01-06 DIAGNOSIS — E538 Deficiency of other specified B group vitamins: Secondary | ICD-10-CM

## 2012-01-06 LAB — CBC WITH DIFFERENTIAL/PLATELET
Basophils Relative: 0 % (ref 0–1)
Eosinophils Absolute: 0.3 10*3/uL (ref 0.0–0.7)
Eosinophils Relative: 4 % (ref 0–5)
MCH: 28.3 pg (ref 26.0–34.0)
MCHC: 33.1 g/dL (ref 30.0–36.0)
MCV: 85.5 fL (ref 78.0–100.0)
Neutrophils Relative %: 51 % (ref 43–77)
Platelets: 175 10*3/uL (ref 150–400)

## 2012-01-06 LAB — VITAMIN B12: Vitamin B-12: 334 pg/mL (ref 211–911)

## 2012-01-06 LAB — RETICULOCYTES: Retic Ct Pct: 0.8 % (ref 0.4–3.1)

## 2012-01-06 LAB — SEDIMENTATION RATE: Sed Rate: 5 mm/hr (ref 0–16)

## 2012-01-06 NOTE — Progress Notes (Signed)
Labs drawn today for cbc/diff,b12,retic,sed rate

## 2012-01-07 ENCOUNTER — Encounter (HOSPITAL_BASED_OUTPATIENT_CLINIC_OR_DEPARTMENT_OTHER): Payer: BC Managed Care – PPO | Admitting: Oncology

## 2012-01-07 VITALS — BP 150/84 | HR 82 | Temp 98.3°F | Resp 18

## 2012-01-07 DIAGNOSIS — E538 Deficiency of other specified B group vitamins: Secondary | ICD-10-CM

## 2012-01-07 DIAGNOSIS — R634 Abnormal weight loss: Secondary | ICD-10-CM

## 2012-01-07 DIAGNOSIS — D126 Benign neoplasm of colon, unspecified: Secondary | ICD-10-CM

## 2012-01-07 NOTE — Patient Instructions (Addendum)
[facility]  Discharge Instructions  RECOMMENDATIONS MADE BY THE CONSULTANT AND ANY TEST RESULTS WILL BE SENT TO YOUR REFERRING DOCTOR.   Lab work in 2 months and 4 months. Continue B12 injections every 4 weeks. Return to see doctor in 4 months. Report any issues/concerns to clinic prior to appointment as needed.   I acknowledge that I have been informed and understand all the instructions given to me and received a copy. I do not have any more questions at this time, but understand that I may call the Specialty Clinic at [facility] at [phone] during business hours should I have any further questions or need assistance in obtaining follow-up care.    __________________________________________  _____________  __________ Signature of Patient or Authorized Representative            Date                   Time    __________________________________________ Nurse's Signature

## 2012-01-07 NOTE — Progress Notes (Signed)
[name], MD [address] [place] [address]  1. B12 deficiency  cyanocobalamin (,VITAMIN B-12,) 1000 MCG/ML injection, CBC with Differential, Vitamin B12, Lactate dehydrogenase, CBC with Differential, Vitamin B12, Reticulocytes, Lactate dehydrogenase, Sedimentation rate, Parietal Cell Antibody, Total    CURRENT THERAPY:On Vitamin B12 Injections.  INTERVAL HISTORY: THEDFORD BUNTON 61 y.o. male returns for  regular  visit for followup of Vitamin B 11 Deficiency   Mr. Deakin was recently seen by Dr. [name] (GI) for multiple colonic polyps and colonoscopy. Colonoscopy was performed and Dr. [name] report that the patient has multifocal high-grade dysplasia in multiple descending and ascending polyps. In addition, not all of the ascending polyps were completely removed. Given the multiplicity of polyps and the high grade pathology present, I recommended that the patient undergo a subtotal colectomy.  Pathology follows below.   Hematologically, his pancytopenia has resolved.  I personally reviewed and went over laboratory results with the patient.  Everything is WNL.  B12 is adequate.  Intrinsic antibody is negative.  Parietal cell antibody failed to be completed, so we will perform that lab work in the future.   So, Mr. Delmundo is doing well.  He reports that his energy level is significantly improved.  He denies any SOB or fatigue.    Starting today, he will take Vit B12 injections every 4 weeks.  He has approximately 8 injections at home.   Complete ROS questioning is negative.   Past Medical History  Diagnosis Date  . Peptic ulcer 1971  . CAD in native artery   . Pancytopenia 11/28/2011  . B12 deficiency 11/29/2011  . Anemia 11/28/2011  . Colon polyps 11/30/2011  . Leukopenia 11/30/2011  . Unintentional weight loss 11/28/2011    has Anemia; Unintentional weight loss; Heme positive stool; Pancytopenia; Tobacco use disorder; PUD (peptic ulcer disease); B12  deficiency; Colon polyps; and Leukopenia on his problem list.      has no known allergies.  Mr. Marchitto had no medications administered during this visit.  Past Surgical History  Procedure Date  . Appendectomy 1970  . Cardiac catheterization 1996    s/p PTCA/stent (Dr Riley Kill)  . Stomach surgery 1971    Hx PUD   . Septoplasty   . Colonoscopy 12/30/2011    Procedure: COLONOSCOPY;  Surgeon: Corbin Ade, MD;  Location: [facility];  Service: Endoscopy;  Laterality: N/A;  1:45/Need to schedule double time; will be a long case    Denies any headaches, dizziness, double vision, fevers, chills, night sweats, nausea, vomiting, diarrhea, constipation, chest pain, heart palpitations, shortness of breath, blood in stool, black tarry stool, urinary pain, urinary burning, urinary frequency, hematuria.   PHYSICAL EXAMINATION  ECOG PERFORMANCE STATUS: 0 - Asymptomatic  Filed Vitals:   01/07/12 1000  BP: 150/84  Pulse: 82  Temp: 98.3 F (36.8 C)  Resp: 18    GENERAL:alert, healthy, no distress, well nourished, well developed, comfortable, cooperative and smiling SKIN: skin color, texture, turgor are normal, no rashes or significant lesions HEAD: Normocephalic, No masses, lesions, tenderness or abnormalities EYES: normal, Conjunctiva are pink and non-injected EARS: External ears normal OROPHARYNX:lips, buccal mucosa, and tongue normal and mucous membranes are moist  NECK: supple, trachea midline LYMPH:  not examined BREAST:not examined LUNGS: clear to auscultation and percussion HEART: regular rate & rhythm, no murmurs, no gallops, S1 normal and S2 normal ABDOMEN:abdomen soft, non-tender and normal bowel sounds BACK: Back symmetric, no curvature. EXTREMITIES:less then 2 second capillary refill,  no skin discoloration, no cyanosis  NEURO: alert & oriented x 3 with fluent speech, no focal motor/sensory deficits, gait normal   LABORATORY DATA: CBC    Component Value Date/Time    WBC 6.7 01/06/2012 0844   RBC 4.84 01/06/2012 0844   HGB 13.7 01/06/2012 0844   HCT 41.4 01/06/2012 0844   HCT 15 11/19/2011 0827   PLT 175 01/06/2012 0844   MCV 85.5 01/06/2012 0844   MCH 28.3 01/06/2012 0844   MCHC 33.1 01/06/2012 0844   RDW 16.0* 01/06/2012 0844   LYMPHSABS 2.7 01/06/2012 0844   MONOABS 0.3 01/06/2012 0844   EOSABS 0.3 01/06/2012 0844   BASOSABS 0.0 01/06/2012 0844    .lastchemo Lab Results  Component Value Date   VITAMINB12 334 01/06/2012    PATHOLOGY: 12/30/2011  Diagnosis 1. Colon, polyp(s), ascending/hepatic flexure - FRAGMENTS OF TUBULAR ADENOMA AND TUBULOVILLOUS ADENOMA WITH MULTIPLE AREAS OF FOCAL HIGH GRADE DYSPLASIA. - SEE COMMENT. 2. Colon, polyp(s), descending - FRAGMENTS OF TUBULAR ADENOMA WITH MULTIPLE AREAS OF FOCAL HIGH GRADE DYSPLASIA. - SEE COMMENT. 3. Rectum, polyp(s) - HYPERPLASTIC POLYP (X1). - NO DYSPLASIA OR MALIGNANCY IDENTIFIED. Microscopic Comment 1. Due to the fragmented nature of the ascending/hepatic flexure polyp specimen, assessment of the margin, precise quantification of polyp number and clarification of which polyp is involved with high grade dysplasia is not possible. Dr. Frederica Kuster has seen the descending polyp in consultation with agreement of the above diagnosis and comment. 2. Due to the fragmented nature of the descending polyp specimen, assessment of the margin, precise quantification of polyp number and clarification of which polyp is involved with high grade dysplasia is not possible. Dr. Frederica Kuster has seen the descending polyp in consultation with agreement of the above diagnosis and comment. Zandra Abts MD Pathologist, Electronic Signature (Case signed 01/01/2012)    ASSESSMENT:  1. Pancytopenia, secondary to B12 Deficiency, resolved 2. Numerous colonic polyps, followed by GI, recommended to have subtotal colectomy.    PLAN:  1. I personally reviewed and went over laboratory results with the patient. 2.  Recommend continued follow-up with GI and General surgeon as directed.  3. Continue B12 injection every 4 weeks 4. Lab work in 2 months: CBC diff, parietal cell antibody, Vit B12 level, LDH 5. Lab work in 4 months: CBC diff, LDH, Vit B12 level, Retic, ESR   All questions were answered. The patient knows to call the clinic with any problems, questions or concerns. We can certainly see the patient much sooner if necessary.  The patient and plan discussed with Glenford Peers, MD and he is in agreement with the aforementioned.  Michalla Ringer

## 2012-01-09 ENCOUNTER — Encounter (HOSPITAL_COMMUNITY): Payer: Self-pay

## 2012-01-09 NOTE — H&P (Signed)
  NTS SOAP Note  Vital Signs:  Vitals as of: 01/09/2012: Systolic 146: Diastolic 86: Heart Rate 85: Temp 98.23F: Height 62ft 7.5in: Weight 161Lbs 0 Ounces: BMI 25  BMI : 24.84 kg/m2  Subjective: This 61 Years 56 Months old Male presents for multiple dysplastic colon polyps found on recent TCS by Dr. [name].  Not all could be removed.  Sigmoid colon and rectum within normal limits.  Referred for subtotal colectomy.   Review of Symptoms:  Constitutional:unremarkable   Head:unremarkable    Eyes:unremarkable   ear infection Cardiovascular:  unremarkable   Respiratory:unremarkable   Gastrointestinal:  unremarkable   Genitourinary:unremarkable     Musculoskeletal:unremarkable   Skin:unremarkable     anemia requiring blood transfusions Allergic/Immunologic:unremarkable     Past Medical History:    Reviewed   Past Medical History  Surgical History: gastric surgeyr for PUD, appendectomy Medical Problems: pernicious anemia Transfusions: yes Allergies: nkda Medications: b12 injections   Social History:Reviewed  Social History  Preferred Language: English Ethnicity: Not Hispanic / Latino Age: 61 Years 8 Months Alcohol:  No Recreational drug(s):  No   Smoking Status: Never smoker reviewed on 01/09/2012 Functional Status reviewed on mm/dd/yyyy ------------------------------------------------ Bathing: Normal Cooking: Normal Dressing: Normal Driving: Normal Eating: Normal Managing Meds: Normal Oral Care: Normal Shopping: Normal Toileting: Normal Transferring: Normal Walking: Normal Cognitive Status reviewed on mm/dd/yyyy ------------------------------------------------ Attention: Normal Decision Making: Normal Language: Normal Memory: Normal Motor: Normal Perception: Normal Problem Solving: Normal Visual and Spatial: Normal   Family History:  Reviewed   Family History              Father:  Cancer, Coronary Artery  Disease             Mother:  Cancer    Objective Information: General:  Well appearing, well nourished in no distress. Throat:  no erythema, exudates or lesions. Neck:  Supple without lymphadenopathy.  Heart:  RRR, no murmur or gallop.  Normal S1, S2.  No S3, S4.  Lungs:    CTA bilaterally, no wheezes, rhonchi, rales.  Breathing unlabored. Abdomen:Soft, NT/ND, no HSM, no masses.  Assessment:Colon neoplasm, dysplastic polyps  Diagnosis &amp; Procedure: DiagnosisCode: 153.9, ProcedureCode: 16109,    Plan: Scheduled for laparoscopic handassisted partial colectomy on 01/15/12.  Risks and benefits including bleeding, infection, and the possibility of a blood transfusion were fully explained to the patient.  Patient Education:Alternative treatments to surgery were discussed with patient (and family).  Risks and benefits  of procedure were fully explained to the patient (and family) who gave informed consent. Patient/family questions were addressed.  Follow-up:Pending Surgery

## 2012-01-09 NOTE — Patient Instructions (Addendum)
20 DEYONTE CADDEN  01/09/2012   Your procedure is scheduled on:  01/15/2012  [facility] to [facility] at  10:10  AM.  Call this number if you have problems the morning of surgery: [phone]   Remember:   Do not eat food:After Midnight.  May have clear liquids:until Midnight .    Take these medicines the morning of surgery with A SIP OF WATER: prilosec   Do not wear jewelry, make-up or nail polish.  Do not wear lotions, powders, or perfumes. You may wear deodorant.  Do not shave 48 hours prior to surgery. Men may shave face and neck.  Do not bring valuables to the hospital.  Contacts, dentures or bridgework may not be worn into surgery.  Leave suitcase in the car. After surgery it may be brought to your room.  For patients admitted to the hospital, checkout time is 11:00 AM the day of discharge.   Patients discharged the day of surgery will not be allowed to drive home.  Name and phone number of your driver: family  Special Instructions: Shower using CHG 2 nights before surgery and the night before surgery.  If you shower the day of surgery use CHG.  Use special wash - you have one bottle of CHG for all showers.  You should use approximately 1/3 of the bottle for each shower.   Please read over the following fact sheets that you were given: Pain Booklet, Coughing and Deep Breathing, MRSA Information, Surgical Site Infection Prevention, Anesthesia Post-op Instructions and Care and Recovery After Surgery Laparoscopic Bowel Resection Laparoscopic bowel resection is used to remove a piece of large or small intestine that may be red, sore, or swollen (inflamed) or to remove a portion of bowel that is blocked. LET YOUR CAREGIVER KNOW ABOUT:   Allergies to food or medicine.  Medicines taken, including vitamins, herbs, eyedrops, over-the-counter medicines, and creams.  Use of steroids (by mouth or creams).  Previous problems with anesthetics or numbing medicines.  History of bleeding  problems or blood clots.  Previous surgery.  Other health problems, including diabetes and kidney problems.  Possibility of pregnancy, if this applies. RISKS AND COMPLICATIONS   Infection.  Bleeding.  Injury to other organs.  Anesthetic side effects.  Leakage from where the bowel is put back together (anastomosis).  Long delay before return of bowel function (ileus). BEFORE THE PROCEDURE   You should be present 60 minutes before your procedure or as directed by your caregiver. PROCEDURE  Laparoscopic means that the procedure is done with a thin, lighted tube (laparoscope). Once you are given medicine that makes you sleep (general anesthetic), your surgeon inflates your abdomen with carbon dioxide gas. The laparoscope is put into your abdomen through a small cut (incision). This allows your surgeon to see into the abdomen. A video camera is attached to the laparoscope to enlarge the view. The surgeon sees this image on a monitor. During the procedure, the portion of bowel to be removed is taken out through one of the incisions. The incision may need to be enlarged if the bowel is too large to be removed through one of the smaller incisions. In this case, a small incision will be made and sometimes the bowel repair is made outside the abdomen. AFTER THE PROCEDURE  If there are no problems, recovery time is brief compared to regular surgery. You will rest in a recovery room until you are stable and doing well. Following this, if you have  no other problems, you will be allowed to return to your room. Recovery time varies depending on what is found during surgery, your age, and your general health. Sometimes, it takes a few days for bowel function to return (passing gas, bowel movements). You will stay in the hospital until bowel function returns. Sometimes, a tube is placed in your stomach, through your nose (nasogastric tube). This tube is used to release pressure from your stomach  (decompress) and to decrease nausea until bowel function returns. Document Released: 08/28/2000 Document Revised: 09/03/2011 Document Reviewed: 07/24/2009 [facility] Patient Information 2013 [place], [place]. PATIENT INSTRUCTIONS POST-ANESTHESIA  IMMEDIATELY FOLLOWING SURGERY:  Do not drive or operate machinery for the first twenty four hours after surgery.  Do not make any important decisions for twenty four hours after surgery or while taking narcotic pain medications or sedatives.  If you develop intractable nausea and vomiting or a severe headache please notify your doctor immediately.  FOLLOW-UP:  Please make an appointment with your surgeon as instructed. You do not need to follow up with anesthesia unless specifically instructed to do so.  WOUND CARE INSTRUCTIONS (if applicable):  Keep a dry clean dressing on the anesthesia/puncture wound site if there is drainage.  Once the wound has quit draining you may leave it open to air.  Generally you should leave the bandage intact for twenty four hours unless there is drainage.  If the epidural site drains for more than 36-48 hours please call the anesthesia department.  QUESTIONS?:  Please feel free to call your physician or the hospital operator if you have any questions, and they will be happy to assist you.

## 2012-01-10 ENCOUNTER — Encounter (HOSPITAL_COMMUNITY)
Admission: RE | Admit: 2012-01-10 | Discharge: 2012-01-10 | Disposition: A | Discharge status: Home / Self Care | Payer: BC Managed Care – PPO | Source: Ambulatory Visit | Attending: General Surgery | Admitting: General Surgery

## 2012-01-10 ENCOUNTER — Encounter (HOSPITAL_COMMUNITY): Payer: Self-pay

## 2012-01-10 HISTORY — DX: Gastro-esophageal reflux disease without esophagitis: K21.9

## 2012-01-10 LAB — CBC WITH DIFFERENTIAL/PLATELET
Basophils Absolute: 0 10*3/uL (ref 0.0–0.1)
Basophils Relative: 1 % (ref 0–1)
Eosinophils Absolute: 0.2 10*3/uL (ref 0.0–0.7)
Eosinophils Relative: 3 % (ref 0–5)
HCT: 38.2 % — ABNORMAL LOW (ref 39.0–52.0)
MCH: 28.2 pg (ref 26.0–34.0)
MCHC: 33.2 g/dL (ref 30.0–36.0)
MCV: 84.7 fL (ref 78.0–100.0)
Monocytes Absolute: 0.4 10*3/uL (ref 0.1–1.0)
RDW: 16 % — ABNORMAL HIGH (ref 11.5–15.5)

## 2012-01-10 LAB — BASIC METABOLIC PANEL
BUN: 8 mg/dL (ref 6–23)
CO2: 26 mEq/L (ref 19–32)
Chloride: 104 mEq/L (ref 96–112)
Creatinine, Ser: 0.75 mg/dL (ref 0.50–1.35)
GFR calc Af Amer: >90 mL/min (ref >90)

## 2012-01-10 LAB — SURGICAL PCR SCREEN: MRSA, PCR: NEGATIVE

## 2012-01-13 ENCOUNTER — Encounter (HOSPITAL_COMMUNITY): Payer: BC Managed Care – PPO

## 2012-01-15 ENCOUNTER — Encounter (HOSPITAL_COMMUNITY): Payer: Self-pay | Admitting: Anesthesiology

## 2012-01-15 ENCOUNTER — Inpatient Hospital Stay (HOSPITAL_COMMUNITY)
Admission: RE | Admit: 2012-01-15 | Discharge: 2012-01-18 | DRG: 148 | Disposition: A | Discharge status: Home / Self Care | Payer: BC Managed Care – PPO | Source: Ambulatory Visit | Attending: General Surgery | Admitting: General Surgery

## 2012-01-15 ENCOUNTER — Ambulatory Visit (HOSPITAL_COMMUNITY): Payer: BC Managed Care – PPO | Admitting: Anesthesiology

## 2012-01-15 ENCOUNTER — Encounter (HOSPITAL_COMMUNITY)
Admission: RE | Disposition: A | Discharge status: Home / Self Care | Payer: Self-pay | Source: Ambulatory Visit | Attending: General Surgery

## 2012-01-15 ENCOUNTER — Encounter (HOSPITAL_COMMUNITY): Payer: Self-pay | Admitting: *Deleted

## 2012-01-15 DIAGNOSIS — D510 Vitamin B12 deficiency anemia due to intrinsic factor deficiency: Secondary | ICD-10-CM | POA: Diagnosis present

## 2012-01-15 DIAGNOSIS — K219 Gastro-esophageal reflux disease without esophagitis: Secondary | ICD-10-CM | POA: Diagnosis present

## 2012-01-15 DIAGNOSIS — Z9861 Coronary angioplasty status: Secondary | ICD-10-CM

## 2012-01-15 DIAGNOSIS — Z87891 Personal history of nicotine dependence: Secondary | ICD-10-CM

## 2012-01-15 DIAGNOSIS — D126 Benign neoplasm of colon, unspecified: Principal | ICD-10-CM | POA: Diagnosis present

## 2012-01-15 DIAGNOSIS — Z9089 Acquired absence of other organs: Secondary | ICD-10-CM

## 2012-01-15 DIAGNOSIS — I2510 Atherosclerotic heart disease of native coronary artery without angina pectoris: Secondary | ICD-10-CM | POA: Diagnosis present

## 2012-01-15 DIAGNOSIS — Z8711 Personal history of peptic ulcer disease: Secondary | ICD-10-CM

## 2012-01-15 HISTORY — PX: COLON RESECTION: SHX5231

## 2012-01-15 HISTORY — PX: PARTIAL COLECTOMY: SHX5273

## 2012-01-15 LAB — PREPARE RBC (CROSSMATCH)

## 2012-01-15 LAB — HEMOGLOBIN AND HEMATOCRIT, BLOOD: Hemoglobin: 12.7 g/dL — ABNORMAL LOW (ref 13.0–17.0)

## 2012-01-15 SURGERY — COLECTOMY, HAND-ASSISTED, LAPAROSCOPIC
Anesthesia: General | Site: Abdomen | Wound class: Clean Contaminated

## 2012-01-15 MED ORDER — HEMOSTATIC AGENTS (NO CHARGE) OPTIME
TOPICAL | Status: DC | PRN
  Administered 2012-01-15: 1 via TOPICAL

## 2012-01-15 MED ORDER — SODIUM CHLORIDE 0.9 % IR SOLN
Status: DC | PRN
  Administered 2012-01-15: 3000 mL

## 2012-01-15 MED ORDER — CHLORHEXIDINE GLUCONATE 4 % EX LIQD: 1.0000 "application " | Freq: Once | CUTANEOUS | Status: DC

## 2012-01-15 MED ORDER — ONDANSETRON HCL 4 MG/2ML IJ SOLN
4.0000 mg | Freq: Four times a day (QID) | INTRAMUSCULAR | Status: DC | PRN
  Administered 2012-01-16: 4 mg via INTRAVENOUS
  Filled 2012-01-15: qty 2

## 2012-01-15 MED ORDER — FENTANYL CITRATE 0.05 MG/ML IJ SOLN
INTRAMUSCULAR | Status: AC
  Filled 2012-01-15: qty 2

## 2012-01-15 MED ORDER — MIDAZOLAM HCL 2 MG/2ML IJ SOLN
INTRAMUSCULAR | Status: AC
  Filled 2012-01-15: qty 2

## 2012-01-15 MED ORDER — ONDANSETRON HCL 4 MG PO TABS: 4.0000 mg | ORAL_TABLET | Freq: Four times a day (QID) | ORAL | Status: DC | PRN

## 2012-01-15 MED ORDER — ONDANSETRON HCL 4 MG/2ML IJ SOLN
4.0000 mg | Freq: Once | INTRAMUSCULAR | Status: AC
  Administered 2012-01-15: 4 mg via INTRAVENOUS

## 2012-01-15 MED ORDER — POVIDONE-IODINE 10 % OINT PACKET
TOPICAL_OINTMENT | CUTANEOUS | Status: DC | PRN
  Administered 2012-01-15: 3 via TOPICAL

## 2012-01-15 MED ORDER — ROCURONIUM BROMIDE 100 MG/10ML IV SOLN
INTRAVENOUS | Status: DC | PRN
  Administered 2012-01-15: 10 mg via INTRAVENOUS
  Administered 2012-01-15: 50 mg via INTRAVENOUS

## 2012-01-15 MED ORDER — ONDANSETRON HCL 4 MG/2ML IJ SOLN
INTRAMUSCULAR | Status: DC | PRN
  Administered 2012-01-15: 4 mg via INTRAVENOUS

## 2012-01-15 MED ORDER — GLYCOPYRROLATE 0.2 MG/ML IJ SOLN
INTRAMUSCULAR | Status: DC | PRN
  Administered 2012-01-15: 0.2 mg via INTRAVENOUS
  Administered 2012-01-15: .4 mg via INTRAVENOUS

## 2012-01-15 MED ORDER — FENTANYL CITRATE 0.05 MG/ML IJ SOLN
INTRAMUSCULAR | Status: DC | PRN
  Administered 2012-01-15 (×2): 50 ug via INTRAVENOUS
  Administered 2012-01-15: 150 ug via INTRAVENOUS
  Administered 2012-01-15 (×4): 50 ug via INTRAVENOUS

## 2012-01-15 MED ORDER — SODIUM CHLORIDE 0.9 % IV SOLN
1.0000 g | INTRAVENOUS | Status: AC
  Administered 2012-01-15: 1 g via INTRAVENOUS

## 2012-01-15 MED ORDER — NEOSTIGMINE METHYLSULFATE 1 MG/ML IJ SOLN
INTRAMUSCULAR | Status: AC
  Filled 2012-01-15: qty 10

## 2012-01-15 MED ORDER — NEOSTIGMINE METHYLSULFATE 1 MG/ML IJ SOLN
INTRAMUSCULAR | Status: DC | PRN
  Administered 2012-01-15: 3 mg via INTRAVENOUS

## 2012-01-15 MED ORDER — SODIUM CHLORIDE 0.9 % IV SOLN
INTRAVENOUS | Status: AC
  Filled 2012-01-15: qty 1

## 2012-01-15 MED ORDER — MIDAZOLAM HCL 2 MG/2ML IJ SOLN
1.0000 mg | INTRAMUSCULAR | Status: DC | PRN
  Administered 2012-01-15 (×2): 2 mg via INTRAVENOUS

## 2012-01-15 MED ORDER — BUPIVACAINE HCL (PF) 0.5 % IJ SOLN
INTRAMUSCULAR | Status: DC | PRN
  Administered 2012-01-15: 15 mL

## 2012-01-15 MED ORDER — PROPOFOL 10 MG/ML IV BOLUS
INTRAVENOUS | Status: DC | PRN
  Administered 2012-01-15: 150 mg via INTRAVENOUS

## 2012-01-15 MED ORDER — GLYCOPYRROLATE 0.2 MG/ML IJ SOLN
0.2000 mg | Freq: Once | INTRAMUSCULAR | Status: AC
  Administered 2012-01-15: 0.2 mg via INTRAVENOUS

## 2012-01-15 MED ORDER — HYDROMORPHONE HCL PF 1 MG/ML IJ SOLN
1.0000 mg | INTRAMUSCULAR | Status: DC | PRN
  Administered 2012-01-16 – 2012-01-18 (×7): 1 mg via INTRAVENOUS
  Filled 2012-01-15 (×7): qty 1

## 2012-01-15 MED ORDER — BUPIVACAINE HCL (PF) 0.5 % IJ SOLN
INTRAMUSCULAR | Status: AC
  Filled 2012-01-15: qty 30

## 2012-01-15 MED ORDER — ALVIMOPAN 12 MG PO CAPS
12.0000 mg | ORAL_CAPSULE | Freq: Once | ORAL | Status: AC
  Administered 2012-01-15: 12 mg via ORAL

## 2012-01-15 MED ORDER — EPHEDRINE SULFATE 50 MG/ML IJ SOLN
INTRAMUSCULAR | Status: DC | PRN
  Administered 2012-01-15: 7.5 mg via INTRAVENOUS
  Administered 2012-01-15: 5 mg via INTRAVENOUS
  Administered 2012-01-15: 2.5 mg via INTRAVENOUS

## 2012-01-15 MED ORDER — ONDANSETRON HCL 4 MG/2ML IJ SOLN
INTRAMUSCULAR | Status: AC
  Filled 2012-01-15: qty 2

## 2012-01-15 MED ORDER — LACTATED RINGERS IV SOLN
INTRAVENOUS | Status: DC | PRN
  Administered 2012-01-15 (×3): via INTRAVENOUS

## 2012-01-15 MED ORDER — ONDANSETRON HCL 4 MG/2ML IJ SOLN: 4.0000 mg | Freq: Once | INTRAMUSCULAR | Status: DC | PRN

## 2012-01-15 MED ORDER — ENOXAPARIN SODIUM 40 MG/0.4ML ~~LOC~~ SOLN
40.0000 mg | SUBCUTANEOUS | Status: DC
  Administered 2012-01-16 – 2012-01-17 (×2): 40 mg via SUBCUTANEOUS
  Filled 2012-01-15 (×2): qty 0.4

## 2012-01-15 MED ORDER — LACTATED RINGERS IV SOLN
INTRAVENOUS | Status: DC
  Administered 2012-01-15 – 2012-01-16 (×2): via INTRAVENOUS

## 2012-01-15 MED ORDER — POVIDONE-IODINE 10 % EX OINT
TOPICAL_OINTMENT | CUTANEOUS | Status: AC
  Filled 2012-01-15: qty 2

## 2012-01-15 MED ORDER — PROPOFOL 10 MG/ML IV EMUL
INTRAVENOUS | Status: AC
  Filled 2012-01-15: qty 20

## 2012-01-15 MED ORDER — ACETAMINOPHEN 10 MG/ML IV SOLN
INTRAVENOUS | Status: AC
  Filled 2012-01-15: qty 100

## 2012-01-15 MED ORDER — ENOXAPARIN SODIUM 40 MG/0.4ML ~~LOC~~ SOLN
SUBCUTANEOUS | Status: AC
  Filled 2012-01-15: qty 0.4

## 2012-01-15 MED ORDER — ROCURONIUM BROMIDE 50 MG/5ML IV SOLN
INTRAVENOUS | Status: AC
  Filled 2012-01-15: qty 1

## 2012-01-15 MED ORDER — LACTATED RINGERS IV SOLN
INTRAVENOUS | Status: DC
  Administered 2012-01-15: 1000 mL via INTRAVENOUS
  Administered 2012-01-15: 10:00:00 via INTRAVENOUS

## 2012-01-15 MED ORDER — SODIUM CHLORIDE 0.9 % IR SOLN
Status: DC | PRN
  Administered 2012-01-15: 2000 mL

## 2012-01-15 MED ORDER — ACETAMINOPHEN 10 MG/ML IV SOLN
1000.0000 mg | Freq: Four times a day (QID) | INTRAVENOUS | Status: AC
  Administered 2012-01-15 – 2012-01-16 (×4): 1000 mg via INTRAVENOUS
  Filled 2012-01-15 (×3): qty 100

## 2012-01-15 MED ORDER — FENTANYL CITRATE 0.05 MG/ML IJ SOLN
INTRAMUSCULAR | Status: AC
  Filled 2012-01-15: qty 5

## 2012-01-15 MED ORDER — GLYCOPYRROLATE 0.2 MG/ML IJ SOLN
INTRAMUSCULAR | Status: AC
  Filled 2012-01-15: qty 1

## 2012-01-15 MED ORDER — ALVIMOPAN 12 MG PO CAPS
ORAL_CAPSULE | ORAL | Status: AC
  Filled 2012-01-15: qty 1

## 2012-01-15 MED ORDER — ALVIMOPAN 12 MG PO CAPS
12.0000 mg | ORAL_CAPSULE | Freq: Two times a day (BID) | ORAL | Status: DC
  Administered 2012-01-16 – 2012-01-18 (×5): 12 mg via ORAL
  Filled 2012-01-15 (×5): qty 1

## 2012-01-15 MED ORDER — ENOXAPARIN SODIUM 40 MG/0.4ML ~~LOC~~ SOLN
40.0000 mg | Freq: Once | SUBCUTANEOUS | Status: AC
  Administered 2012-01-15: 40 mg via SUBCUTANEOUS

## 2012-01-15 MED ORDER — GLYCOPYRROLATE 0.2 MG/ML IJ SOLN
INTRAMUSCULAR | Status: AC
  Filled 2012-01-15: qty 3

## 2012-01-15 MED ORDER — FENTANYL CITRATE 0.05 MG/ML IJ SOLN
25.0000 ug | INTRAMUSCULAR | Status: DC | PRN
  Administered 2012-01-15 (×4): 50 ug via INTRAVENOUS

## 2012-01-15 SURGICAL SUPPLY — 74 items
BAG HAMPER (MISCELLANEOUS) ×2 IMPLANT
BLADE SURG SZ10 CARB STEEL (BLADE) ×2 IMPLANT
CLOTH BEACON ORANGE TIMEOUT ST (SAFETY) ×2 IMPLANT
COVER LIGHT HANDLE STERIS (MISCELLANEOUS) ×4 IMPLANT
CUTTER ENDO LINEAR 45M (STAPLE) IMPLANT
DECANTER SPIKE VIAL GLASS SM (MISCELLANEOUS) ×2 IMPLANT
DRAPE INCISE IOBAN 44X35 STRL (DRAPES) ×2 IMPLANT
DRAPE WARM FLUID 44X44 (DRAPE) ×2 IMPLANT
DURAPREP 26ML APPLICATOR (WOUND CARE) ×2 IMPLANT
ELECT BLADE 6 FLAT ULTRCLN (ELECTRODE) IMPLANT
ELECT REM PT RETURN 9FT ADLT (ELECTROSURGICAL) ×2
ELECTRODE REM PT RTRN 9FT ADLT (ELECTROSURGICAL) ×1 IMPLANT
FILTER SMOKE EVAC LAPAROSHD (FILTER) ×2 IMPLANT
GLOVE BIO SURGEON STRL SZ7.5 (GLOVE) ×4 IMPLANT
GLOVE BIO SURGEON STRL SZ8 (GLOVE) ×2 IMPLANT
GLOVE BIOGEL PI IND STRL 7.0 (GLOVE) ×4 IMPLANT
GLOVE BIOGEL PI IND STRL 7.5 (GLOVE) ×1 IMPLANT
GLOVE BIOGEL PI INDICATOR 7.0 (GLOVE) ×4
GLOVE BIOGEL PI INDICATOR 7.5 (GLOVE) ×1
GLOVE ECLIPSE 7.0 STRL STRAW (GLOVE) ×2 IMPLANT
GLOVE EXAM NITRILE MD LF STRL (GLOVE) ×2 IMPLANT
GLOVE SS BIOGEL STRL SZ 6.5 (GLOVE) ×5 IMPLANT
GLOVE SUPERSENSE BIOGEL SZ 6.5 (GLOVE) ×5
GOWN STRL REIN XL XLG (GOWN DISPOSABLE) ×12 IMPLANT
HEMOSTAT SNOW SURGICEL 2X4 (HEMOSTASIS) ×2 IMPLANT
INST SET LAPROSCOPIC AP (KITS) ×2 IMPLANT
INST SET MAJOR GENERAL (KITS) ×2 IMPLANT
IV NS IRRIG 3000ML ARTHROMATIC (IV SOLUTION) ×2 IMPLANT
KIT ROOM TURNOVER AP CYSTO (KITS) IMPLANT
LIGASURE 5MM LAPAROSCOPIC (INSTRUMENTS) IMPLANT
LIGASURE IMPACT 36 18CM CVD LR (INSTRUMENTS) ×2 IMPLANT
LIGASURE LAP ATLAS 10MM 37CM (INSTRUMENTS) ×2 IMPLANT
MANIFOLD NEPTUNE II (INSTRUMENTS) ×2 IMPLANT
NEEDLE HYPO 18GX1.5 BLUNT FILL (NEEDLE) IMPLANT
NS IRRIG 1000ML POUR BTL (IV SOLUTION) ×4 IMPLANT
PACK LAP CHOLE LZT030E (CUSTOM PROCEDURE TRAY) ×4 IMPLANT
PAD ARMBOARD 7.5X6 YLW CONV (MISCELLANEOUS) ×2 IMPLANT
PENCIL HANDSWITCHING (ELECTRODE) ×2 IMPLANT
RELOAD LINEAR CUT PROX 55 BLUE (ENDOMECHANICALS) IMPLANT
RELOAD PROXIMATE 75MM BLUE (ENDOMECHANICALS) ×6 IMPLANT
RELOAD STAPLE TA45 3.5 REG BLU (ENDOMECHANICALS) ×2 IMPLANT
SEALER TISSUE G2 CVD JAW 35 (ENDOMECHANICALS) IMPLANT
SEALER TISSUE G2 CVD JAW 45CM (ENDOMECHANICALS)
SET BASIN LINEN APH (SET/KITS/TRAYS/PACK) ×2 IMPLANT
SET TUBE IRRIG SUCTION NO TIP (IRRIGATION / IRRIGATOR) ×2 IMPLANT
SHEET LAVH (DRAPES) ×2 IMPLANT
SPONGE GAUZE 2X2 8PLY STRL LF (GAUZE/BANDAGES/DRESSINGS) ×4 IMPLANT
SPONGE GAUZE 4X4 12PLY (GAUZE/BANDAGES/DRESSINGS) ×2 IMPLANT
SPONGE LAP 18X18 X RAY DECT (DISPOSABLE) ×6 IMPLANT
STAPLER GUN LINEAR PROX 60 (STAPLE) ×2 IMPLANT
STAPLER PROXIMATE 55 BLUE (STAPLE) IMPLANT
STAPLER PROXIMATE 75MM BLUE (STAPLE) ×2 IMPLANT
STAPLER VISISTAT (STAPLE) ×2 IMPLANT
SUCTION POOLE TIP (SUCTIONS) ×2 IMPLANT
SUT CHROMIC 0 CT 1 (SUTURE) IMPLANT
SUT CHROMIC 2 0 SH (SUTURE) IMPLANT
SUT PDS AB CT VIOLET #0 27IN (SUTURE) ×2 IMPLANT
SUT SILK 2 0 (SUTURE)
SUT SILK 2-0 18XBRD TIE 12 (SUTURE) IMPLANT
SUT SILK 3 0 SH CR/8 (SUTURE) ×2 IMPLANT
SUT VIC AB 0 CT1 27 (SUTURE)
SUT VIC AB 0 CT1 27XCR 8 STRN (SUTURE) IMPLANT
SUT VIC AB 2-0 CT2 27 (SUTURE) IMPLANT
SUT VICRYL 0 UR6 27IN ABS (SUTURE) ×2 IMPLANT
SYR BULB IRRIGATION 50ML (SYRINGE) ×2 IMPLANT
SYS LAPSCP GELPORT 120MM (MISCELLANEOUS) ×2
SYSTEM LAPSCP GELPORT 120MM (MISCELLANEOUS) ×1 IMPLANT
TAPE CLOTH SURG 4X10 WHT LF (GAUZE/BANDAGES/DRESSINGS) ×2 IMPLANT
TRAY FOLEY CATH 14FR (SET/KITS/TRAYS/PACK) ×2 IMPLANT
TROCAR Z-THRD FIOS HNDL 11X100 (TROCAR) ×2 IMPLANT
TROCAR Z-THREAD SLEEVE 11X100 (TROCAR) ×2 IMPLANT
TUBING HI FLO HEAT INSUFFLATOR (IRRIGATION / IRRIGATOR) ×2 IMPLANT
WARMER LAPAROSCOPE (MISCELLANEOUS) ×2 IMPLANT
YANKAUER SUCT BULB TIP 10FT TU (MISCELLANEOUS) ×2 IMPLANT

## 2012-01-15 NOTE — Anesthesia Preprocedure Evaluation (Signed)
Anesthesia Evaluation  Patient identified by MRN, date of birth, ID band Patient awake    Reviewed: Allergy & Precautions, H&P , NPO status , Patient's Chart, lab work & pertinent test results  Airway Mallampati: II TM Distance: >3 FB     Dental  (+) Teeth Intact   Pulmonary former smoker,  breath sounds clear to auscultation        Cardiovascular + CAD and + Cardiac Stents Rhythm:Regular Rate:Normal     Neuro/Psych    GI/Hepatic PUD, GERD-  Medicated and Controlled,  Endo/Other    Renal/GU      Musculoskeletal   Abdominal   Peds  Hematology   Anesthesia Other Findings   Reproductive/Obstetrics                           Anesthesia Physical Anesthesia Plan  ASA: III  Anesthesia Plan: General   Post-op Pain Management:    Induction: Intravenous  Airway Management Planned: Oral ETT  Additional Equipment:   Intra-op Plan:   Post-operative Plan: Extubation in OR  Informed Consent: I have reviewed the patients History and Physical, chart, labs and discussed the procedure including the risks, benefits and alternatives for the proposed anesthesia with the patient or authorized representative who has indicated his/her understanding and acceptance.     Plan Discussed with:   Anesthesia Plan Comments:         Anesthesia Quick Evaluation

## 2012-01-15 NOTE — Progress Notes (Signed)
UR Chart Review Completed  

## 2012-01-15 NOTE — Transfer of Care (Signed)
Immediate Anesthesia Transfer of Care Note  Patient: Frank Lin  Procedure(s) Performed: Procedure(s) (LRB) with comments: HAND ASSISTED LAPAROSCOPIC COLON RESECTION (N/A) - partial colectomy PARTIAL COLECTOMY (N/A)  Patient Location: PACU  Anesthesia Type:General  Level of Consciousness: awake, alert  and oriented  Airway & Oxygen Therapy: Patient Spontanous Breathing and Patient connected to face mask oxygen  Post-op Assessment: Report given to PACU RN  Post vital signs: Reviewed and stable  Complications: No apparent anesthesia complications

## 2012-01-15 NOTE — Preoperative (Signed)
Beta Blockers   Reason not to administer Beta Blockers:Not Applicable 

## 2012-01-15 NOTE — Addendum Note (Signed)
Addendum  created 01/15/12 1441 by Moshe Salisbury, CRNA   Modules edited:Charges VN

## 2012-01-15 NOTE — Anesthesia Postprocedure Evaluation (Addendum)
  Anesthesia Post-op Note  Patient: Frank Lin  Procedure(s) Performed: Procedure(s) (LRB) with comments: HAND ASSISTED LAPAROSCOPIC COLON RESECTION (N/A) - partial colectomy PARTIAL COLECTOMY (N/A)  Patient Location: PACU  Anesthesia Type:General  Level of Consciousness: awake, alert  and oriented  Airway and Oxygen Therapy: Patient Spontanous Breathing  Post-op Pain: mild  Post-op Assessment: Post-op Vital signs reviewed, Patient's Cardiovascular Status Stable, Respiratory Function Stable, Patent Airway and No signs of Nausea or vomiting  Post-op Vital Signs: Reviewed and stable  Complications: No apparent anesthesia complications 01/16/12  Patient doing well, VSS.  No apparent anesthesia complications.

## 2012-01-15 NOTE — Interval H&P Note (Signed)
History and Physical Interval Note:  01/15/2012 11:28 AM  Frank Lin  has presented today for surgery, with the diagnosis of Colon Neoplasm  The various methods of treatment have been discussed with the patient and family. After consideration of risks, benefits and other options for treatment, the patient has consented to  Procedure(s) (LRB) with comments: HAND ASSISTED LAPAROSCOPIC COLON RESECTION (N/A) as a surgical intervention .  The patient's history has been reviewed, patient examined, no change in status, stable for surgery.  I have reviewed the patient's chart and labs.  Questions were answered to the patient's satisfaction.     [name]

## 2012-01-15 NOTE — Op Note (Signed)
Patient:  Frank Lin  DOB:  04/25/1950  MRN:  [id number]   Preop Diagnosis:  Multiple adenomatous polyps,colon  Postop Diagnosis:  Same  Procedure:  Laparoscopic hand-assisted partial colectomy with removal of terminal ileum  Surgeon:  [name], M.D.  Anes:  General endotracheal  Indications:  Patient is a 61 year old white male who was found on colonoscopy by Dr. Cletus Gash to have multiple adenomatous polyps in the descending, transverse, and proximal descending colon regions. Although none were dysplastic, they were too numerous to removed. It has been recommended that the patient undergo a subtotal colectomy. The risks and benefits of the procedure including bleeding, infection, cardiopulmonary difficulties, the possibility of an open procedure, the possibiitly of a blood transfusion were fully explained to the patient, gave informed consent.  Procedure note:  The patient was placed in the low lithotomy position after induction of general endotracheal anesthesia. The abdomen was prepped and draped using usual sterile technique with DuraPrep. Surgical site confirmation was performed.  A lower midline incision was made down to the fascia. The peritoneal cavity was entered into without difficulty. A GelPort was then inserted. An additional 11 mm trocar was placed in the mid left abdominal region and a 11 placed in the right mid abdominal region under direct visualization. The abdomen was insufflated to 16 mm mercury pressure. Multiple adhesions were noted in the upper abdomen due to 2 previous gastric surgery. These were freed away using the LigaSure. The peritoneal reflection along the descending and descending colon regions were lysed using Bovie electrocautery. A GIA stapler was placed across the terminal ileum and the proximal sigmoid colon. The remaining colon mesentery was then divided using the LigaSure. This was done both laparoscopically and then ultimately through the GelPort  opening. The specimen was sent to pathology further examination. This limits flexure was inspected no abnormal bleeding was noted. There was a small bowel bleeding from an abrasion to the right lobe of the liver. This was cauterized and Surgicel snow was then placed in this region. A side to side ileocolic anastomosis was then performed using a GIA 70 stapler. The enterotomy was closed using a TA 60 stapler. The stapler was posterior using 3-0 silk sutures. The bowel was returned into the abdominal cavity an orderly fashion. All scrubbed personnel they changed their gloves.  The abdominal cavity was copious irrigated normal saline. All fluid was then evacuated from the abdominal cavity. The abdomen was inspected no abnormal bleeding was noted. A widely patent anastomosis was found. The fascia was from the GelPort site was reapproximated using an 0 PDS running suture. All skin incisions were irrigated normal saline and closed using staples. 0.5% Sensorcaine was instilled the surrounding incisions. Betadine ointment dry sterile dressings were applied.  All tape and needle counts were correct at the end of the procedure. Patient was extubated in the operating room and went back to recovery room awake in stable condition.  Complications:  None  EBL:  300 cc  Specimen:  Colon

## 2012-01-16 ENCOUNTER — Encounter (HOSPITAL_COMMUNITY): Payer: Self-pay | Admitting: General Surgery

## 2012-01-16 LAB — CBC
Platelets: 165 10*3/uL (ref 150–400)
RBC: 4.12 MIL/uL — ABNORMAL LOW (ref 4.22–5.81)
RDW: 16 % — ABNORMAL HIGH (ref 11.5–15.5)
WBC: 7.5 10*3/uL (ref 4.0–10.5)

## 2012-01-16 LAB — PHOSPHORUS: Phosphorus: 3.6 mg/dL (ref 2.3–4.6)

## 2012-01-16 LAB — BASIC METABOLIC PANEL
Chloride: 105 mEq/L (ref 96–112)
GFR calc Af Amer: >90 mL/min (ref >90)
GFR calc non Af Amer: >90 mL/min (ref >90)
Glucose, Bld: 114 mg/dL — ABNORMAL HIGH (ref 70–99)
Potassium: 3.6 mEq/L (ref 3.5–5.1)
Sodium: 138 mEq/L (ref 135–145)

## 2012-01-16 MED ORDER — HYDROCODONE-ACETAMINOPHEN 5-325 MG PO TABS
1.0000 | ORAL_TABLET | ORAL | Status: DC | PRN
  Administered 2012-01-16: 2 via ORAL
  Filled 2012-01-16: qty 2

## 2012-01-16 MED ORDER — KCL IN DEXTROSE-NACL 40-5-0.45 MEQ/L-%-% IV SOLN
INTRAVENOUS | Status: DC
  Administered 2012-01-16 – 2012-01-18 (×4): via INTRAVENOUS

## 2012-01-16 NOTE — Progress Notes (Signed)
Report called to Earnstine Regal, RN. Patient ready for transfer to room 303, no acute distress noted.  Wife at the bedside.

## 2012-01-16 NOTE — Progress Notes (Signed)
1 Day Post-Op  Subjective: Feels good. Minimal incisional pain.  Objective: Vital signs in last 24 hours: Temp:  [97.4 F (36.3 C)-98.3 F (36.8 C)] 98.3 F (36.8 C) (10/31 0648) Pulse Rate:  [65-100] 70  (10/31 0648) Resp:  [13-55] 18  (10/31 0648) BP: (108-156)/(64-90) 110/68 mmHg (10/31 0648) SpO2:  [94 %-100 %] 95 % (10/31 0000) Last BM Date: 01/14/12  Intake/Output from previous day: 10/30 0701 - 10/31 0700 In: 5050 [I.V.:4750; IV Piggyback:300] Out: 2600 [Urine:2300; Blood:300] Intake/Output this shift: Total I/O In: -  Out: 200 [Urine:200]  General appearance: alert, cooperative and no distress Resp: clear to auscultation bilaterally Cardio: regular rate and rhythm, S1, S2 normal, no murmur, click, rub or gallop GI: Soft. Flat. Dressings dry and intact. No significant bowel sounds.  Lab Results:   Basename 01/16/12 0451 01/15/12 1442  WBC 7.5 --  HGB 11.3* 12.7*  HCT 34.4* 38.3*  PLT 165 --   BMET  Basename 01/16/12 0451  NA 138  K 3.6  CL 105  CO2 26  GLUCOSE 114*  BUN 8  CREATININE 0.75  CALCIUM 8.1*   PT/INR No results found for this basename: LABPROT:2,INR:2 in the last 72 hours  Studies/Results: No results found.  Anti-infectives: Anti-infectives     Start     Dose/Rate Route Frequency Ordered Stop   01/15/12 0931   ertapenem (INVANZ) 1 g in sodium chloride 0.9 % 50 mL IVPB        1 g 100 mL/hr over 30 Minutes Intravenous On call to O.R. 01/15/12 0931 01/15/12 1152          Assessment/Plan: s/p Procedure(s): HAND ASSISTED LAPAROSCOPIC COLON RESECTION PARTIAL COLECTOMY Impression: Stable, status post laparoscopic colon resection. Foley has been removed. We'll advance to full liquid diet. We'll start ambulating.  LOS: 1 day    Frank Lin A 01/16/2012

## 2012-01-16 NOTE — Progress Notes (Signed)
INITIAL ADULT NUTRITION ASSESSMENT Date: 01/16/2012   Time: 10:36 AM Reason for Assessment: Malnutrition Screen  ASSESSMENT: Male 61 y.o.  OZ:[id number] dysplastic colon polyps   Past Medical History  Diagnosis Date  . Peptic ulcer 1971  . CAD in native artery   . Pancytopenia 11/28/2011  . B12 deficiency 11/29/2011  . Anemia 11/28/2011  . Colon polyps 11/30/2011  . Leukopenia 11/30/2011  . Unintentional weight loss 11/28/2011  . GERD (gastroesophageal reflux disease)     Scheduled Meds:   . acetaminophen  1,000 mg Intravenous Q6H  . alvimopan  12 mg Oral BID  . enoxaparin  40 mg Subcutaneous Q24H  . ertapenem  1 g Intravenous On Call to OR  . glycopyrrolate  0.2 mg Intravenous Once  . ondansetron (ZOFRAN) IV  4 mg Intravenous Once  . DISCONTD: chlorhexidine  1 application Topical Once   Continuous Infusions:   . dextrose 5 % and 0.45 % NaCl with KCl 40 mEq/L 75 mL/hr at 01/16/12 1000  . DISCONTD: lactated ringers 1,000 mL (01/15/12 1531)  . DISCONTD: lactated ringers 125 mL/hr at 01/16/12 0900   PRN Meds:.HYDROcodone-acetaminophen, HYDROmorphone (DILAUDID) injection, ondansetron (ZOFRAN) IV, ondansetron, DISCONTD: bupivacaine, DISCONTD: fentaNYL, DISCONTD: hemostatic agents, DISCONTD: midazolam, DISCONTD: ondansetron (ZOFRAN) IV, DISCONTD: povidone-iodine, DISCONTD: sodium chloride irrigation, DISCONTD: sodium chloride irrigation  Ht: 5' 7.5" (171.5 cm)  Wt: 162 lb (73.483 kg)  Ideal Wt:  67.25 kg % Ideal Wt: 110%  Usual Wt: 180#  (81.8 kg) % Usual Wt: 90%  Body mass index is 25.00 kg/(m^2). Overweight  Food/Nutrition Related Hx: Pt s/p partial colectomy with removal of terminal ileum. He reports hx of unitentional wt loss this year of ~ 20#, 10% x 8 months which is not significant. He is tolerating full liquids and refuses oral supplement at this time. He feels that his appetite is sufficient to consume adequate nutrition through his meals and snacks once is diet is  advanced.   IVF-D5 1/2 NaCL with KCl @ 75 ml/hr provides ~300 kcal/day.   He does not meet criteria for malnutrition at this time. Will re-evaluate as indicated.   CMP     Component Value Date/Time   NA 138 01/16/2012 0451   K 3.6 01/16/2012 0451   CL 105 01/16/2012 0451   CO2 26 01/16/2012 0451   GLUCOSE 114* 01/16/2012 0451   BUN 8 01/16/2012 0451   CREATININE 0.75 01/16/2012 0451   CREATININE 0.71 11/28/2011 0837   CALCIUM 8.1* 01/16/2012 0451   PROT 6.2 12/03/2011 1047   ALBUMIN 3.4* 12/03/2011 1047   AST 16 12/03/2011 1047   ALT 9 12/03/2011 1047   ALKPHOS 85 12/03/2011 1047   BILITOT 1.1 12/03/2011 1047   GFRNONAA >90 01/16/2012 0451   GFRAA >90 01/16/2012 0451    Intake/Output Summary (Last 24 hours) at 01/16/12 1045 Last data filed at 01/16/12 1000  Gross per 24 hour  Intake 5932.5 ml  Output   2800 ml  Net 3132.5 ml     Diet Order: Full Liquid  Supplements/Tube Feeding:none at this time  IVF:    dextrose 5 % and 0.45 % NaCl with KCl 40 mEq/L Last Rate: 75 mL/hr at 01/16/12 1000  DISCONTD: lactated ringers Last Rate: 1,000 mL (01/15/12 1531)  DISCONTD: lactated ringers Last Rate: 125 mL/hr at 01/16/12 0900    Estimated Nutritional Needs:   Kcal:2190-2555 kcal Protein:102-117 gr Fluid:1 ml/kcal  NUTRITION DIAGNOSIS: -Inadequate oral intake (NI-2.1).  Status: Ongoing  RELATED TO: Multiple dysplastic colon polyps  AS EVIDENCE BY: unintentional wt loss 20# x 8 mo, s/p partial colectomy with removal of terminal ileum.  MONITORING/EVALUATION(Goals): Monitor: diet advancement and tolerance, adequacy of po intake Goal: Pt to meet >/= 90% of their estimated nutrition needs; not met  EDUCATION NEEDS: -Education not appropriate at this time  INTERVENTION: Diet advancement per MD   Dietitian [phone]  DOCUMENTATION CODES Per approved criteria  -Not Applicable    Francene Boyers 01/16/2012, 10:36 AM

## 2012-01-16 NOTE — Care Management Note (Unsigned)
    Page 1 of 1   01/16/2012     3:59:06 PM   CARE MANAGEMENT NOTE 01/16/2012  Patient:  Frank Lin, Frank Lin   Account Number:  [id number]  Date Initiated:  01/16/2012  Documentation initiated by:  Sharrie Rothman  Subjective/Objective Assessment:   Pt admitted from home s/p partial colectomy. pt lives with wife and will return home at discharge. Pt is independent with ADL's.     Action/Plan:   No CM or HH needs noted.   Anticipated DC Date:  01/19/2012   Anticipated DC Plan:  HOME/SELF CARE      DC Planning Services  CM consult      Choice offered to / List presented to:             Status of service:  Completed, signed off Medicare Important Message given?   (If response is "NO", the following Medicare IM given date fields will be blank) Date Medicare IM given:   Date Additional Medicare IM given:    Discharge Disposition:    Per UR Regulation:    If discussed at Long Length of Stay Meetings, dates discussed:    Comments:  01/16/12 1600 Arlyss Queen, RN BSN CM

## 2012-01-16 NOTE — Addendum Note (Signed)
Addendum  created 01/16/12 1442 by Moshe Salisbury, CRNA   Modules edited:Notes Section

## 2012-01-17 LAB — BASIC METABOLIC PANEL
BUN: 5 mg/dL — ABNORMAL LOW (ref 6–23)
Chloride: 103 mEq/L (ref 96–112)
GFR calc Af Amer: >90 mL/min (ref >90)
Glucose, Bld: 111 mg/dL — ABNORMAL HIGH (ref 70–99)
Potassium: 4.1 mEq/L (ref 3.5–5.1)

## 2012-01-17 LAB — CBC
HCT: 34 % — ABNORMAL LOW (ref 39.0–52.0)
Hemoglobin: 11.1 g/dL — ABNORMAL LOW (ref 13.0–17.0)
RBC: 4.09 MIL/uL — ABNORMAL LOW (ref 4.22–5.81)
WBC: 8.2 10*3/uL (ref 4.0–10.5)

## 2012-01-17 NOTE — Progress Notes (Signed)
2 Days Post-Op  Subjective: Feels good. Tolerating full liquid diet well. No bowel movement or flatus yet.  Objective: Vital signs in last 24 hours: Temp:  [98.2 F (36.8 C)-98.4 F (36.9 C)] 98.4 F (36.9 C) (11/01 0500) Pulse Rate:  [68-87] 87  (11/01 0500) Resp:  [19-20] 20  (11/01 0500) BP: (120-142)/(72-80) 142/80 mmHg (11/01 0500) SpO2:  [94 %-97 %] 94 % (11/01 0500) Weight:  [73.483 kg (162 lb)-160.4 kg (353 lb 9.9 oz)] 160.4 kg (353 lb 9.9 oz) (10/31 1714) Last BM Date: 01/14/12  Intake/Output from previous day: 10/31 0701 - 11/01 0700 In: 2007.5 [P.O.:240; I.V.:1767.5] Out: 1150 [Urine:1150] Intake/Output this shift:    General appearance: alert, cooperative and no distress Resp: clear to auscultation bilaterally Cardio: regular rate and rhythm, S1, S2 normal, no murmur, click, rub or gallop GI: Soft, nondistended. Incisions healing well. Bowel sounds appreciated.  Lab Results:   [facility] 01/17/12 0448 01/16/12 0451  WBC 8.2 7.5  HGB 11.1* 11.3*  HCT 34.0* 34.4*  PLT 160 165   BMET  Basename 01/17/12 0448 01/16/12 0451  NA 137 138  K 4.1 3.6  CL 103 105  CO2 26 26  GLUCOSE 111* 114*  BUN 5* 8  CREATININE 0.70 0.75  CALCIUM 8.6 8.1*   PT/INR No results found for this basename: LABPROT:2,INR:2 in the last 72 hours  Studies/Results: No results found.  Anti-infectives: Anti-infectives     Start     Dose/Rate Route Frequency Ordered Stop   01/15/12 0931   ertapenem (INVANZ) 1 g in sodium chloride 0.9 % 50 mL IVPB        1 g 100 mL/hr over 30 Minutes Intravenous On call to O.R. 01/15/12 0931 01/15/12 1152          Assessment/Plan: s/p Procedure(s): HAND ASSISTED LAPAROSCOPIC COLON RESECTION PARTIAL COLECTOMY Impression: Stable postoperatively 2. On full liquid diet. Awaiting return of bowel function. Final pathology pending.  LOS: 2 days    Adrienne Delay A 01/17/2012

## 2012-01-18 MED ORDER — OXYCODONE-ACETAMINOPHEN 7.5-325 MG PO TABS: 1.0000 | ORAL_TABLET | ORAL | Status: DC | PRN

## 2012-01-18 NOTE — Progress Notes (Signed)
Patient was given discharge instructions along with follow up appointments and prescriptions. Patient verbalized understanding of all instructions. Patient was escorted by staff via wheelchair to vehicle. Patient discharged to home in stable condition. 

## 2012-01-18 NOTE — Discharge Summary (Signed)
Physician Discharge Summary  Patient ID: Frank Lin MRN: [id number] DOB/AGE: 1951/02/26 61 y.o.  Admit date: 01/15/2012 Discharge date: 01/18/2012  Admission Diagnoses:  Multiple colon polyps  Discharge Diagnoses: Same Active Problems:  * No active hospital problems. *    Discharged Condition: good  Hospital Course: Patient is a 61 year old white male who was found on colonoscopy to have multiple colon polyps throughout the descending and transverse colon. They could not be fully removed. The patient was referred for surgery. The patient underwent an extended laparoscopic hand-assisted right hemicolectomy on 01/15/2012. He had an anastomosis from his terminal ileum to the sigmoid colon. He tolerated the procedure well. His postoperative course was unremarkable. His diet was advanced at difficulty once his bowel function returned. Final pathology revealed multiple tubular adenomas. None were dysplastic or malignant. All lymph nodes were negative. He is being discharged home in good improving condition.  Treatments: surgery: Laparoscopic hand-assisted extended right hemicolectomy on 01/15/2012   Discharge Exam: Blood pressure 137/67, pulse 71, temperature 98.3 F (36.8 C), temperature source Oral, resp. rate 20, height 5\' 7"  (1.702 m), weight 160.4 kg (353 lb 9.9 oz), SpO2 97.00%. General appearance: alert, cooperative and no distress Resp: clear to auscultation bilaterally Cardio: regular rate and rhythm, S1, S2 normal, no murmur, click, rub or gallop GI: Soft, flat. Incisions healing well. Good bowel sounds appreciated.  Disposition: 01-Home or Self Care  Discharge Orders    Future Appointments: Provider: Department: Dept Phone: Center:   03/02/2012 8:40 AM [facility] [phone] None   05/04/2012 8:30 AM [facility] [phone] None   05/06/2012 9:30 AM Randall An, MD [facility] [phone] None       Medication List     As  of 01/18/2012  9:15 AM    TAKE these medications         cyanocobalamin 1000 MCG/ML injection   Commonly known as: (VITAMIN B-12)   Inject 1,000 mcg into the skin every 30 (thirty) days.      ibuprofen 200 MG tablet   Commonly known as: ADVIL,MOTRIN   Take 200 mg by mouth every 6 (six) hours as needed. Used rarely for pain      omeprazole 20 MG capsule   Commonly known as: PRILOSEC   Take 20 mg by mouth daily.      oxyCODONE-acetaminophen 7.5-325 MG per tablet   Commonly known as: PERCOCET   Take 1-2 tablets by mouth every 4 (four) hours as needed for pain.           Follow-up Information    Follow up with Dalia Heading, MD. Schedule an appointment as soon as possible for a visit on 01/23/2012.   Contact information:   [address] [place] [address] [phone]          Signed: [name] 01/18/2012, 9:15 AM

## 2012-01-19 LAB — TYPE AND SCREEN: Unit division: 0

## 2012-03-02 ENCOUNTER — Encounter (HOSPITAL_COMMUNITY): Payer: BC Managed Care – PPO | Attending: Family Medicine

## 2012-03-02 DIAGNOSIS — E538 Deficiency of other specified B group vitamins: Secondary | ICD-10-CM | POA: Insufficient documentation

## 2012-03-02 LAB — CBC WITH DIFFERENTIAL/PLATELET
Eosinophils Relative: 4 % (ref 0–5)
HCT: 44 % (ref 39.0–52.0)
Lymphocytes Relative: 46 % (ref 12–46)
Lymphs Abs: 2.8 10*3/uL (ref 0.7–4.0)
MCV: 78.4 fL (ref 78.0–100.0)
Platelets: 183 10*3/uL (ref 150–400)
RBC: 5.61 MIL/uL (ref 4.22–5.81)
WBC: 6.2 10*3/uL (ref 4.0–10.5)

## 2012-03-02 LAB — VITAMIN B12: Vitamin B-12: 278 pg/mL (ref 211–911)

## 2012-03-02 NOTE — Progress Notes (Signed)
Labs drawn today for cbc/diff,vb12,,ldh

## 2012-05-04 ENCOUNTER — Encounter (HOSPITAL_COMMUNITY): Payer: BC Managed Care – PPO | Attending: Family Medicine

## 2012-05-04 DIAGNOSIS — E538 Deficiency of other specified B group vitamins: Secondary | ICD-10-CM | POA: Insufficient documentation

## 2012-05-04 LAB — CBC WITH DIFFERENTIAL/PLATELET
Hemoglobin: 15.2 g/dL (ref 13.0–17.0)
Lymphocytes Relative: 43 % (ref 12–46)
Lymphs Abs: 2.7 10*3/uL (ref 0.7–4.0)
MCH: 28.4 pg (ref 26.0–34.0)
Monocytes Relative: 8 % (ref 3–12)
Neutro Abs: 2.7 10*3/uL (ref 1.7–7.7)
Neutrophils Relative %: 45 % (ref 43–77)
RBC: 5.36 MIL/uL (ref 4.22–5.81)

## 2012-05-04 LAB — RETICULOCYTES
RBC.: 5.36 MIL/uL (ref 4.22–5.81)
Retic Count, Absolute: 80.4 10*3/uL (ref 19.0–186.0)
Retic Ct Pct: 1.5 % (ref 0.4–3.1)

## 2012-05-04 LAB — SEDIMENTATION RATE: Sed Rate: 4 mm/hr (ref 0–16)

## 2012-05-04 LAB — LACTATE DEHYDROGENASE: LDH: 130 U/L (ref 94–250)

## 2012-05-04 NOTE — Progress Notes (Signed)
Labs drawn today for cbc/diff,vb12,retic,ldh,sed rate

## 2012-05-06 ENCOUNTER — Encounter (HOSPITAL_BASED_OUTPATIENT_CLINIC_OR_DEPARTMENT_OTHER): Payer: BC Managed Care – PPO | Admitting: Oncology

## 2012-05-06 ENCOUNTER — Encounter (HOSPITAL_COMMUNITY): Payer: Self-pay | Admitting: Oncology

## 2012-05-06 VITALS — BP 127/82 | HR 86 | Temp 98.0°F | Resp 18 | Wt 167.0 lb

## 2012-05-06 DIAGNOSIS — E538 Deficiency of other specified B group vitamins: Secondary | ICD-10-CM

## 2012-05-06 MED ORDER — CYANOCOBALAMIN 1000 MCG/ML IJ SOLN: 1000.0000 ug | INTRAMUSCULAR | Status: DC

## 2012-05-06 NOTE — Patient Instructions (Addendum)
[facility] Discharge Instructions  RECOMMENDATIONS MADE BY THE CONSULTANT AND ANY TEST RESULTS WILL BE SENT TO YOUR REFERRING PHYSICIAN.  EXAM FINDINGS BY THE PHYSICIAN TODAY AND SIGNS OR SYMPTOMS TO REPORT TO CLINIC OR PRIMARY PHYSICIAN: Exam and discussion by MD.  Your blood counts are good but your B12 level is a low normal and we want it higher.  Take an extra B12 injection.  MEDICATIONS PRESCRIBED:  Refill for B12  INSTRUCTIONS GIVEN AND DISCUSSED: Report increased fatigue or shortness of breath, etc.  SPECIAL INSTRUCTIONS/FOLLOW-UP: Follow-up in 8 months after blood work.  Thank you for choosing [facility] to provide your oncology and hematology care.  To afford each patient quality time with our providers, please arrive at least 15 minutes before your scheduled appointment time.  With your help, our goal is to use those 15 minutes to complete the necessary work-up to ensure our physicians have the information they need to help with your evaluation and healthcare recommendations.    Effective January 1st, 2014, we ask that you re-schedule your appointment with our physicians should you arrive 10 or more minutes late for your appointment.  We strive to give you quality time with our providers, and arriving late affects you and other patients whose appointments are after yours.    Again, thank you for choosing [facility].  Our hope is that these requests will decrease the amount of time that you wait before being seen by our physicians.       _____________________________________________________________  Should you have questions after your visit to [facility], please contact our office at [phone] between the hours of 8:30 a.m. and 5:00 p.m.  Voicemails left after 4:30 p.m. will not be returned until the following business day.  For prescription refill requests, have your pharmacy contact our office with your  prescription refill request.

## 2012-05-06 NOTE — Progress Notes (Signed)
#  1 pernicious anemia diagnosed in September 2013 with pancytopenia and extremely low vitamin B12 level. He is on replacement B12 once a month presently but his B12 level is at the low end of normal. His counts remain in the normal range presently but I do want him to be in the high end of the normal range or just above that so that we feel comfortable that he is completely replaced. He appears normal from a cerebral standpoint. He is working full-time. He feels so much better overall he states. Also of note is that his methylmalonic acid level when checked last was in the normal range. This is all reassuring but I still want his B12 level higher than it is presently. He will therefore take an extra dose this month and next month. We will check his blood work and B12 level in October. I've refilled his B12 injections on a when necessary basis.

## 2012-07-29 ENCOUNTER — Telehealth: Payer: Self-pay

## 2012-07-29 NOTE — Telephone Encounter (Signed)
NIC in computer

## 2012-07-29 NOTE — Telephone Encounter (Signed)
Message copied by Tonye Pearson on Wed Jul 29, 2012  2:51 PM ------      Message from: Corbin Ade      Created: Wed Jul 29, 2012  1:11 PM       Strictly speaking, he did not end up with adenocarcinoma. However, because he is so many polyps, ultimately requiring surgery, I feel we should bring him back for colonoscopy in one year (from the time of surgery). That would be October 2014. Thanks.      ----- Message -----         From: Tonye Pearson, CMA         Sent: 07/29/2012  10:48 AM           To: Corbin Ade, MD            Noticed this patient was not on the recall list. When do you think he need a follow up OV or TCS?            Thanks      Cheyenna Pankowski       ------

## 2013-01-04 ENCOUNTER — Encounter (HOSPITAL_COMMUNITY): Payer: BC Managed Care – PPO | Attending: Hematology and Oncology

## 2013-01-04 DIAGNOSIS — E538 Deficiency of other specified B group vitamins: Secondary | ICD-10-CM | POA: Insufficient documentation

## 2013-01-04 DIAGNOSIS — D649 Anemia, unspecified: Secondary | ICD-10-CM | POA: Insufficient documentation

## 2013-01-04 LAB — CBC WITH DIFFERENTIAL/PLATELET
Basophils Absolute: 0 10*3/uL (ref 0.0–0.1)
Basophils Relative: 0 % (ref 0–1)
Eosinophils Absolute: 0.2 10*3/uL (ref 0.0–0.7)
MCHC: 35.8 g/dL (ref 30.0–36.0)
Neutro Abs: 3.9 10*3/uL (ref 1.7–7.7)
Neutrophils Relative %: 56 % (ref 43–77)
RDW: 14.2 % (ref 11.5–15.5)

## 2013-01-04 NOTE — Progress Notes (Signed)
Labs drawn today for cbc/diff,b12

## 2013-01-05 NOTE — Progress Notes (Signed)
Frank Ruths, MD [address] [place] [address]  B12 deficiency - Plan: CBC with Differential, Vitamin B12, Methylmalonic acid, serum  Pancytopenia  Leukopenia  Anemia  CURRENT THERAPY: Vitamin B12 1000 mcg every month.  INTERVAL HISTORY: Frank Lin 62 y.o. male returns for  regular  visit for followup of pernicious anemia diagnosed in September 2013 with pancytopenia and extremely low vitamin B12 level. He is on replacement B12 once a month presently but his B12 level is at the low end of normal.   On his last encounter, Dr. Mariel Lin increased Frank Lin's Vitamin B 12 1000 mcg to twice monthly x 2 months and then back to usual dose of monthly Vitamin B12 1000 mcg monthly.  With this bump up in Vitamin B12, Frank Lin's Vitamin B12 is up to 729 (compared to mid 200s).    I personally reviewed and went over laboratory results with the patient.  In addition to Vit B12, CBC is WNL with WBC of 7.0, Hgb of 16.9 g/dL and platelet count of 469,629.  RDW is WNL at 14.2 which is WNL.    He denies any complaints and feel well.  Hematologically, he denies any complaints and ROS questionings is negative.  He denies any B symptoms and denies any changes in his appetite.     Past Medical History  Diagnosis Date  . Peptic ulcer 1971  . CAD in native artery   . Pancytopenia 11/28/2011  . B12 deficiency 11/29/2011  . Anemia 11/28/2011  . Colon polyps 11/30/2011  . Leukopenia 11/30/2011  . Unintentional weight loss 11/28/2011  . GERD (gastroesophageal reflux disease)     has Unintentional weight loss; Heme positive stool; Tobacco use disorder; PUD (peptic ulcer disease); B12 deficiency; and Colon polyps on his problem list.     has No Known Allergies.  Frank Lin.  Past Surgical History  Procedure Laterality Date  . Appendectomy  1970  . Cardiac catheterization  1996    s/p PTCA/stent (Dr Frank Lin)  . Stomach  surgery  1971    Hx PUD   . Septoplasty    . Colonoscopy  12/30/2011    Procedure: COLONOSCOPY;  Surgeon: Frank Ade, MD;  [facility];  Service: Endoscopy;  Laterality: N/A;  1:45/Need to schedule double time; will be a long case  . Colon resection  01/15/2012    Procedure: HAND ASSISTED LAPAROSCOPIC COLON RESECTION;  Surgeon: Frank Heading, MD;  Location: [facility] OR[facility];  Service: General;  Laterality: N/A;  partial colectomy  . Partial colectomy  01/15/2012    Procedure: PARTIAL COLECTOMY;  Surgeon: Frank Heading, MD;  Location: [facility] OR[facility];  Service: General;  Laterality: N/A;    Denies any headaches, dizziness, double vision, fevers, chills, night sweats, nausea, vomiting, diarrhea, constipation, chest pain, heart palpitations, shortness of breath, blood in stool, black tarry stool, urinary pain, urinary burning, urinary frequency, hematuria.   PHYSICAL EXAMINATION  ECOG PERFORMANCE STATUS: 0 - Asymptomatic  Filed Vitals:   01/06/13 0900  BP: 171/89  Pulse: 76  Temp: 97.8 F (36.6 C)  Resp: 18    GENERAL:alert, healthy, no distress, well nourished, well developed, comfortable, cooperative and smiling SKIN: skin color, texture, turgor are normal, no rashes or significant lesions HEAD: Normocephalic, No masses, lesions, tenderness or abnormalities EYES: normal, PERRLA, EOMI, Conjunctiva are pink and non-injected EARS: External ears normal OROPHARYNX:mucous membranes are moist  NECK: supple, no  adenopathy, thyroid normal size, non-tender, without nodularity, no stridor, non-tender, trachea midline LYMPH:  no palpable lymphadenopathy, no hepatosplenomegaly BREAST:not examined LUNGS: clear to auscultation and percussion HEART: regular rate & rhythm, no murmurs, no gallops, S1 normal and S2 normal ABDOMEN:abdomen soft, non-tender, normal bowel sounds, no masses or organomegaly and no hepatosplenomegaly BACK: Back symmetric, no curvature., No CVA tenderness EXTREMITIES:less  then 2 second capillary refill, no joint deformities, effusion, or inflammation, no edema, no skin discoloration, no clubbing, no cyanosis  NEURO: alert & oriented x 3 with fluent speech, no focal motor/sensory deficits, gait normal    LABORATORY DATA: CBC    Component Value Date/Time   WBC 7.0 01/04/2013 0945   RBC 5.44 01/04/2013 0945   RBC 5.36 05/04/2012 0817   HGB 16.9 01/04/2013 0945   HCT 47.2 01/04/2013 0945   HCT 15 11/19/2011 0827   PLT 152 01/04/2013 0945   MCV 86.8 01/04/2013 0945   MCH 31.1 01/04/2013 0945   MCHC 35.8 01/04/2013 0945   RDW 14.2 01/04/2013 0945   LYMPHSABS 2.4 01/04/2013 0945   MONOABS 0.5 01/04/2013 0945   EOSABS 0.2 01/04/2013 0945   BASOSABS 0.0 01/04/2013 0945   Results for REINO, LYBBERT (MRN [id number]) as of 01/05/2013 14:30  Ref. Range 01/04/2013 09:45  Vitamin B-12 Latest Range: 211-911 pg/mL 729       ASSESSMENT:  1. Vitamin B 12 deficiency with short course of increased Vitamin B12 2000 mcg monthly x 2 months in early 2014 with improvement in Vit B12 level. 2. H/O pancytopenia secondary to #1, resolution  Patient Active Problem List   Diagnosis Date Noted  . Colon polyps 11/30/2011  . B12 deficiency 11/29/2011  . Unintentional weight loss 11/28/2011  . Heme positive stool 11/28/2011  . Tobacco use disorder 11/28/2011  . PUD (peptic ulcer disease) 11/28/2011     PLAN:  1. I personally reviewed and went over laboratory results with the patient. 2. Problem list updated to reflect resolution of Pancytopenia, Leukopenia, and anemia. 3. Continue with Vitamin B12 1000 mcg monthly 4. Labs in 6 months: CBC diff, Vitamin B12, Methylmalonic acid 5. Influenza vaccine declined today 6. Return in 6 months for follow-up   THERAPY PLAN:  He will continue with Vitamin B12 1000 mcg monthly and we will continue to follow labs and adjust therapy as needed.    All questions were answered. The patient knows to call the clinic with any  problems, questions or concerns. We can certainly see the patient much sooner if necessary.  Patient and plan discussed with Dr. Alla German and he is in agreement with the aforementioned.   Frank Lin

## 2013-01-06 ENCOUNTER — Encounter (HOSPITAL_COMMUNITY): Payer: Self-pay | Admitting: Oncology

## 2013-01-06 ENCOUNTER — Encounter (HOSPITAL_BASED_OUTPATIENT_CLINIC_OR_DEPARTMENT_OTHER): Payer: BC Managed Care – PPO | Admitting: Oncology

## 2013-01-06 VITALS — BP 171/89 | HR 76 | Temp 97.8°F | Resp 18 | Wt 176.0 lb

## 2013-01-06 DIAGNOSIS — D72819 Decreased white blood cell count, unspecified: Secondary | ICD-10-CM

## 2013-01-06 DIAGNOSIS — E538 Deficiency of other specified B group vitamins: Secondary | ICD-10-CM

## 2013-01-06 DIAGNOSIS — D649 Anemia, unspecified: Secondary | ICD-10-CM

## 2013-01-06 DIAGNOSIS — D61818 Other pancytopenia: Secondary | ICD-10-CM

## 2013-01-06 NOTE — Patient Instructions (Signed)
[facility] Discharge Instructions  RECOMMENDATIONS MADE BY THE CONSULTANT AND ANY TEST RESULTS WILL BE SENT TO YOUR REFERRING PHYSICIAN.  Labs in 6 months. Return to clinic in 6 months to see MD. Continue B12 injections monthly. Report any issues/concerns to clinic as needed prior to appointments.  Thank you for choosing [facility] to provide your oncology and hematology care.  To afford each patient quality time with our providers, please arrive at least 15 minutes before your scheduled appointment time.  With your help, our goal is to use those 15 minutes to complete the necessary work-up to ensure our physicians have the information they need to help with your evaluation and healthcare recommendations.    Effective January 1st, 2014, we ask that you re-schedule your appointment with our physicians should you arrive 10 or more minutes late for your appointment.  We strive to give you quality time with our providers, and arriving late affects you and other patients whose appointments are after yours.    Again, thank you for choosing [facility].  Our hope is that these requests will decrease the amount of time that you wait before being seen by our physicians.       _____________________________________________________________  Should you have questions after your visit to [facility], please contact our office at [phone] between the hours of 8:30 a.m. and 5:00 p.m.  Voicemails left after 4:30 p.m. will not be returned until the following business day.  For prescription refill requests, have your pharmacy contact our office with your prescription refill request.

## 2013-01-21 ENCOUNTER — Other Ambulatory Visit: Payer: Self-pay

## 2013-06-28 ENCOUNTER — Other Ambulatory Visit (HOSPITAL_COMMUNITY): Payer: Self-pay | Admitting: Oncology

## 2013-06-28 ENCOUNTER — Telehealth (HOSPITAL_COMMUNITY): Payer: Self-pay

## 2013-06-28 DIAGNOSIS — E538 Deficiency of other specified B group vitamins: Secondary | ICD-10-CM

## 2013-06-28 MED ORDER — CYANOCOBALAMIN 1000 MCG/ML IJ SOLN: 1000.0000 ug | Freq: Once | INTRAMUSCULAR | Status: DC

## 2013-07-05 ENCOUNTER — Other Ambulatory Visit (HOSPITAL_COMMUNITY): Payer: BC Managed Care – PPO

## 2013-07-07 ENCOUNTER — Ambulatory Visit (HOSPITAL_COMMUNITY): Payer: BC Managed Care – PPO

## 2014-04-18 ENCOUNTER — Other Ambulatory Visit (HOSPITAL_COMMUNITY): Payer: Self-pay

## 2014-04-18 DIAGNOSIS — D61818 Other pancytopenia: Secondary | ICD-10-CM

## 2014-04-18 DIAGNOSIS — E538 Deficiency of other specified B group vitamins: Secondary | ICD-10-CM

## 2014-04-19 ENCOUNTER — Encounter (HOSPITAL_COMMUNITY): Payer: Commercial Managed Care - HMO | Attending: Hematology & Oncology | Admitting: Hematology & Oncology

## 2014-04-19 ENCOUNTER — Encounter (HOSPITAL_COMMUNITY): Payer: Self-pay | Admitting: Hematology & Oncology

## 2014-04-19 ENCOUNTER — Encounter (HOSPITAL_BASED_OUTPATIENT_CLINIC_OR_DEPARTMENT_OTHER): Payer: Commercial Managed Care - HMO

## 2014-04-19 VITALS — BP 156/88 | HR 88 | Temp 98.2°F | Resp 18 | Wt 175.7 lb

## 2014-04-19 DIAGNOSIS — D510 Vitamin B12 deficiency anemia due to intrinsic factor deficiency: Secondary | ICD-10-CM | POA: Insufficient documentation

## 2014-04-19 DIAGNOSIS — E538 Deficiency of other specified B group vitamins: Secondary | ICD-10-CM | POA: Diagnosis not present

## 2014-04-19 DIAGNOSIS — D61818 Other pancytopenia: Secondary | ICD-10-CM

## 2014-04-19 LAB — CBC WITH DIFFERENTIAL/PLATELET
BASOS PCT: 1 % (ref 0–1)
Basophils Absolute: 0 10*3/uL (ref 0.0–0.1)
EOS PCT: 2 % (ref 0–5)
Eosinophils Absolute: 0.1 10*3/uL (ref 0.0–0.7)
HEMATOCRIT: 46 % (ref 39.0–52.0)
HEMOGLOBIN: 16 g/dL (ref 13.0–17.0)
Lymphocytes Relative: 44 % (ref 12–46)
Lymphs Abs: 3.2 10*3/uL (ref 0.7–4.0)
MCH: 29.7 pg (ref 26.0–34.0)
MCHC: 34.8 g/dL (ref 30.0–36.0)
MCV: 85.3 fL (ref 78.0–100.0)
MONO ABS: 0.5 10*3/uL (ref 0.1–1.0)
MONOS PCT: 7 % (ref 3–12)
NEUTROS ABS: 3.4 10*3/uL (ref 1.7–7.7)
Neutrophils Relative %: 46 % (ref 43–77)
Platelets: 175 10*3/uL (ref 150–400)
RBC: 5.39 MIL/uL (ref 4.22–5.81)
RDW: 14.4 % (ref 11.5–15.5)
WBC: 7.3 10*3/uL (ref 4.0–10.5)

## 2014-04-19 MED ORDER — CYANOCOBALAMIN 1000 MCG/ML IJ SOLN: 1000.0000 ug | Freq: Once | INTRAMUSCULAR | Status: DC

## 2014-04-19 NOTE — Progress Notes (Signed)
Frank Grills, MD [place] [address]    DIAGNOSIS: Pernicious anemia on B12 IM, patient self administers.   CURRENT THERAPY: B12 IM  INTERVAL HISTORY: Frank Lin 64 y.o. male returns for follow-up of his pernicious anemia.  He had his last B12 injection in December.  He denies excessive fatigue, no numbness or tingling in the hands or feet and no difficulty with memory.  He denies any problems with his B12 injections. He works at [employer].   MEDICAL HISTORY: Past Medical History  Diagnosis Date  . Peptic ulcer 1971  . CAD in native artery   . Pancytopenia 11/28/2011  . B12 deficiency 11/29/2011  . Anemia 11/28/2011  . Colon polyps 11/30/2011  . Leukopenia 11/30/2011  . Unintentional weight loss 11/28/2011  . GERD (gastroesophageal reflux disease)     has Unintentional weight loss; Heme positive stool; Tobacco use disorder; PUD (peptic ulcer disease); B12 deficiency; and Colon polyps on his problem list.     has No Known Allergies.  Frank Lin had no medications administered during this visit.  SURGICAL HISTORY: Past Surgical History  Procedure Laterality Date  . Appendectomy  1970  . Cardiac catheterization  1996    s/p PTCA/stent (Dr Lia Foyer)  . Stomach surgery  1971    Hx PUD   . Septoplasty    . Colonoscopy  12/30/2011    RMR: Multiple colonic polyps-status post multiple snare polypectomies and polyp debulking. 1 small rectal polyp removed as described above . Sigmoid and rectum looked good endoscopically.   . Colon resection  01/15/2012    Procedure: HAND ASSISTED LAPAROSCOPIC COLON RESECTION;  Surgeon: Jamesetta So, MD;  Location: [facility];  Service: General;  Laterality: N/A;  partial colectomy  . Partial colectomy  01/15/2012    Procedure: PARTIAL COLECTOMY;  Surgeon: Jamesetta So, MD;  Location: [facility]S;  Service: General;  Laterality: N/A;  . Esophagogastroduodenoscopy  11/29/2014    RMR: Normal esophagus. Status  post prior gastric surgery as described above. Abnoraml proximal small bowel status post biopsy. Status psot gastric biopsy.     SOCIAL HISTORY: History   Social History  . Marital Status: Married    Spouse Name: N/A  . Number of Children: 2  . 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 Smoking status: Former Smoker -- 0.50 packs/day for 45 years    Types: Cigarettes    Quit date: 11/27/1996  . Smokeless tobacco: Never Used  . Alcohol Use: No  . Drug Use: No  . Sexual Activity: Yes   Other Topics Concern  . Not on file   Social History Narrative    FAMILY HISTORY: Family History  Problem Relation Age of Onset  . Heart failure Brother     Review of Systems  Constitutional: Negative for fever, chills, weight loss and malaise/fatigue.  HENT: Negative for congestion, hearing loss, nosebleeds, sore throat and tinnitus.   Eyes: Negative for blurred vision, double vision, pain and discharge.  Respiratory: Negative for cough, hemoptysis, sputum production, shortness of breath and wheezing.   Cardiovascular: Negative for chest pain, palpitations, claudication, leg swelling and PND.  Gastrointestinal: Negative for heartburn, nausea, vomiting, abdominal pain, diarrhea, constipation, blood in stool and melena.  Genitourinary: Negative for dysuria, urgency, frequency and hematuria.  Musculoskeletal: Negative for myalgias, joint pain and falls.  Skin: Negative for itching and rash.  Neurological:  Negative for dizziness, tingling, tremors, sensory change, speech change, focal weakness, seizures, loss of consciousness, weakness and headaches.  Endo/Heme/Allergies: Does not bruise/bleed easily.  Psychiatric/Behavioral: Negative for depression, suicidal ideas, memory loss and substance abuse. The patient is not nervous/anxious and does not have insomnia.     PHYSICAL EXAMINATION  ECOG PERFORMANCE STATUS: 0 -  Asymptomatic  Filed Vitals:   04/19/14 1309  BP: 156/88  Pulse: 88  Temp: 98.2 F (36.8 C)  Resp: 18    Physical Exam  Constitutional: He is oriented to person, place, and time and well-developed, well-nourished, and in no distress.  HENT:  Head: Normocephalic and atraumatic.  Nose: Nose normal.  Mouth/Throat: Oropharynx is clear and moist. No oropharyngeal exudate.  Eyes: Conjunctivae and EOM are normal. Pupils are equal, round, and reactive to light. Right eye exhibits no discharge. Left eye exhibits no discharge. No scleral icterus.  Neck: Normal range of motion. Neck supple. No tracheal deviation present. No thyromegaly present.  Cardiovascular: Normal rate, regular rhythm and normal heart sounds.  Exam reveals no gallop and no friction rub.   No murmur heard. Pulmonary/Chest: Effort normal and breath sounds normal. He has no wheezes. He has no rales.  Abdominal: Soft. Bowel sounds are normal. He exhibits no distension and no mass. There is no tenderness. There is no rebound and no guarding.  Musculoskeletal: Normal range of motion. He exhibits no edema.  Lymphadenopathy:    He has no cervical adenopathy.  Neurological: He is alert and oriented to person, place, and time. He has normal reflexes. No cranial nerve deficit. Gait normal. Coordination normal.  Skin: Skin is warm and dry. No rash noted.  Psychiatric: Mood, memory, affect and judgment normal.  Nursing note and vitals reviewed.   LABORATORY DATA:  CBC    Component Value Date/Time   WBC 7.3 04/19/2014 1301   RBC 5.39 04/19/2014 1301   RBC 5.36 05/04/2012 0817   HGB 16.0 04/19/2014 1301   HCT 46.0 04/19/2014 1301   HCT 15 11/19/2011 0827   PLT 175 04/19/2014 1301   MCV 85.3 04/19/2014 1301   MCH 29.7 04/19/2014 1301   MCHC 34.8 04/19/2014 1301   RDW 14.4 04/19/2014 1301   LYMPHSABS 3.2 04/19/2014 1301   MONOABS 0.5 04/19/2014 1301   EOSABS 0.1 04/19/2014 1301   BASOSABS 0.0 04/19/2014 1301   CMP       Component Value Date/Time   NA 137 01/17/2012 0448   K 4.1 01/17/2012 0448   CL 103 01/17/2012 0448   CO2 26 01/17/2012 0448   GLUCOSE 111* 01/17/2012 0448   BUN 5* 01/17/2012 0448   CREATININE 0.70 01/17/2012 0448   CREATININE 0.71 11/28/2011 0837   CALCIUM 8.6 01/17/2012 0448   PROT 6.2 12/03/2011 1047   ALBUMIN 3.4* 12/03/2011 1047   AST 16 12/03/2011 1047   ALT 9 12/03/2011 1047   ALKPHOS 85 12/03/2011 1047   BILITOT 1.1 12/03/2011 1047   GFRNONAA >90 01/17/2012 0448   GFRAA >90 01/17/2012 0448      ASSESSMENT and THERAPY PLAN:    B12 deficiency Pleasant 64 year old male with pernicious anemia. I spent time today explaining pernicious anemia to him in detail. I advised him that he would need life long B12 replacement. I do not feel he has a need to see [place] more than once a year. He is very compliant with his medication. He has recently run out of his medication and I went ahead and refill him for 1 year. I  advised him if he has problems prior to his follow-up in year to let us know. I will keep you advised of his laboratory studies and they are all available.   All questions were answered. The patient knows to call the clinic with any problems, questions or concerns. We can certainly see the patient much sooner if necessary.  Molli Hazard 05/03/2014

## 2014-04-19 NOTE — Patient Instructions (Signed)
[facility] Discharge Instructions  RECOMMENDATIONS MADE BY THE CONSULTANT AND ANY TEST RESULTS WILL BE SENT TO YOUR REFERRING PHYSICIAN.  SPECIAL INSTRUCTIONS/FOLLOW-UP:  Please call prior to your next visit with any problems or concerns. We will see you back again in one year with labs and an office visit. We have called in the following prescriptions:  B12 1900 mcq IM monthly   Thank you for choosing [facility] to provide your oncology and hematology care.  To afford each patient quality time with our providers, please arrive at least 15 minutes before your scheduled appointment time.  With your help, our goal is to use those 15 minutes to complete the necessary work-up to ensure our physicians have the information they need to help with your evaluation and healthcare recommendations.    Effective January 1st, 2014, we ask that you re-schedule your appointment with our physicians should you arrive 10 or more minutes late for your appointment.  We strive to give you quality time with our providers, and arriving late affects you and other patients whose appointments are after yours.    Again, thank you for choosing [facility].  Our hope is that these requests will decrease the amount of time that you wait before being seen by our physicians.       _____________________________________________________________  Should you have questions after your visit to [facility], please contact our office at [phone] between the hours of 8:30 a.m. and 5:00 p.m.  Voicemails left after 4:30 p.m. will not be returned until the following business day.  For prescription refill requests, have your pharmacy contact our office with your prescription refill request.

## 2014-04-19 NOTE — Progress Notes (Signed)
Labs for b12,cbcd,mma

## 2014-04-20 LAB — VITAMIN B12: Vitamin B-12: 587 pg/mL (ref 211–911)

## 2014-04-21 LAB — METHYLMALONIC ACID, SERUM: METHYLMALONIC ACID, QUANTITATIVE: 144 nmol/L (ref 87–318)

## 2014-05-03 NOTE — Assessment & Plan Note (Signed)
Pleasant 64 year old male with pernicious anemia. I spent time today explaining pernicious anemia to him in detail. I advised him that he would need life long B12 replacement. I do not feel he has a need to see [place] more than once a year. He is very compliant with his medication. He has recently run out of his medication and I went ahead and refill him for 1 year. I advised him if he has problems prior to his follow-up in year to let us know. I will keep you advised of his laboratory studies and they are all available.

## 2014-05-05 ENCOUNTER — Telehealth: Payer: Self-pay | Admitting: Internal Medicine

## 2014-05-05 ENCOUNTER — Ambulatory Visit: Payer: Medicare HMO | Admitting: Nurse Practitioner

## 2014-05-05 NOTE — Telephone Encounter (Signed)
PATIENT CAME INTO OFFICE 05/05/14 FOR APPOINTMENT AND PRESENTED [employer] INSURANCE.  HIS PCP WAS CALLED AND TOLD us THAT THEY COULD NOT DO A REFERRAL FOR HIS VISIT WITH [place] DUE TO THE FACT THAT HE HAD NOT BEEN SEEN THERE IN 2 YEARS.  THE PATIENT WAS CANCELLED AND WILL SEE HIS PCP MARCH 2016 AND STATED HE WILL CALL us BACK TO RESCHEDULE WHEN HE HAS HIS OV AT [facility].

## 2014-05-05 NOTE — Telephone Encounter (Signed)
Per [place] with [facility] the patient needs to have recent contact with his pcp prior to referrals being made.  We were unable to catch this ahead of time, because we had not seen Frank Lin in a couple of years and his insurance had recently changed.

## 2014-06-01 ENCOUNTER — Other Ambulatory Visit (HOSPITAL_COMMUNITY): Payer: Self-pay | Admitting: Family Medicine

## 2014-06-01 ENCOUNTER — Ambulatory Visit (HOSPITAL_COMMUNITY)
Admission: RE | Admit: 2014-06-01 | Discharge: 2014-06-01 | Disposition: A | Discharge status: Home / Self Care | Payer: Commercial Managed Care - HMO | Source: Ambulatory Visit | Attending: Family Medicine | Admitting: Family Medicine

## 2014-06-01 DIAGNOSIS — H6062 Unspecified chronic otitis externa, left ear: Secondary | ICD-10-CM | POA: Diagnosis not present

## 2014-06-01 DIAGNOSIS — E663 Overweight: Secondary | ICD-10-CM | POA: Diagnosis not present

## 2014-06-01 DIAGNOSIS — Z6826 Body mass index (BMI) 26.0-26.9, adult: Secondary | ICD-10-CM | POA: Diagnosis not present

## 2014-06-01 DIAGNOSIS — Z0000 Encounter for general adult medical examination without abnormal findings: Secondary | ICD-10-CM | POA: Diagnosis not present

## 2014-06-01 DIAGNOSIS — R938 Abnormal findings on diagnostic imaging of other specified body structures: Secondary | ICD-10-CM | POA: Diagnosis not present

## 2014-06-01 DIAGNOSIS — Z1389 Encounter for screening for other disorder: Secondary | ICD-10-CM | POA: Diagnosis not present

## 2014-06-01 DIAGNOSIS — Z0001 Encounter for general adult medical examination with abnormal findings: Secondary | ICD-10-CM | POA: Diagnosis not present

## 2014-06-01 DIAGNOSIS — R635 Abnormal weight gain: Secondary | ICD-10-CM | POA: Diagnosis not present

## 2014-06-01 DIAGNOSIS — Z87891 Personal history of nicotine dependence: Secondary | ICD-10-CM | POA: Insufficient documentation

## 2014-06-01 DIAGNOSIS — K51411 Inflammatory polyps of colon with rectal bleeding: Secondary | ICD-10-CM | POA: Diagnosis not present

## 2014-06-01 DIAGNOSIS — R6889 Other general symptoms and signs: Secondary | ICD-10-CM | POA: Diagnosis not present

## 2014-06-01 DIAGNOSIS — J449 Chronic obstructive pulmonary disease, unspecified: Secondary | ICD-10-CM | POA: Diagnosis not present

## 2014-06-01 DIAGNOSIS — Z139 Encounter for screening, unspecified: Secondary | ICD-10-CM

## 2014-06-01 IMAGING — CR DG CHEST 2V
3 series · 3 of 3 positions shown · non-contrast
Comparison: [DATE]

CLINICAL DATA: Excess weight, former smoker, coronary disease

EXAM:
CHEST  2 VIEW

[view not recorded (1 of 3)]
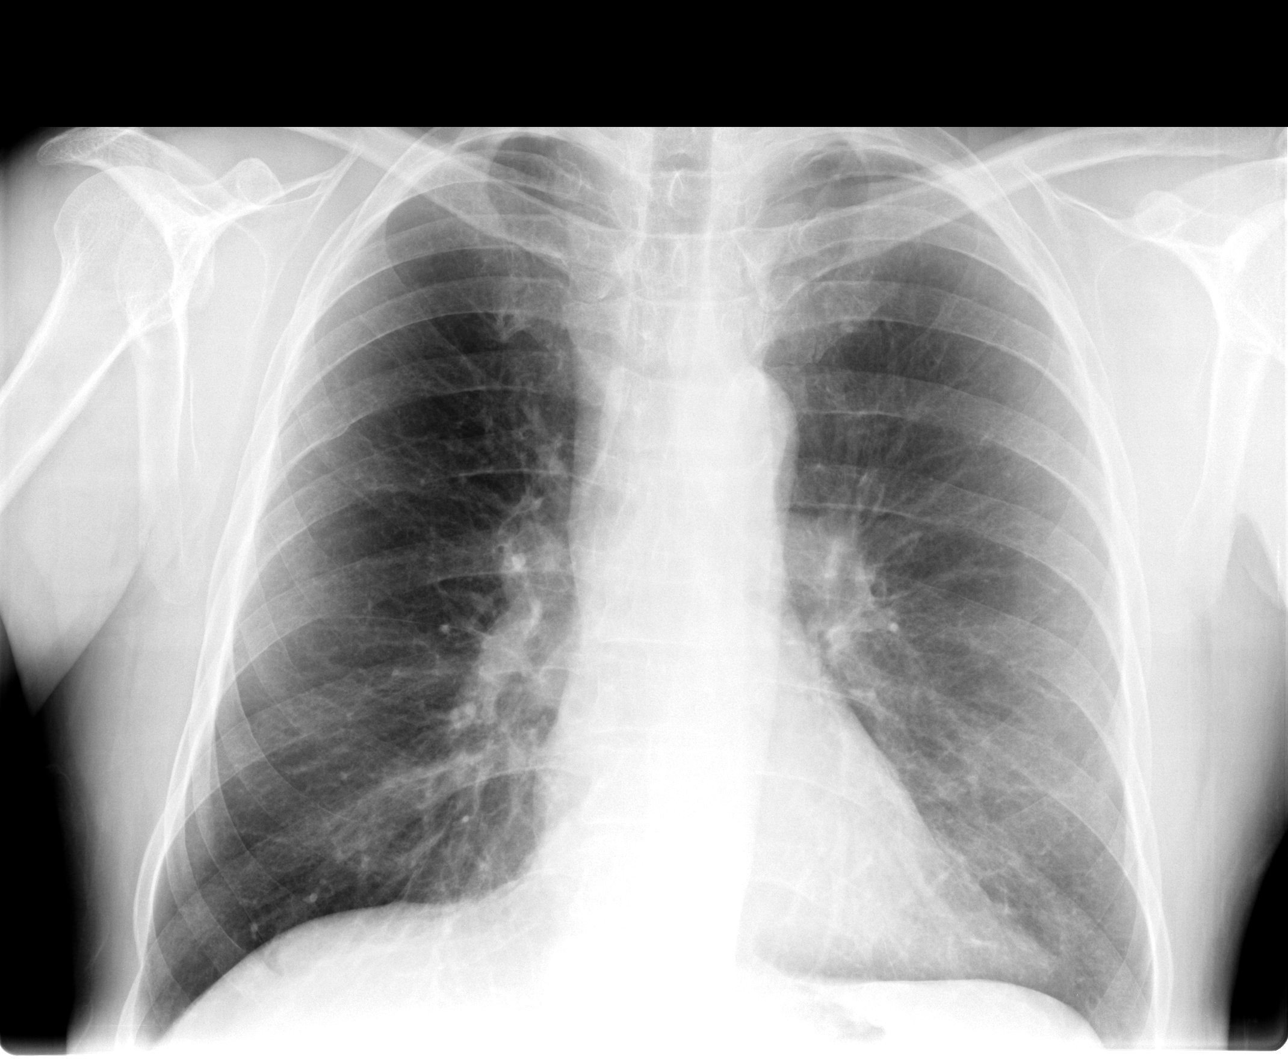

[view not recorded (2 of 3)]
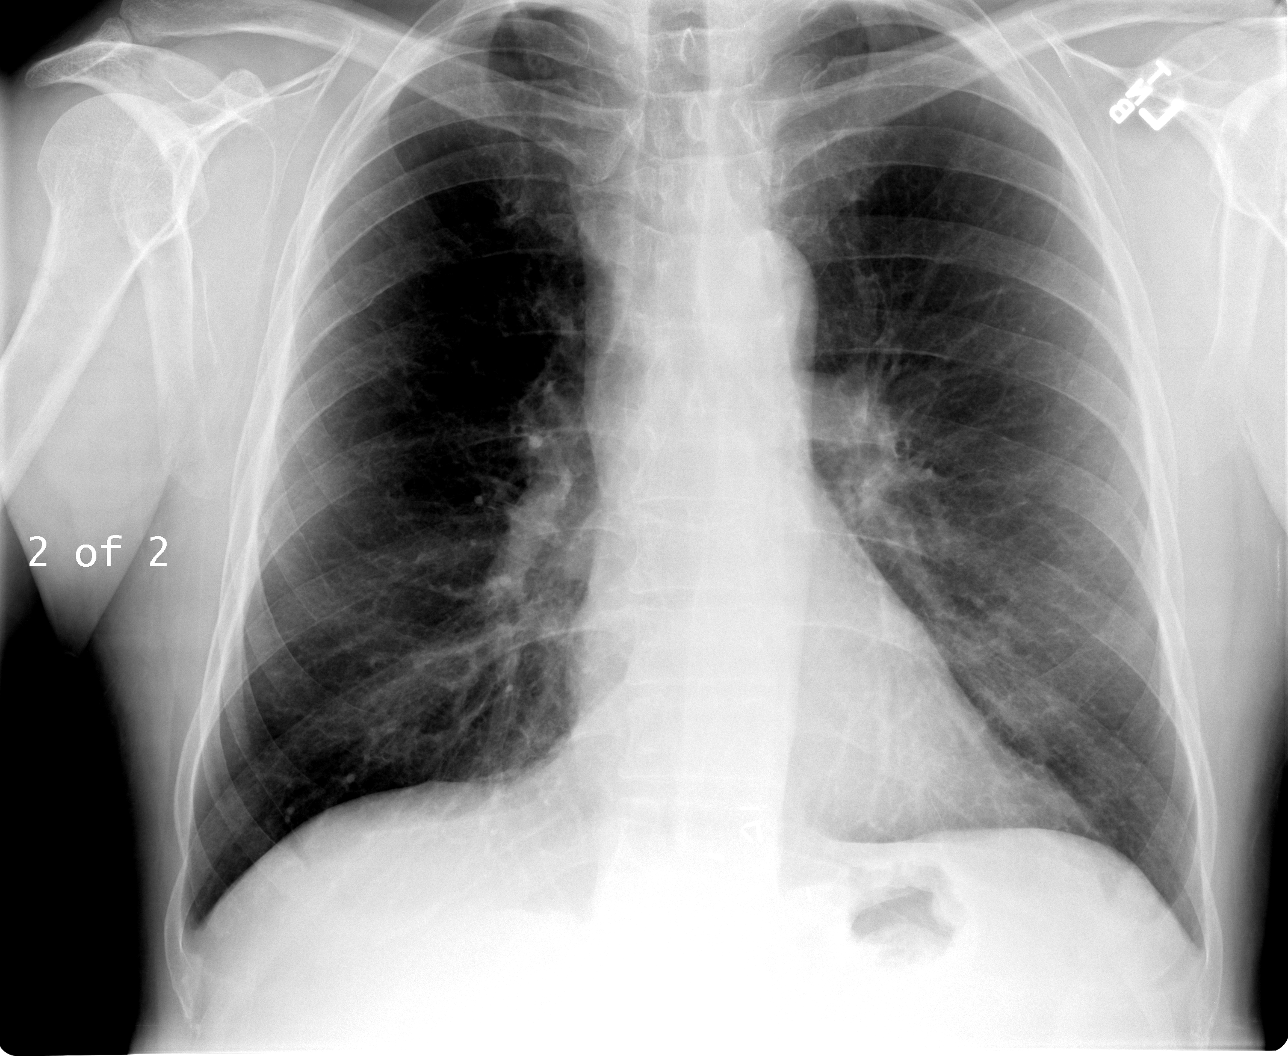

[view not recorded (3 of 3)]
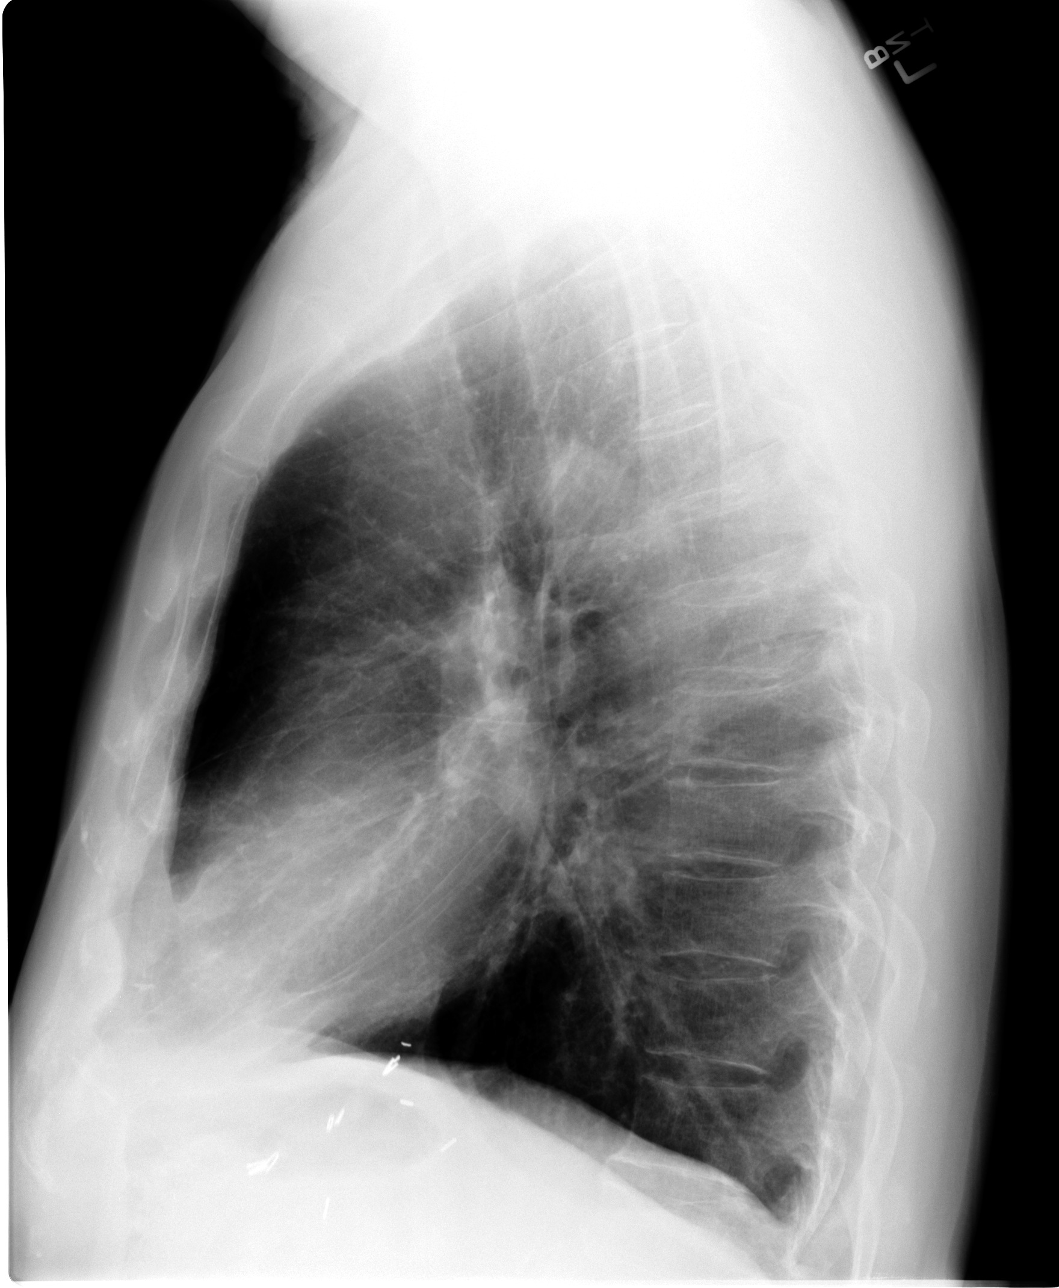

[3 of 3 positions shown; findings below may reference images not displayed]

FINDINGS: Normal heart size, mediastinal contours and pulmonary vascularity.

Bronchitic and minimal emphysematous changes.

Lungs otherwise clear.

No pleural effusion or pneumothorax.

Osseous structures unremarkable.

Questionable lung base nodule on previous exam no longer identified.

Surgical clips at GE junction region.
IMPRESSION: Bronchitic and minimal emphysematous changes suggesting COPD.

No acute abnormalities.

## 2014-06-07 ENCOUNTER — Ambulatory Visit (HOSPITAL_COMMUNITY)
Admission: RE | Admit: 2014-06-07 | Discharge: 2014-06-07 | Disposition: A | Discharge status: Home / Self Care | Payer: Commercial Managed Care - HMO | Source: Ambulatory Visit | Attending: Family Medicine | Admitting: Family Medicine

## 2014-06-07 DIAGNOSIS — Z1389 Encounter for screening for other disorder: Secondary | ICD-10-CM | POA: Insufficient documentation

## 2014-06-07 DIAGNOSIS — Z139 Encounter for screening, unspecified: Secondary | ICD-10-CM

## 2014-06-07 DIAGNOSIS — Z136 Encounter for screening for cardiovascular disorders: Secondary | ICD-10-CM | POA: Diagnosis not present

## 2014-06-07 IMAGING — US US AORTA SCREENING (MEDICARE)
1 series · 14 of 25 positions shown · non-contrast
Comparison: [DATE]

CLINICAL DATA: Medicare screening exam for abdominal aortic
aneurysm.

EXAM:
ABDOMINAL AORTA SCREENING ULTRASOUND
TECHNIQUE: Ultrasound examination of the abdominal aorta was performed as a
screening evaluation for abdominal aortic aneurysm.

[Series 1: us aorta screening (medicare) · 0.29mm/px · 14 of 31 slices shown]
[im 1/31]
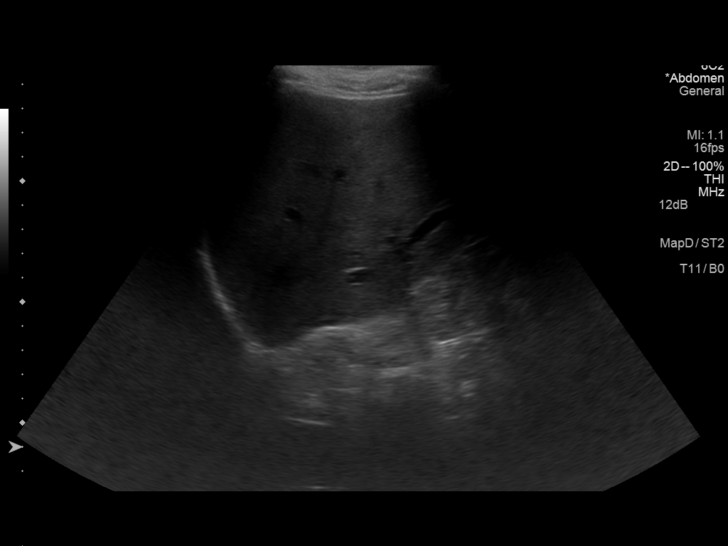
[im 3/31]
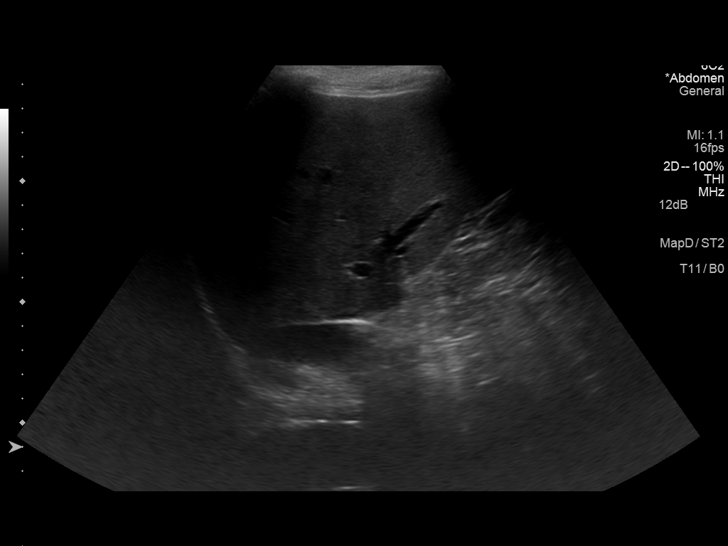
[im 6/31]
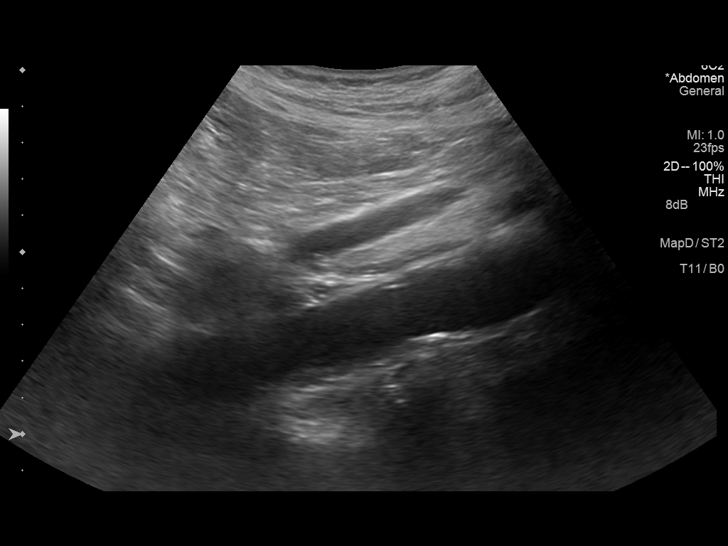
[im 8/31]
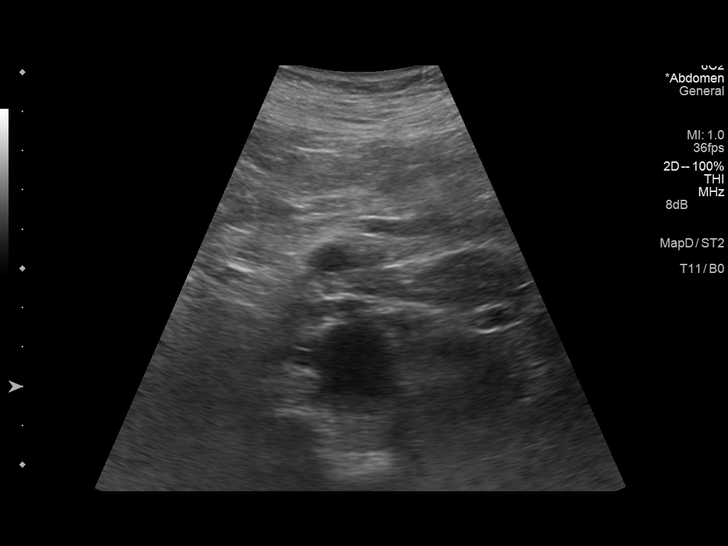
[im 11/31]
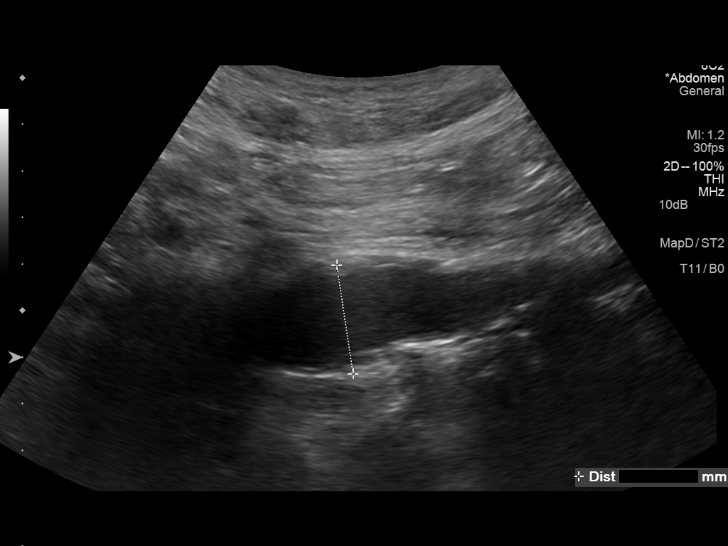
[im 12/31]
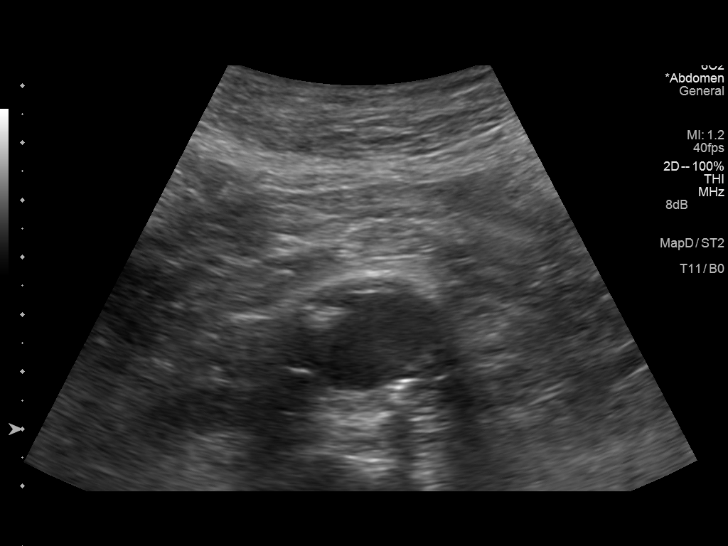
[im 14/31]
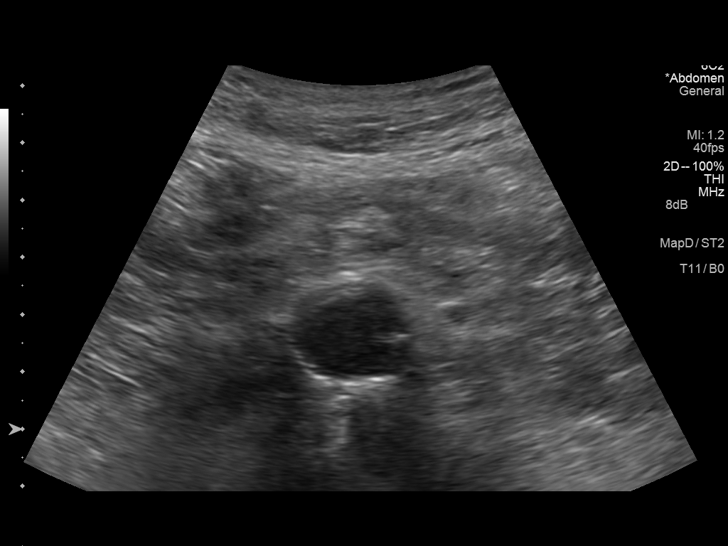
[im 17/31]
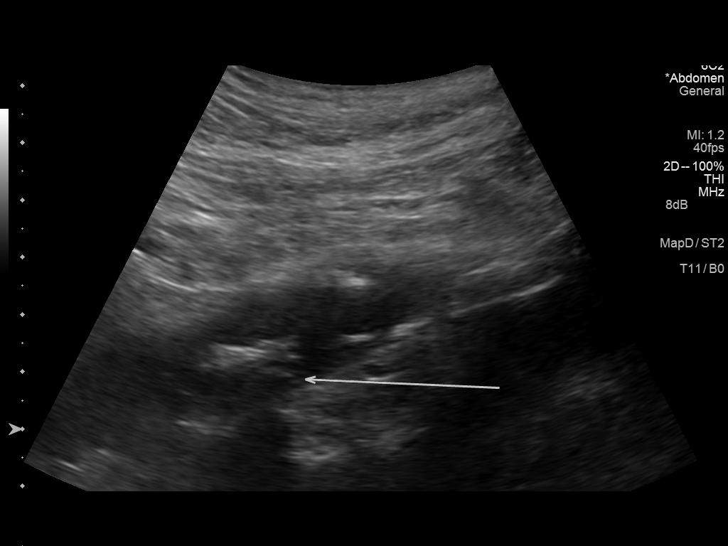
[im 19/31]
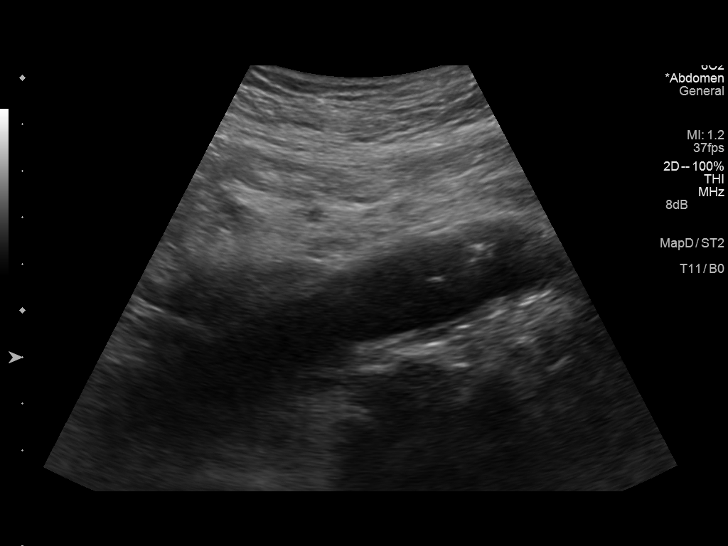
[im 21/31]
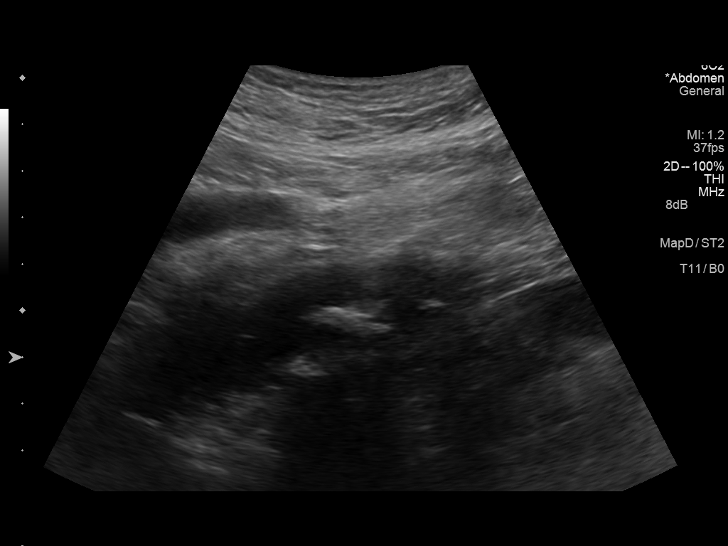
[im 23/31]
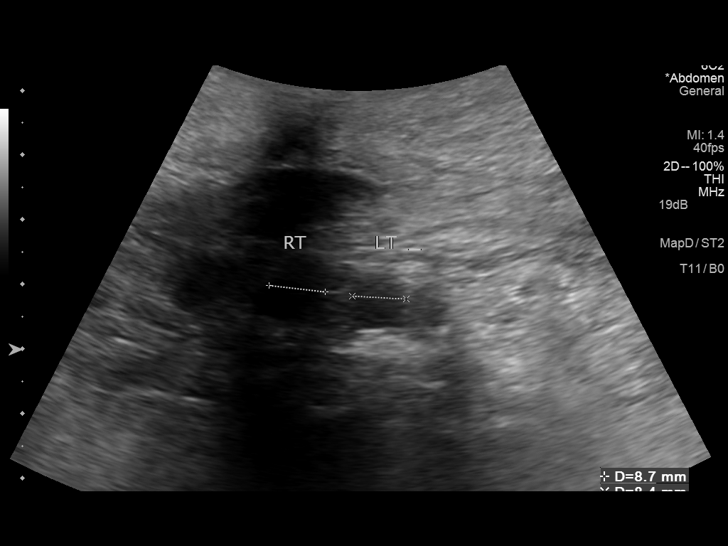
[im 26/31]
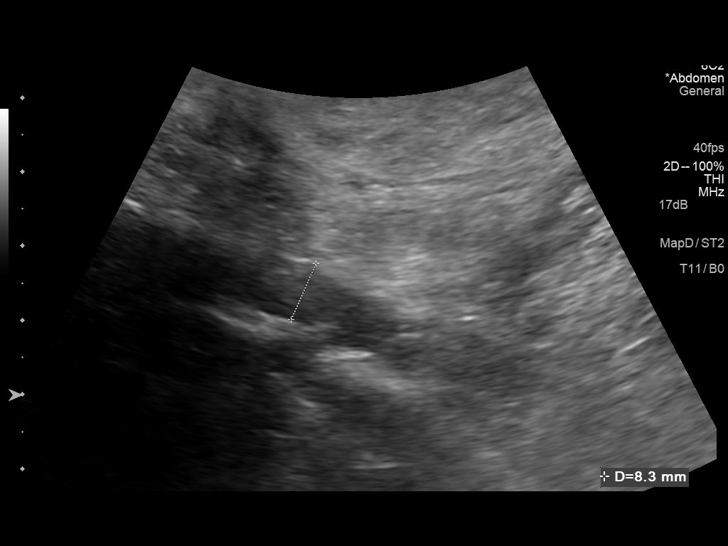
[im 28/31]
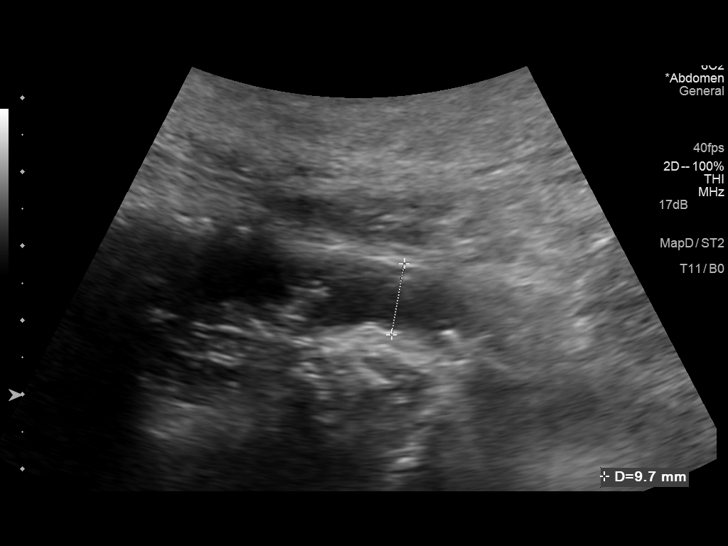
[im 31/31]
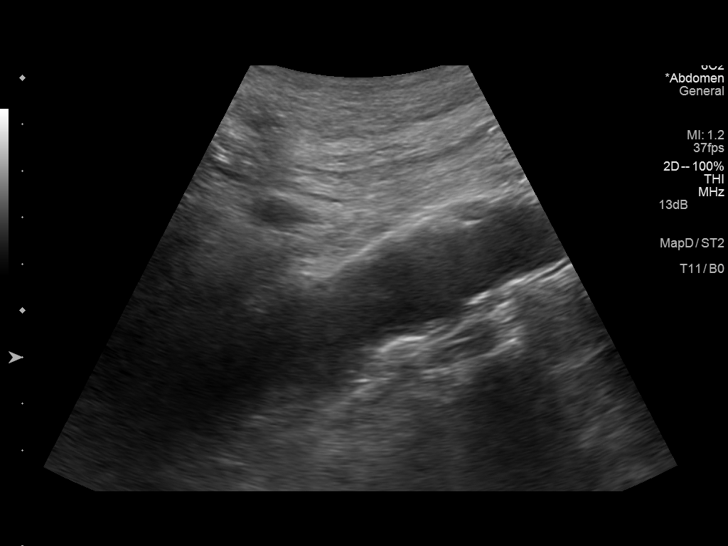

[14 of 25 positions shown; findings below may reference images not displayed]

FINDINGS: Abdominal Aorta

Abdominal aortic ectasia noted.

Maximum AP

Diameter:

Maximum TRV

Diameter:
IMPRESSION: Ectatic abdominal aorta at risk for aneurysm development. Recommend
followup by ultrasound in 5 years. This recommendation follows ACR
consensus guidelines: White Paper of the ACR Incidental Findings
Committee II on Vascular Findings. [HOSPITAL] [[facility]];

## 2014-10-04 DIAGNOSIS — E663 Overweight: Secondary | ICD-10-CM | POA: Diagnosis not present

## 2014-10-04 DIAGNOSIS — H6503 Acute serous otitis media, bilateral: Secondary | ICD-10-CM | POA: Diagnosis not present

## 2014-10-04 DIAGNOSIS — Z1389 Encounter for screening for other disorder: Secondary | ICD-10-CM | POA: Diagnosis not present

## 2014-10-04 DIAGNOSIS — Z6825 Body mass index (BMI) 25.0-25.9, adult: Secondary | ICD-10-CM | POA: Diagnosis not present

## 2014-10-04 DIAGNOSIS — H6693 Otitis media, unspecified, bilateral: Secondary | ICD-10-CM | POA: Diagnosis not present

## 2014-10-04 DIAGNOSIS — H6093 Unspecified otitis externa, bilateral: Secondary | ICD-10-CM | POA: Diagnosis not present

## 2014-11-03 DIAGNOSIS — D225 Melanocytic nevi of trunk: Secondary | ICD-10-CM | POA: Diagnosis not present

## 2014-11-03 DIAGNOSIS — L820 Inflamed seborrheic keratosis: Secondary | ICD-10-CM | POA: Diagnosis not present

## 2014-11-29 HISTORY — PX: ESOPHAGOGASTRODUODENOSCOPY: SHX1529

## 2015-04-19 ENCOUNTER — Ambulatory Visit (HOSPITAL_COMMUNITY): Payer: Self-pay | Admitting: Hematology & Oncology

## 2015-04-19 ENCOUNTER — Other Ambulatory Visit (HOSPITAL_COMMUNITY): Payer: Self-pay

## 2015-04-19 NOTE — Progress Notes (Signed)
This encounter was created in error - please disregard.

## 2015-05-19 ENCOUNTER — Other Ambulatory Visit (HOSPITAL_COMMUNITY): Payer: Self-pay | Admitting: Hematology & Oncology

## 2015-06-06 DIAGNOSIS — Z6825 Body mass index (BMI) 25.0-25.9, adult: Secondary | ICD-10-CM | POA: Diagnosis not present

## 2015-06-06 DIAGNOSIS — Z1389 Encounter for screening for other disorder: Secondary | ICD-10-CM | POA: Diagnosis not present

## 2015-06-06 DIAGNOSIS — E663 Overweight: Secondary | ICD-10-CM | POA: Diagnosis not present

## 2015-06-06 DIAGNOSIS — Z79899 Other long term (current) drug therapy: Secondary | ICD-10-CM | POA: Diagnosis not present

## 2015-06-06 DIAGNOSIS — Z0000 Encounter for general adult medical examination without abnormal findings: Secondary | ICD-10-CM | POA: Diagnosis not present

## 2015-06-23 DIAGNOSIS — H5200 Hypermetropia, unspecified eye: Secondary | ICD-10-CM | POA: Diagnosis not present

## 2015-06-23 DIAGNOSIS — Z0100 Encounter for examination of eyes and vision without abnormal findings: Secondary | ICD-10-CM | POA: Diagnosis not present

## 2015-06-23 DIAGNOSIS — H5210 Myopia, unspecified eye: Secondary | ICD-10-CM | POA: Diagnosis not present

## 2015-08-02 ENCOUNTER — Encounter (HOSPITAL_COMMUNITY): Payer: Commercial Managed Care - HMO

## 2015-08-02 ENCOUNTER — Encounter (HOSPITAL_COMMUNITY): Payer: Commercial Managed Care - HMO | Attending: Hematology & Oncology | Admitting: Hematology & Oncology

## 2015-08-02 VITALS — BP 139/88 | HR 89 | Temp 98.0°F | Resp 20 | Wt 168.6 lb

## 2015-08-02 DIAGNOSIS — E538 Deficiency of other specified B group vitamins: Secondary | ICD-10-CM | POA: Insufficient documentation

## 2015-08-02 DIAGNOSIS — K219 Gastro-esophageal reflux disease without esophagitis: Secondary | ICD-10-CM | POA: Insufficient documentation

## 2015-08-02 DIAGNOSIS — I2510 Atherosclerotic heart disease of native coronary artery without angina pectoris: Secondary | ICD-10-CM | POA: Insufficient documentation

## 2015-08-02 DIAGNOSIS — Z86010 Personal history of colonic polyps: Secondary | ICD-10-CM | POA: Insufficient documentation

## 2015-08-02 DIAGNOSIS — Z87891 Personal history of nicotine dependence: Secondary | ICD-10-CM | POA: Diagnosis not present

## 2015-08-02 DIAGNOSIS — Z9889 Other specified postprocedural states: Secondary | ICD-10-CM | POA: Diagnosis not present

## 2015-08-02 DIAGNOSIS — D61818 Other pancytopenia: Secondary | ICD-10-CM | POA: Diagnosis not present

## 2015-08-02 DIAGNOSIS — D510 Vitamin B12 deficiency anemia due to intrinsic factor deficiency: Secondary | ICD-10-CM

## 2015-08-02 LAB — CBC WITH DIFFERENTIAL/PLATELET
Basophils Absolute: 0 10*3/uL (ref 0.0–0.1)
Basophils Relative: 1 %
EOS PCT: 2 %
Eosinophils Absolute: 0.1 10*3/uL (ref 0.0–0.7)
HCT: 47.7 % (ref 39.0–52.0)
Hemoglobin: 17 g/dL (ref 13.0–17.0)
LYMPHS PCT: 49 %
Lymphs Abs: 3.4 10*3/uL (ref 0.7–4.0)
MCH: 30.4 pg (ref 26.0–34.0)
MCHC: 35.6 g/dL (ref 30.0–36.0)
MCV: 85.3 fL (ref 78.0–100.0)
MONOS PCT: 6 %
Monocytes Absolute: 0.4 10*3/uL (ref 0.1–1.0)
NEUTROS ABS: 2.8 10*3/uL (ref 1.7–7.7)
Neutrophils Relative %: 42 %
PLATELETS: 150 10*3/uL (ref 150–400)
RBC: 5.59 MIL/uL (ref 4.22–5.81)
RDW: 13.8 % (ref 11.5–15.5)
WBC: 6.8 10*3/uL (ref 4.0–10.5)

## 2015-08-02 LAB — VITAMIN B12: Vitamin B-12: 296 pg/mL (ref 180–914)

## 2015-08-02 LAB — FOLATE: Folate: 9.7 ng/mL (ref >5.9)

## 2015-08-02 NOTE — Patient Instructions (Addendum)
[place] at [facility] Discharge Instructions  RECOMMENDATIONS MADE BY THE CONSULTANT AND ANY TEST RESULTS WILL BE SENT TO YOUR REFERRING PHYSICIAN.  B12 called into [facility] with 11 refills  Return to clinic in 1 year with labs due (CBC diff/B12/Folate)  Go to lab today!   Thank you for choosing [place] at [facility] to provide your oncology and hematology care.  To afford each patient quality time with our provider, please arrive at least 15 minutes before your scheduled appointment time.   Beginning January 23rd 2017 lab work for the [facility] will be done in the  Main lab at [facility] on 1st floor. If you have a lab appointment with the [facility] please come in thru the  Main Entrance and check in at the main information desk  You need to re-schedule your appointment should you arrive 10 or more minutes late.  We strive to give you quality time with our providers, and arriving late affects you and other patients whose appointments are after yours.  Also, if you no show three or more times for appointments you may be dismissed from the clinic at the providers discretion.     Again, thank you for choosing [facility].  Our hope is that these requests will decrease the amount of time that you wait before being seen by our physicians.       _____________________________________________________________  Should you have questions after your visit to [facility], please contact our office at [phone] between the hours of 8:30 a.m. and 4:30 p.m.  Voicemails left after 4:30 p.m. will not be returned until the following business day.  For prescription refill requests, have your pharmacy contact our office.         Resources For Cancer Patients and their Caregivers ? American Cancer Society: Can assist with transportation, wigs, general needs, runs Look Good Feel Better.         514-849-2285 ? Cancer Care: Provides financial assistance, online support groups, medication/co-pay assistance.  1-800-813-HOPE 270-641-0874) ? [facility] Assists [place] cancer patients and their families through emotional , educational and financial support.  260-146-2974 ? [place] DSS Where to apply for food stamps, Medicaid and utility assistance. 639-758-1094 ? [facility]: Transportation to medical appointments. (385)433-9494 ? Social Security Administration: May apply for disability if have a Stage IV cancer. 973-855-7728 616 017 1369 ? [employer], Disability and Transit Services: Assists with nutrition, care and transit needs. [facility] Support Programs: @10RELATIVEDAYS @ > Cancer Support Group  2nd Tuesday of the month 1pm-2pm, Journey Room  > Creative Journey  3rd Tuesday of the month 1130am-1pm, Journey Room  > Look Good Feel Better  1st Wednesday of the month 10am-12 noon, Journey Room (Call [facility] to register [phone])

## 2015-08-02 NOTE — Progress Notes (Signed)
[facility] at [place], [place] [id number]    DIAGNOSIS: Pernicious anemia on B12 IM, patient self administers.   CURRENT THERAPY: B12 IM  INTERVAL HISTORY: Frank Lin 65 y.o. male returns for follow-up of his pernicious anemia.  He had his last B12 injection in February.  He denies excessive fatigue, no numbness or tingling in the hands or feet and no difficulty with memory.  He denies any problems with his B12 injections. He works at [employer].   Frank Lin returns to the [facility] today unaccompanied.  When asked if he's behaving, he says "some days." in terms of his appetite, he says he's "not really" eating okay. He lives alone and is widowed. He notes that this gives him the chance sometimes to get lazy. He feels like he doesn't want to fix anything sometimes; doesn't want to get into a big meal because of leftovers; doesn't want to cook. He says "I don't eat good; I know I should, but I don't."  He says work isn't really keeping him busy; he has time to nap and just relax.  He hasn't done his B12 in 60 days because he's missed his appointments, and had issues with a referral.   Other than that he's been doing his B12 regularly on a monthly basis.  His PCP is Software engineer. He confirms that he's had a colonoscopy and surveillance on what's left of his colon. Last colonoscopy was in 2013.   MEDICAL HISTORY: Past Medical History  Diagnosis Date  . Peptic ulcer 1971  . CAD in native artery   . Pancytopenia 11/28/2011  . B12 deficiency 11/29/2011  . Anemia 11/28/2011  . Colon polyps 11/30/2011  . Leukopenia 11/30/2011  . Unintentional weight loss 11/28/2011  . GERD (gastroesophageal reflux disease)     has Unintentional weight loss; Heme positive stool; Tobacco use disorder; PUD (peptic ulcer disease); B12 deficiency; and Colon polyps on his problem list.     has No Known  Allergies.  Frank Lin had no medications administered during this visit.  SURGICAL HISTORY: Past Surgical History  Procedure Laterality Date  . Appendectomy  1970  . Cardiac catheterization  1996    s/p PTCA/stent (Dr Lia Foyer)  . Stomach surgery  1971    Hx PUD   . Septoplasty    . Colonoscopy  12/30/2011    RMR: Multiple colonic polyps-status post multiple snare polypectomies and polyp debulking. 1 small rectal polyp removed as described above . Sigmoid and rectum looked good endoscopically.   . Colon resection  01/15/2012    Procedure: HAND ASSISTED LAPAROSCOPIC COLON RESECTION;  Surgeon: Jamesetta So, MD;  Location: [facility];  Service: General;  Laterality: N/A;  partial colectomy  . Partial colectomy  01/15/2012    Procedure: PARTIAL COLECTOMY;  Surgeon: Jamesetta So, MD;  Location: [facility];  Service: General;  Laterality: N/A;  . Esophagogastroduodenoscopy  11/29/2014    RMR: Normal esophagus. Status post prior gastric surgery as described above. Abnoraml proximal small bowel status post biopsy. Status psot gastric biopsy.     SOCIAL HISTORY: Social History   Social History  . Marital Status: Widowed    Spouse Name: N/A  . Number of Children: 2  . 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 Smoking status: Former Smoker -- 0.50 packs/day for  45 years    Types: Cigarettes    Quit date: 11/27/1996  . Smokeless tobacco: Never Used  . Alcohol Use: No  . Drug Use: No  . Sexual Activity: Yes   Other Topics Concern  . Not on file   Social History Narrative    FAMILY HISTORY: Family History  Problem Relation Age of Onset  . Heart failure Brother     Review of Systems  Constitutional: Negative for fever, chills, weight loss and malaise/fatigue.  HENT: Negative for congestion, hearing loss, nosebleeds, sore throat and tinnitus.   Eyes: Negative for blurred vision, double vision, pain and  discharge.  Respiratory: Negative for cough, hemoptysis, sputum production, shortness of breath and wheezing.   Cardiovascular: Negative for chest pain, palpitations, claudication, leg swelling and PND.  Gastrointestinal: Negative for heartburn, nausea, vomiting, abdominal pain, diarrhea, constipation, blood in stool and melena.  Genitourinary: Negative for dysuria, urgency, frequency and hematuria.  Musculoskeletal: Negative for myalgias, joint pain and falls.  Skin: Negative for itching and rash.  Neurological: Negative for dizziness, tingling, tremors, sensory change, speech change, focal weakness, seizures, loss of consciousness, weakness and headaches.  Endo/Heme/Allergies: Does not bruise/bleed easily.  Psychiatric/Behavioral: Negative for depression, suicidal ideas, memory loss and substance abuse. The patient is not nervous/anxious and does not have insomnia.   14 point review of systems was performed and is negative except as detailed under history of present illness and above    PHYSICAL EXAMINATION  ECOG PERFORMANCE STATUS: 0 - Asymptomatic  Filed Vitals:   08/02/15 1053  BP: 139/88  Pulse: 89  Temp: 98 F (36.7 C)  Resp: 20    Physical Exam  Constitutional: He is oriented to person, place, and time and well-developed, well-nourished, and in no distress.  HENT:  Head: Normocephalic and atraumatic.  Nose: Nose normal.  Mouth/Throat: Oropharynx is clear and moist. No oropharyngeal exudate.  Eyes: Conjunctivae and EOM are normal. Pupils are equal, round, and reactive to light. Right eye exhibits no discharge. Left eye exhibits no discharge. No scleral icterus.  Neck: Normal range of motion. Neck supple. No tracheal deviation present. No thyromegaly present.  Cardiovascular: Normal rate, regular rhythm and normal heart sounds.  Exam reveals no gallop and no friction rub.   No murmur heard. Pulmonary/Chest: Effort normal and breath sounds normal. He has no wheezes. He has  no rales.  Abdominal: Soft. Bowel sounds are normal. He exhibits no distension and no mass. There is no tenderness. There is no rebound and no guarding.  Musculoskeletal: Normal range of motion. He exhibits no edema.  Lymphadenopathy:    He has no cervical adenopathy.  Neurological: He is alert and oriented to person, place, and time. He has normal reflexes. No cranial nerve deficit. Gait normal. Coordination normal.  Skin: Skin is warm and dry. No rash noted.  Psychiatric: Mood, memory, affect and judgment normal.  Nursing note and vitals reviewed.   LABORATORY DATA: I have reviewed the data as listed.  CBC    Component Value Date/Time   WBC 7.3 04/19/2014 1301   RBC 5.39 04/19/2014 1301   RBC 5.36 05/04/2012 0817   HGB 16.0 04/19/2014 1301   HCT 46.0 04/19/2014 1301   HCT 15 11/19/2011 0827   PLT 175 04/19/2014 1301   MCV 85.3 04/19/2014 1301   MCH 29.7 04/19/2014 1301   MCHC 34.8 04/19/2014 1301   RDW 14.4 04/19/2014 1301   LYMPHSABS 3.2 04/19/2014 1301   MONOABS 0.5 04/19/2014 1301   EOSABS 0.1  04/19/2014 1301   BASOSABS 0.0 04/19/2014 1301   CMP     Component Value Date/Time   NA 137 01/17/2012 0448   K 4.1 01/17/2012 0448   CL 103 01/17/2012 0448   CO2 26 01/17/2012 0448   GLUCOSE 111* 01/17/2012 0448   BUN 5* 01/17/2012 0448   CREATININE 0.70 01/17/2012 0448   CREATININE 0.71 11/28/2011 0837   CALCIUM 8.6 01/17/2012 0448   PROT 6.2 12/03/2011 1047   ALBUMIN 3.4* 12/03/2011 1047   AST 16 12/03/2011 1047   ALT 9 12/03/2011 1047   ALKPHOS 85 12/03/2011 1047   BILITOT 1.1 12/03/2011 1047   GFRNONAA >90 01/17/2012 0448   GFRAA >90 01/17/2012 0448      ASSESSMENT and THERAPY PLAN:  Pernicious Anemia B12 deficiency   We will check his counts today before he leaves, to make sure everything is okay.  Orders Placed This Encounter  Procedures  . Folate    Standing Status: Future     Number of Occurrences: 1     Standing Expiration Date: 08/01/2016  .  CBC with Differential    Standing Status: Future     Number of Occurrences:      Standing Expiration Date: 08/01/2017  . Folate    Standing Status: Future     Number of Occurrences:      Standing Expiration Date: 08/01/2017  . Vitamin B12    Standing Status: Future     Number of Occurrences:      Standing Expiration Date: 08/01/2017   I will call in his B12 to [facility], with 11 refills. He was advised to let me know if he runs of B12 prior to follow up. I will be glad to provide short term refills until we see him each year. I do feel it is important for ongoing yearly observation.  No problem-specific assessment & plan notes found for this encounter. All questions were answered. The patient knows to call the clinic with any problems, questions or concerns. We can certainly see the patient much sooner if necessary.  This document serves as a record of services personally performed by Ancil Linsey, MD. It was created on her behalf by Toni Amend, a trained medical scribe. The creation of this record is based on the scribe's personal observations and the provider's statements to them. This document has been checked and approved by the attending provider.  I have reviewed the above documentation for accuracy and completeness and I agree with the above.  Frank Hazard, MD  08/02/2015

## 2015-08-02 NOTE — Progress Notes (Signed)
Additional refills added to current b12 rx at [facility]. He already had 5 refills and I added 7 to make it a year supply.

## 2015-08-03 ENCOUNTER — Encounter (HOSPITAL_COMMUNITY): Payer: Self-pay | Admitting: Hematology & Oncology

## 2015-08-03 DIAGNOSIS — D510 Vitamin B12 deficiency anemia due to intrinsic factor deficiency: Secondary | ICD-10-CM | POA: Insufficient documentation

## 2016-08-02 ENCOUNTER — Encounter (HOSPITAL_COMMUNITY): Payer: Medicare HMO | Attending: Oncology | Admitting: Oncology

## 2016-08-02 ENCOUNTER — Encounter (HOSPITAL_COMMUNITY): Payer: Medicare HMO

## 2016-08-02 ENCOUNTER — Encounter (HOSPITAL_COMMUNITY): Payer: Self-pay

## 2016-08-02 VITALS — BP 160/97 | HR 92 | Temp 98.1°F | Resp 20 | Wt 171.6 lb

## 2016-08-02 DIAGNOSIS — E538 Deficiency of other specified B group vitamins: Secondary | ICD-10-CM | POA: Diagnosis not present

## 2016-08-02 DIAGNOSIS — D510 Vitamin B12 deficiency anemia due to intrinsic factor deficiency: Secondary | ICD-10-CM

## 2016-08-02 LAB — CBC WITH DIFFERENTIAL/PLATELET
Basophils Absolute: 0 10*3/uL (ref 0.0–0.1)
Basophils Relative: 1 %
Eosinophils Absolute: 0.1 10*3/uL (ref 0.0–0.7)
Eosinophils Relative: 1 %
HCT: 47.7 % (ref 39.0–52.0)
Hemoglobin: 17.1 g/dL — ABNORMAL HIGH (ref 13.0–17.0)
LYMPHS ABS: 3 10*3/uL (ref 0.7–4.0)
LYMPHS PCT: 39 %
MCH: 30.4 pg (ref 26.0–34.0)
MCHC: 35.8 g/dL (ref 30.0–36.0)
MCV: 84.7 fL (ref 78.0–100.0)
Monocytes Absolute: 0.5 10*3/uL (ref 0.1–1.0)
Monocytes Relative: 7 %
Neutro Abs: 4 10*3/uL (ref 1.7–7.7)
Neutrophils Relative %: 52 %
PLATELETS: 154 10*3/uL (ref 150–400)
RBC: 5.63 MIL/uL (ref 4.22–5.81)
RDW: 13.9 % (ref 11.5–15.5)
WBC: 7.7 10*3/uL (ref 4.0–10.5)

## 2016-08-02 LAB — FOLATE: Folate: 8.4 ng/mL (ref >5.9)

## 2016-08-02 LAB — VITAMIN B12: Vitamin B-12: 483 pg/mL (ref 180–914)

## 2016-08-02 MED ORDER — CYANOCOBALAMIN 1000 MCG/ML IJ SOLN: INTRAMUSCULAR | 13 refills | Status: DC

## 2016-08-02 NOTE — Patient Instructions (Addendum)
[place] at [facility] Discharge Instructions  RECOMMENDATIONS MADE BY THE CONSULTANT AND ANY TEST RESULTS WILL BE SENT TO YOUR REFERRING PHYSICIAN.  You were seen today by Dr. Twana First Follow up in 1 year with lab work   Thank you for choosing [place] at [facility] to provide your oncology and hematology care.  To afford each patient quality time with our provider, please arrive at least 15 minutes before your scheduled appointment time.    If you have a lab appointment with the [facility] please come in thru the  Main Entrance and check in at the main information desk  You need to re-schedule your appointment should you arrive 10 or more minutes late.  We strive to give you quality time with our providers, and arriving late affects you and other patients whose appointments are after yours.  Also, if you no show three or more times for appointments you may be dismissed from the clinic at the providers discretion.     Again, thank you for choosing [facility].  Our hope is that these requests will decrease the amount of time that you wait before being seen by our physicians.       _____________________________________________________________  Should you have questions after your visit to [facility], please contact our office at [phone] between the hours of 8:30 a.m. and 4:30 p.m.  Voicemails left after 4:30 p.m. will not be returned until the following business day.  For prescription refill requests, have your pharmacy contact our office.       Resources For Cancer Patients and their Caregivers ? American Cancer Society: Can assist with transportation, wigs, general needs, runs Look Good Feel Better.        ([phone] 713-0323 ? Cancer Care: Provides financial assistance, online support groups, medication/co-pay assistance.  1-800-813-HOPE 587-433-0741) ? [facility] Assists  [place] cancer patients and their families through emotional , educational and financial support.  956-304-5129 ? [place] DSS Where to apply for food stamps, Medicaid and utility assistance. 903-137-8218 ? [facility]: Transportation to medical appointments. 437-344-1456 ? Social Security Administration: May apply for disability if have a Stage IV cancer. 551-369-9767 206-429-2581 ? [employer], Disability and Transit Services: Assists with nutrition, care and transit needs. [place] Support Programs: @10RELATIVEDAYS @ > Cancer Support Group  2nd Tuesday of the month 1pm-2pm, Journey Room  > Creative Journey  3rd Tuesday of the month 1130am-1pm, Journey Room  > Look Good Feel Better  1st Wednesday of the month 10am-12 noon, Journey Room (Call [place] to register [phone])

## 2016-08-02 NOTE — Progress Notes (Signed)
[place] at [place], [place] [id number]    DIAGNOSIS: Pernicious anemia on B12 IM, patient self administers.   CURRENT THERAPY: B12 IM  INTERVAL HISTORY: Frank Lin 66 y.o. male returns for follow-up of his pernicious anemia.    He has been doing well overall. He continues to take B12 shots without issue. He sees his PCP annually. He has been eating well. Denies shortness of breath, chest pain, or abdominal pain. He has no concerns or complaints at this time. He is up to date on his colonoscopies.    MEDICAL HISTORY: Past Medical History:  Diagnosis Date  . Anemia 11/28/2011  . B12 deficiency 11/29/2011  . CAD in native artery   . Colon polyps 11/30/2011  . GERD (gastroesophageal reflux disease)   . Leukopenia 11/30/2011  . Pancytopenia ([place]) 11/28/2011  . Peptic ulcer 1971  . Unintentional weight loss 11/28/2011    has Unintentional weight loss; Heme positive stool; Tobacco use disorder; PUD (peptic ulcer disease); B12 deficiency; Colon polyps; and Pernicious anemia on his problem list.     has No Known Allergies.   SURGICAL HISTORY: Past Surgical History:  Procedure Laterality Date  . APPENDECTOMY  1970  . CARDIAC CATHETERIZATION  1996   s/p PTCA/stent (Dr Lia Foyer)  . COLON RESECTION  01/15/2012   Procedure: HAND ASSISTED LAPAROSCOPIC COLON RESECTION;  Surgeon: Jamesetta So, MD;  Location: [facility];  Service: General;  Laterality: N/A;  partial colectomy  . COLONOSCOPY  12/30/2011   RMR: Multiple colonic polyps-status post multiple snare polypectomies and polyp debulking. 1 small rectal polyp removed as described above . Sigmoid and rectum looked good endoscopically.   . ESOPHAGOGASTRODUODENOSCOPY  11/29/2014   RMR: Normal esophagus. Status post prior gastric surgery as described above. Abnoraml proximal small bowel status post biopsy. Status psot gastric biopsy.   Marland Kitchen PARTIAL COLECTOMY   01/15/2012   Procedure: PARTIAL COLECTOMY;  Surgeon: Jamesetta So, MD;  Location: [facility];  Service: General;  Laterality: N/A;  . SEPTOPLASTY    . STOMACH SURGERY  1971   Hx PUD     SOCIAL HISTORY: Social History   Social History  . Marital status: Widowed    Spouse name: N/A  . Number of children: 2  . Years of education: N/A   Occupational History  . [employer]   Social History Main Topics  . Smoking status: Former Smoker    Packs/day: 0.50    Years: 45.00    Types: Cigarettes    Quit date: 11/27/1996  . Smokeless tobacco: Never Used  . Alcohol use No  . Drug use: No  . Sexual activity: Yes   Other Topics Concern  . Not on file   Social History Narrative  . No narrative on file    FAMILY HISTORY: Family History  Problem Relation Age of Onset  . Heart failure Brother     Review of Systems  Constitutional: Negative.   HENT: Negative.   Eyes: Negative.   Respiratory: Negative.   Cardiovascular: Negative.   Gastrointestinal: Negative.   Genitourinary: Negative.   Musculoskeletal: Negative.   Skin: Negative.   Neurological: Negative.   Endo/Heme/Allergies: Negative.   Psychiatric/Behavioral: Negative.   All other systems reviewed and are negative.   PHYSICAL EXAMINATION  ECOG PERFORMANCE STATUS: 0 - Asymptomatic  Vitals:   08/02/16 1151  BP: (!) 160/97  Pulse: 92  Resp:  20  Temp: 98.1 F (36.7 C)    Physical Exam  Constitutional: He is oriented to person, place, and time and well-developed, well-nourished, and in no distress.  HENT:  Head: Normocephalic and atraumatic.  Mouth/Throat: Oropharynx is clear and moist.  Eyes: Conjunctivae and EOM are normal. Pupils are equal, round, and reactive to light.  Neck: Normal range of motion. Neck supple.  Cardiovascular: Normal rate, regular rhythm and normal heart sounds.   Pulmonary/Chest: Effort normal and breath sounds normal.  Abdominal: Soft. Bowel  sounds are normal.  Musculoskeletal: Normal range of motion.  Neurological: He is alert and oriented to person, place, and time. Gait normal.  Skin: Skin is warm and dry.  Nursing note and vitals reviewed.   LABORATORY DATA: I have reviewed the data as listed.  CBC    Component Value Date/Time   WBC 7.7 08/02/2016 1111   RBC 5.63 08/02/2016 1111   HGB 17.1 (H) 08/02/2016 1111   HCT 47.7 08/02/2016 1111   HCT 15 11/19/2011 0827   PLT 154 08/02/2016 1111   MCV 84.7 08/02/2016 1111   MCH 30.4 08/02/2016 1111   MCHC 35.8 08/02/2016 1111   RDW 13.9 08/02/2016 1111   LYMPHSABS 3.0 08/02/2016 1111   MONOABS 0.5 08/02/2016 1111   EOSABS 0.1 08/02/2016 1111   BASOSABS 0.0 08/02/2016 1111   CMP     Component Value Date/Time   NA 137 01/17/2012 0448   K 4.1 01/17/2012 0448   CL 103 01/17/2012 0448   CO2 26 01/17/2012 0448   GLUCOSE 111 (H) 01/17/2012 0448   BUN 5 (L) 01/17/2012 0448   CREATININE 0.70 01/17/2012 0448   CREATININE 0.71 11/28/2011 0837   CALCIUM 8.6 01/17/2012 0448   PROT 6.2 12/03/2011 1047   ALBUMIN 3.4 (L) 12/03/2011 1047   AST 16 12/03/2011 1047   ALT 9 12/03/2011 1047   ALKPHOS 85 12/03/2011 1047   BILITOT 1.1 12/03/2011 1047   GFRNONAA >90 01/17/2012 0448   GFRAA >90 01/17/2012 0448      ASSESSMENT and THERAPY PLAN:  Pernicious Anemia B12 deficiency  The patient is doing well overall. He continues on B12 shots without issue. Labs today show HGB 17.1. B12 level pending.  I refilled his B12 today.  He sees his PCP annually.  He will return for follow up and repeat labs in 1 year.    Orders Placed This Encounter  Procedures  . CBC with Differential    Standing Status:   Future    Standing Expiration Date:   02/02/2018  . Comprehensive metabolic panel    Standing Status:   Future    Standing Expiration Date:   02/02/2018  . Vitamin B12    Standing Status:   Future    Standing Expiration Date:   02/02/2018     No problem-specific  Assessment & Plan notes found for this encounter. All questions were answered. The patient knows to call the clinic with any problems, questions or concerns. We can certainly see the patient much sooner if necessary.  This document serves as a record of services personally performed by Twana First, MD. It was created on her behalf by Arlyce Harman, a trained medical scribe. The creation of this record is based on the scribe's personal observations and the provider's statements to them. This document has been checked and approved by the attending provider.  I have reviewed the above documentation for accuracy and completeness and I agree with the above.  Carlis Abbott  08/02/2016     

## 2017-03-05 DIAGNOSIS — E663 Overweight: Secondary | ICD-10-CM | POA: Diagnosis not present

## 2017-03-05 DIAGNOSIS — Z23 Encounter for immunization: Secondary | ICD-10-CM | POA: Diagnosis not present

## 2017-03-05 DIAGNOSIS — Z1389 Encounter for screening for other disorder: Secondary | ICD-10-CM | POA: Diagnosis not present

## 2017-03-05 DIAGNOSIS — Z6826 Body mass index (BMI) 26.0-26.9, adult: Secondary | ICD-10-CM | POA: Diagnosis not present

## 2017-03-05 DIAGNOSIS — Z79899 Other long term (current) drug therapy: Secondary | ICD-10-CM | POA: Diagnosis not present

## 2017-03-05 DIAGNOSIS — Z0001 Encounter for general adult medical examination with abnormal findings: Secondary | ICD-10-CM | POA: Diagnosis not present

## 2017-03-05 DIAGNOSIS — K51411 Inflammatory polyps of colon with rectal bleeding: Secondary | ICD-10-CM | POA: Diagnosis not present

## 2017-03-05 DIAGNOSIS — K639 Disease of intestine, unspecified: Secondary | ICD-10-CM | POA: Diagnosis not present

## 2017-03-14 ENCOUNTER — Other Ambulatory Visit (HOSPITAL_COMMUNITY): Payer: Self-pay | Admitting: Family Medicine

## 2017-03-14 DIAGNOSIS — F17200 Nicotine dependence, unspecified, uncomplicated: Secondary | ICD-10-CM

## 2017-03-21 DIAGNOSIS — H5200 Hypermetropia, unspecified eye: Secondary | ICD-10-CM | POA: Diagnosis not present

## 2017-03-21 DIAGNOSIS — Z0100 Encounter for examination of eyes and vision without abnormal findings: Secondary | ICD-10-CM | POA: Diagnosis not present

## 2017-03-26 ENCOUNTER — Ambulatory Visit (HOSPITAL_COMMUNITY)
Admission: RE | Admit: 2017-03-26 | Discharge: 2017-03-26 | Disposition: A | Discharge status: Home / Self Care | Payer: Medicare HMO | Source: Ambulatory Visit | Attending: Family Medicine | Admitting: Family Medicine

## 2017-03-26 DIAGNOSIS — F17210 Nicotine dependence, cigarettes, uncomplicated: Secondary | ICD-10-CM | POA: Diagnosis not present

## 2017-03-26 DIAGNOSIS — I700 Atherosclerosis of aorta: Secondary | ICD-10-CM | POA: Insufficient documentation

## 2017-03-26 DIAGNOSIS — F17200 Nicotine dependence, unspecified, uncomplicated: Secondary | ICD-10-CM

## 2017-03-26 DIAGNOSIS — Z122 Encounter for screening for malignant neoplasm of respiratory organs: Secondary | ICD-10-CM | POA: Diagnosis not present

## 2017-03-26 DIAGNOSIS — J439 Emphysema, unspecified: Secondary | ICD-10-CM | POA: Diagnosis not present

## 2017-03-26 IMAGING — CT CT CHEST LUNG CANCER SCREENING LOW DOSE W/O CM
2 of 4 series · 15 of 40 positions shown, 18 images · non-contrast
Comparison: CT chest dated [DATE]

CLINICAL DATA: 66-year-old male current smoker, with 30 pack-year
history of smoking, for initial lung cancer screening

EXAM:
CT CHEST WITHOUT CONTRAST LOW-DOSE FOR LUNG CANCER SCREENING
TECHNIQUE: Multidetector CT imaging of the chest was performed following the
standard protocol without IV contrast.

[Series 2: axial st · axial · 0.69mm/px · z∈[+1263,+1538]mm · 12 of 65 slices shown, 15 images]
[im 5/65  mediastinal]
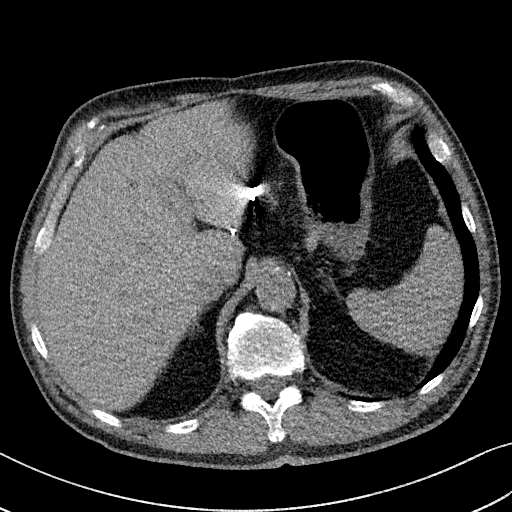
[im 5/65  lung]
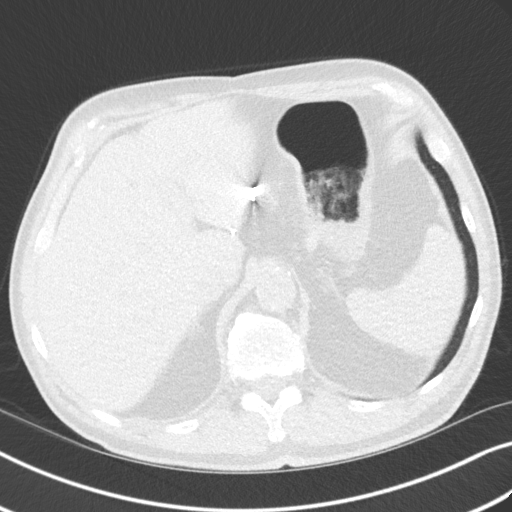
[im 10/65  lung]
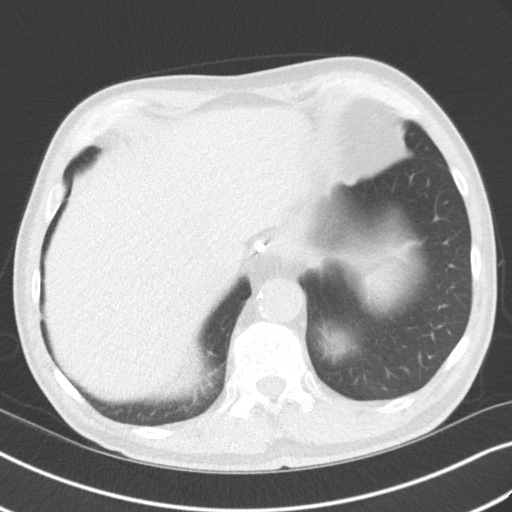
[im 15/65  lung]
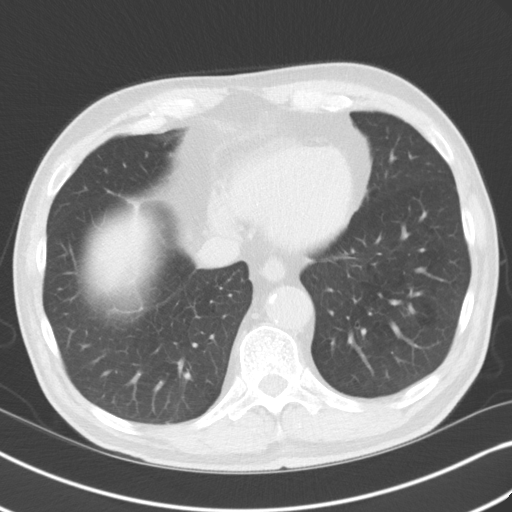
[im 20/65  lung]
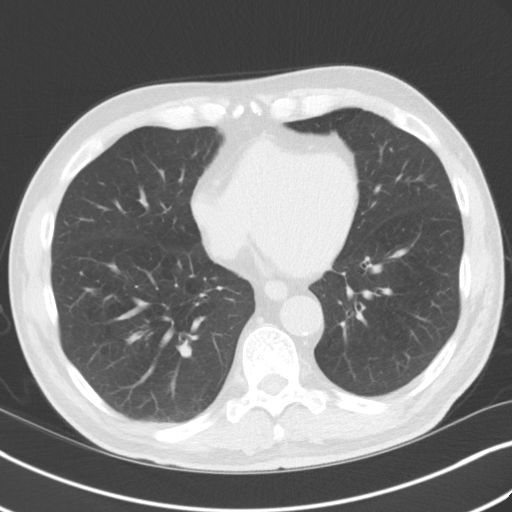
[im 25/65  mediastinal]
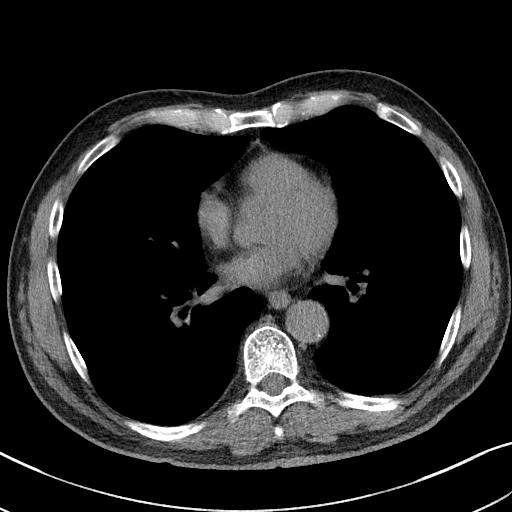
[im 25/65  lung]
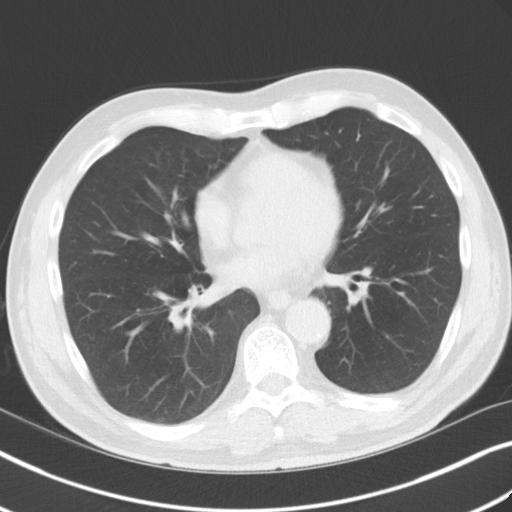
[im 30/65  lung]
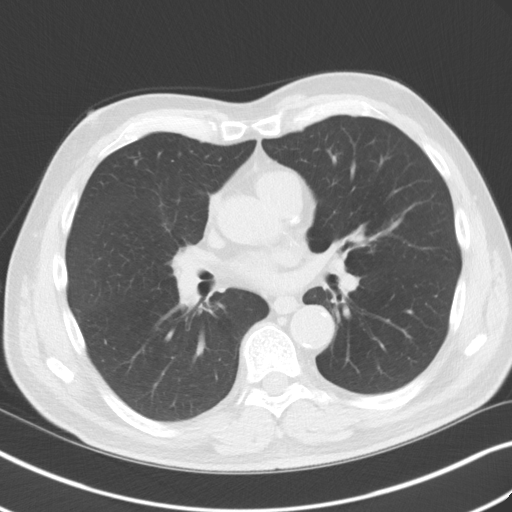
[im 35/65  lung]
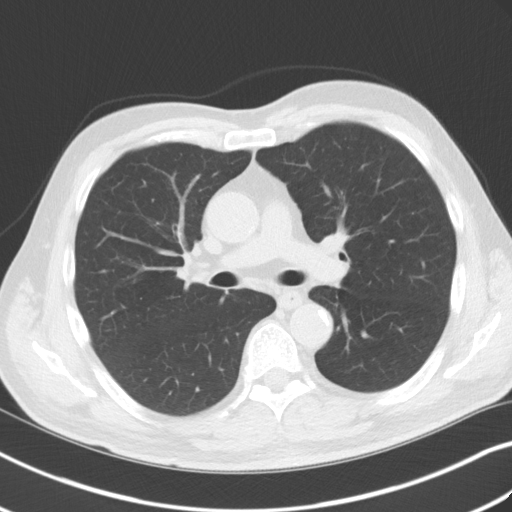
[im 40/65  lung]
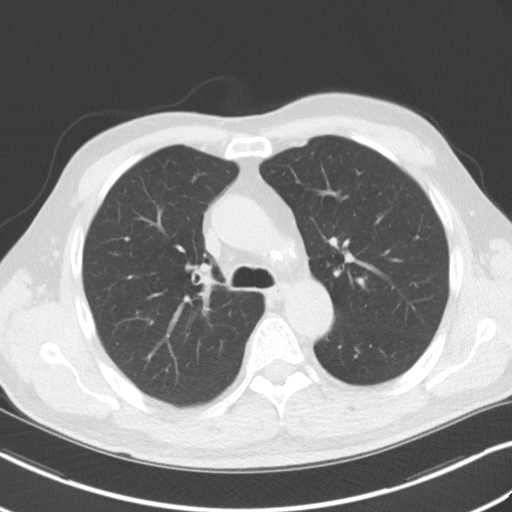
[im 45/65  mediastinal]
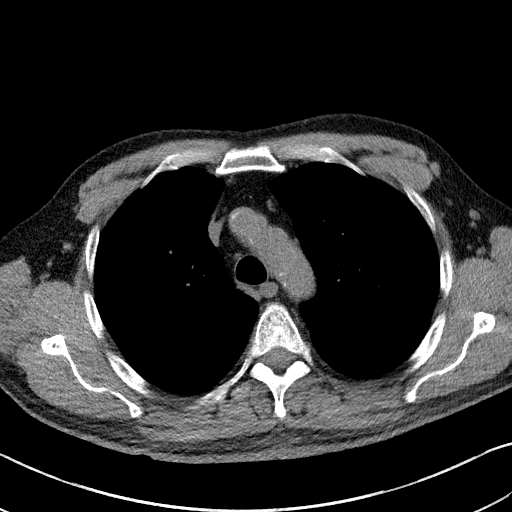
[im 45/65  lung]
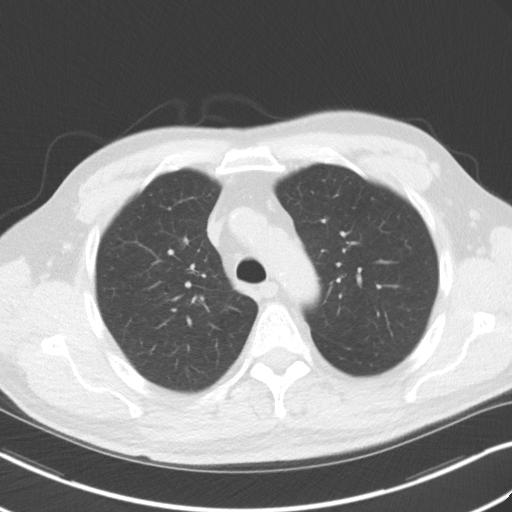
[im 50/65  lung]
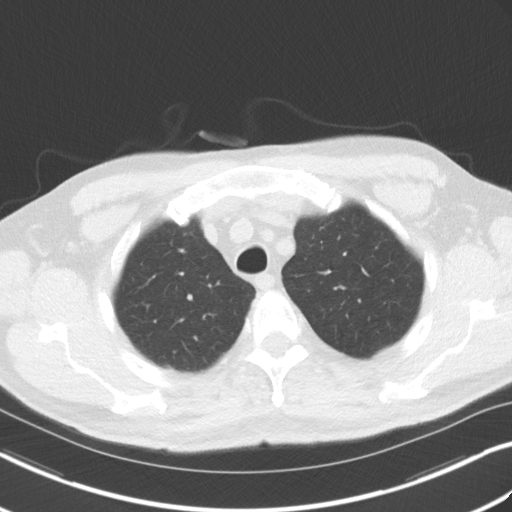
[im 55/65  lung]
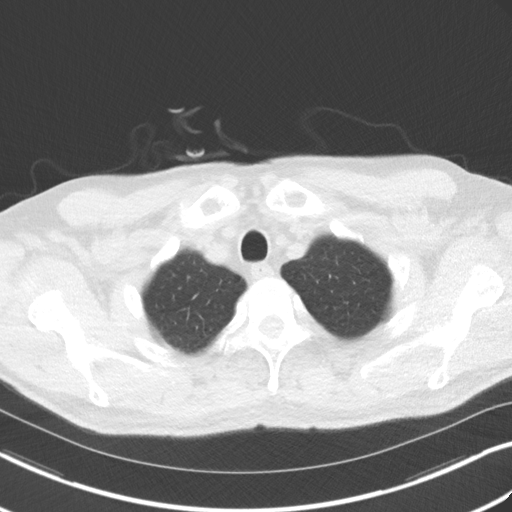
[im 60/65  lung]
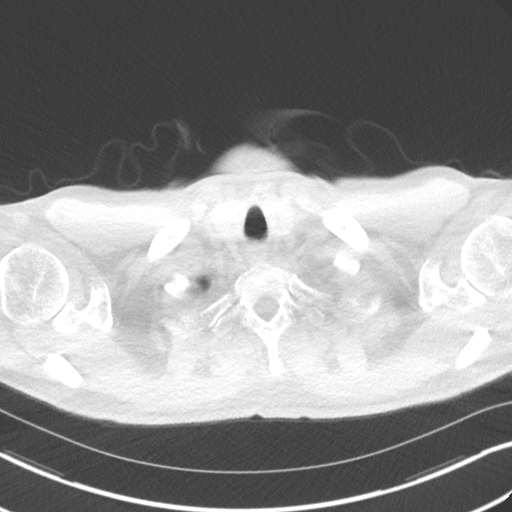

[Series 5: coronal · coronal · 0.63mm/px · 3 of 263 slices shown]
[im 53/263  lung]
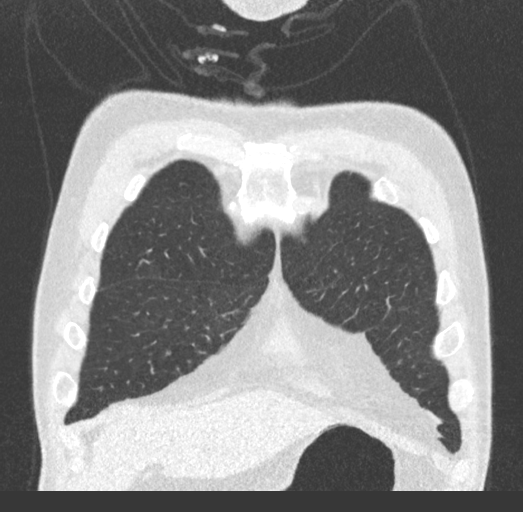
[im 105/263  lung]
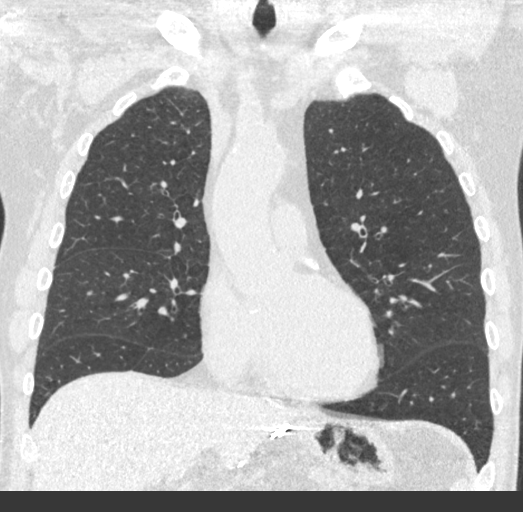
[im 158/263  lung]
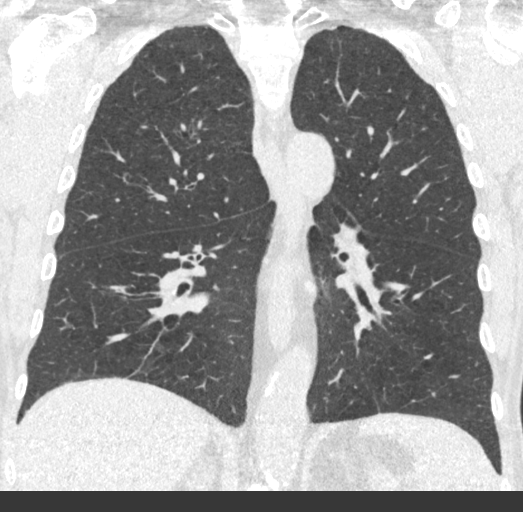

[15 of 40 positions shown; findings below may reference images not displayed]

FINDINGS: Cardiovascular: The heart is normal in size. No pericardial
effusion.

No evidence of thoracic aortic aneurysm. Atherosclerotic
calcifications of the aortic arch.

Mild coronary atherosclerosis the LAD and left circumflex.

Mediastinum/Nodes: No suspicious mediastinal lymphadenopathy.

Visualized thyroid is unremarkable.

Lungs/Pleura: Mild centrilobular emphysematous changes, upper lobe
predominant.

No focal consolidation.

No suspicious pulmonary nodules.

No pleural effusion or pneumothorax.

Upper Abdomen: Visualized upper abdomen is notable for surgical
clips at the GE junction.

Musculoskeletal: Visualized osseous structures are within normal
limits.
IMPRESSION: Lung-RADS 1, negative. Continue annual screening with low-dose chest
CT without contrast in 12 months.

Aortic Atherosclerosis ([0L]-[0L]) and Emphysema ([0L]-[0L]).

## 2017-04-30 DIAGNOSIS — D696 Thrombocytopenia, unspecified: Secondary | ICD-10-CM | POA: Diagnosis not present

## 2017-08-01 ENCOUNTER — Other Ambulatory Visit (HOSPITAL_COMMUNITY): Payer: Self-pay | Admitting: *Deleted

## 2017-08-01 DIAGNOSIS — D510 Vitamin B12 deficiency anemia due to intrinsic factor deficiency: Secondary | ICD-10-CM

## 2017-08-04 ENCOUNTER — Encounter (HOSPITAL_COMMUNITY): Payer: Self-pay | Admitting: Internal Medicine

## 2017-08-04 ENCOUNTER — Inpatient Hospital Stay (HOSPITAL_COMMUNITY): Payer: Medicare HMO | Attending: Hematology

## 2017-08-04 ENCOUNTER — Inpatient Hospital Stay (HOSPITAL_BASED_OUTPATIENT_CLINIC_OR_DEPARTMENT_OTHER): Payer: Medicare HMO | Admitting: Internal Medicine

## 2017-08-04 ENCOUNTER — Other Ambulatory Visit: Payer: Self-pay

## 2017-08-04 DIAGNOSIS — F17210 Nicotine dependence, cigarettes, uncomplicated: Secondary | ICD-10-CM | POA: Diagnosis not present

## 2017-08-04 DIAGNOSIS — D510 Vitamin B12 deficiency anemia due to intrinsic factor deficiency: Secondary | ICD-10-CM | POA: Insufficient documentation

## 2017-08-04 LAB — CBC WITH DIFFERENTIAL/PLATELET
BASOS ABS: 0 10*3/uL (ref 0.0–0.1)
Basophils Relative: 0 %
Eosinophils Absolute: 0.1 10*3/uL (ref 0.0–0.7)
Eosinophils Relative: 1 %
HCT: 45.7 % (ref 39.0–52.0)
HEMOGLOBIN: 16.1 g/dL (ref 13.0–17.0)
LYMPHS PCT: 38 %
Lymphs Abs: 2.6 10*3/uL (ref 0.7–4.0)
MCH: 30.3 pg (ref 26.0–34.0)
MCHC: 35.2 g/dL (ref 30.0–36.0)
MCV: 86.1 fL (ref 78.0–100.0)
Monocytes Absolute: 0.5 10*3/uL (ref 0.1–1.0)
Monocytes Relative: 7 %
NEUTROS ABS: 3.7 10*3/uL (ref 1.7–7.7)
Neutrophils Relative %: 54 %
PLATELETS: 174 10*3/uL (ref 150–400)
RBC: 5.31 MIL/uL (ref 4.22–5.81)
RDW: 13.5 % (ref 11.5–15.5)
WBC: 6.9 10*3/uL (ref 4.0–10.5)

## 2017-08-04 LAB — COMPREHENSIVE METABOLIC PANEL
ALT: 12 U/L — AB (ref 17–63)
AST: 20 U/L (ref 15–41)
Albumin: 3.8 g/dL (ref 3.5–5.0)
Alkaline Phosphatase: 90 U/L (ref 38–126)
Anion gap: 9 (ref 5–15)
BUN: 8 mg/dL (ref 6–20)
CHLORIDE: 101 mmol/L (ref 101–111)
CO2: 23 mmol/L (ref 22–32)
CREATININE: 0.99 mg/dL (ref 0.61–1.24)
Calcium: 9.1 mg/dL (ref 8.9–10.3)
GFR calc Af Amer: >60 mL/min (ref >60)
Glucose, Bld: 143 mg/dL — ABNORMAL HIGH (ref 65–99)
Potassium: 4.1 mmol/L (ref 3.5–5.1)
Sodium: 133 mmol/L — ABNORMAL LOW (ref 135–145)
Total Bilirubin: 1 mg/dL (ref 0.3–1.2)
Total Protein: 7.1 g/dL (ref 6.5–8.1)

## 2017-08-04 LAB — VITAMIN B12: Vitamin B-12: 1480 pg/mL — ABNORMAL HIGH (ref 180–914)

## 2017-08-04 MED ORDER — CYANOCOBALAMIN 1000 MCG/ML IJ SOLN: INTRAMUSCULAR | 13 refills | Status: DC

## 2017-08-04 NOTE — Patient Instructions (Signed)
[place] at [facility] Discharge Instructions  Today you saw Dr Walden Field.    Thank you for choosing [place] at [facility] to provide your oncology and hematology care.  To afford each patient quality time with our provider, please arrive at least 15 minutes before your scheduled appointment time.   If you have a lab appointment with the [facility] please come in thru the  Main Entrance and check in at the main information desk  You need to re-schedule your appointment should you arrive 10 or more minutes late.  We strive to give you quality time with our providers, and arriving late affects you and other patients whose appointments are after yours.  Also, if you no show three or more times for appointments you may be dismissed from the clinic at the providers discretion.     Again, thank you for choosing [facility].  Our hope is that these requests will decrease the amount of time that you wait before being seen by our physicians.       _____________________________________________________________  Should you have questions after your visit to [facility], please contact our office at [phone] between the hours of 8:30 a.m. and 4:30 p.m.  Voicemails left after 4:30 p.m. will not be returned until the following business day.  For prescription refill requests, have your pharmacy contact our office.       Resources For Cancer Patients and their Caregivers ? American Cancer Society: Can assist with transportation, wigs, general needs, runs Look Good Feel Better.        548-596-5132 ? Cancer Care: Provides financial assistance, online support groups, medication/co-pay assistance.  1-800-813-HOPE 661-079-0515) ? [facility] Assists [place] cancer patients and their families through emotional , educational and financial support.  (514) 035-6756 ? [place] DSS Where to apply for food  stamps, Medicaid and utility assistance. 916-519-1035 ? RCATS: Transportation to medical appointments. 828-776-5608 ? Social Security Administration: May apply for disability if have a Stage IV cancer. (314) 879-6326 606-777-8247 ? [employer], Disability and Transit Services: Assists with nutrition, care and transit needs. [place] Support Programs:   > Cancer Support Group  2nd Tuesday of the month 1pm-2pm, Journey Room   > Creative Journey  3rd Tuesday of the month 1130am-1pm, [place] at [facility] Discharge Instructions  Today you saw Dr Walden Field.    Thank you for choosing [place] at [facility] to provide your oncology and hematology care.  To afford each patient quality time with our provider, please arrive at least 15 minutes before your scheduled appointment time.   If you have a lab appointment with the [facility] please come in thru the  Main Entrance and check in at the main information desk  You need to re-schedule your appointment should you arrive 10 or more minutes late.  We strive to give you quality time with our providers, and arriving late affects you and other patients whose appointments are after yours.  Also, if you no show three or more times for appointments you may be dismissed from the clinic at the providers discretion.     Again, thank you for choosing [facility].  Our hope is that these requests will decrease the amount of time that you wait before being seen by our physicians.  _____________________________________________________________  Should you have questions after your visit to [facility], please contact our office at [phone] between the hours of 8:30 a.m. and 4:30 p.m.  Voicemails left after 4:30 p.m. will not be returned until the following business day.  For prescription refill requests, have your pharmacy  contact our office.       Resources For Cancer Patients and their Caregivers ? American Cancer Society: Can assist with transportation, wigs, general needs, runs Look Good Feel Better.        216-818-5779 ? Cancer Care: Provides financial assistance, online support groups, medication/co-pay assistance.  1-800-813-HOPE 806-838-9782) ? [facility] Assists [place] cancer patients and their families through emotional , educational and financial support.  (403)730-2593 ? [place] DSS Where to apply for food stamps, Medicaid and utility assistance. 364-084-8534 ? RCATS: Transportation to medical appointments. 947-486-3461 ? Social Security Administration: May apply for disability if have a Stage IV cancer. 224-057-6822 (201) 691-6690 ? [employer], Disability and Transit Services: Assists with nutrition, care and transit needs. [place] Support Programs:   > Cancer Support Group  2nd Tuesday of the month 1pm-2pm, Journey Room   > Creative Journey  3rd Tuesday of the month 1130am-1pm, Journey Room

## 2017-08-04 NOTE — Progress Notes (Signed)
Diagnosis Pernicious anemia - Plan: CBC with Differential/Platelet, Comprehensive metabolic panel, Lactate dehydrogenase, CANCELED: Vitamin B12  Staging Cancer Staging No matching staging information was found for the patient.    DIAGNOSIS: Pernicious anemia on B12 IM, patient self administers.   CURRENT THERAPY: B12 IM  Assessment and Plan:1.  Pernicious Anemia/B12 deficiency.  He continues B12 shots.  He is requesting refills today.  Labs reviewed with pt and show  on B12 shots without issue. Labs done 08/04/2017 reviewed with pt and show WBC 6.9, HB 16.1 and plts 174,000.  He will follow-up in 1 year for repeat labs.  Continue GI evaluation as recommended.    2.  Smoking.  Pt continues to smoke.  Cessation is recommended.  He had screening CT chest done 03/26/2017 that showed  IMPRESSION: Lung-RADS 1, negative. Continue annual screening with low-dose chest CT without contrast in 12 months.  He should follow-up with lung cancer screening program as directed.    3.  Health maintenance.  Follow-up with GI as recommended.    Current Status:  Pt is seen today for follow-up.  He is here to go over labs.     Problem List Patient Active Problem List   Diagnosis Date Noted  . Pernicious anemia [D51.0] 08/03/2015  . Colon polyps [K63.5] 11/30/2011  . B12 deficiency [E53.8] 11/29/2011  . Unintentional weight loss [R63.4] 11/28/2011  . Heme positive stool [R19.5] 11/28/2011  . Tobacco use disorder [F17.200] 11/28/2011  . PUD (peptic ulcer disease) [K27.9] 11/28/2011    Past Medical History Past Medical History:  Diagnosis Date  . Anemia 11/28/2011  . B12 deficiency 11/29/2011  . CAD in native artery   . Colon polyps 11/30/2011  . GERD (gastroesophageal reflux disease)   . Leukopenia 11/30/2011  . Pancytopenia ([place]) 11/28/2011  . Peptic ulcer 1971  . Unintentional weight loss 11/28/2011    Past Surgical History Past Surgical History:  Procedure Laterality Date  . APPENDECTOMY   1970  . CARDIAC CATHETERIZATION  1996   s/p PTCA/stent (Dr Lia Foyer)  . COLON RESECTION  01/15/2012   Procedure: HAND ASSISTED LAPAROSCOPIC COLON RESECTION;  Surgeon: Jamesetta So, MD;  Location: [facility];  Service: General;  Laterality: N/A;  partial colectomy  . COLONOSCOPY  12/30/2011   RMR: Multiple colonic polyps-status post multiple snare polypectomies and polyp debulking. 1 small rectal polyp removed as described above . Sigmoid and rectum looked good endoscopically.   . ESOPHAGOGASTRODUODENOSCOPY  11/29/2014   RMR: Normal esophagus. Status post prior gastric surgery as described above. Abnoraml proximal small bowel status post biopsy. Status psot gastric biopsy.   Marland Kitchen PARTIAL COLECTOMY  01/15/2012   Procedure: PARTIAL COLECTOMY;  Surgeon: Jamesetta So, MD;  Location: [facility];  Service: General;  Laterality: N/A;  . SEPTOPLASTY    . STOMACH SURGERY  1971   Hx PUD     Family History Family History  Problem Relation Age of Onset  . Heart failure Brother      Social History  reports that he quit smoking about 20 years ago. His smoking use included cigarettes. He has a 22.50 pack-year smoking history. He has never used smokeless tobacco. He reports that he does not drink alcohol or use drugs.  Medications  Current Outpatient Medications:  .  aspirin 81 MG tablet, Take 81 mg by mouth daily., Disp: , Rfl:  .  cyanocobalamin (,VITAMIN B-12,) 1000 MCG/ML injection, INJECT 1 ML INTRAMUSCULARLY ONCE MONTHLY., Disp: 1 mL, Rfl: 13 .  ibuprofen (ADVIL,MOTRIN) 200  MG tablet, Take 200 mg by mouth every 6 (six) hours as needed. Used rarely for pain, Disp: , Rfl:   Allergies Patient has no known allergies.  Review of Systems Review of Systems - Oncology ROS as per HPI otherwise 12 point ROS is negative.   Physical Exam  Vitals Wt Readings from Last 3 Encounters:  08/04/17 167 lb 4.8 oz (75.9 kg)  08/02/16 171 lb 9.6 oz (77.8 kg)  08/02/15 168 lb 9.6 oz (76.5 kg)   Temp Readings  from Last 3 Encounters:  08/04/17 98.1 F (36.7 C) (Oral)  08/02/16 98.1 F (36.7 C) (Oral)  08/02/15 98 F (36.7 C) (Oral)   BP Readings from Last 3 Encounters:  08/04/17 (!) 142/79  08/02/16 (!) 160/97  08/02/15 139/88   Pulse Readings from Last 3 Encounters:  08/04/17 69  08/02/16 92  08/02/15 89   Constitutional: Well-developed, well-nourished, and in no distress.   HENT: Head: Normocephalic and atraumatic.  Mouth/Throat: No oropharyngeal exudate. Mucosa moist. Eyes: Pupils are equal, round, and reactive to light. Conjunctivae are normal. No scleral icterus.  Neck: Normal range of motion. Neck supple. No JVD present.  Cardiovascular: Normal rate, regular rhythm and normal heart sounds.  Exam reveals no gallop and no friction rub.   No murmur heard. Pulmonary/Chest: Effort normal and breath sounds normal. No respiratory distress. No wheezes.No rales.  Abdominal: Soft. Bowel sounds are normal. No distension. There is no tenderness. There is no guarding.  Musculoskeletal: No edema or tenderness.  Lymphadenopathy: No cervical, axillary or supraclavicular adenopathy.  Neurological: Alert and oriented to person, place, and time. No cranial nerve deficit.  Skin: Skin is warm and dry. No rash noted. No erythema. No pallor.  Psychiatric: Affect and judgment normal.   Labs Lab on 08/04/2017  Component Date Value Ref Range Status  . WBC 08/04/2017 6.9  4.0 - 10.5 K/uL Final  . RBC 08/04/2017 5.31  4.22 - 5.81 MIL/uL Final  . Hemoglobin 08/04/2017 16.1  13.0 - 17.0 g/dL Final  . HCT 08/04/2017 45.7  39.0 - 52.0 % Final  . MCV 08/04/2017 86.1  78.0 - 100.0 fL Final  . MCH 08/04/2017 30.3  26.0 - 34.0 pg Final  . MCHC 08/04/2017 35.2  30.0 - 36.0 g/dL Final  . RDW 08/04/2017 13.5  11.5 - 15.5 % Final  . Platelets 08/04/2017 174  150 - 400 K/uL Final  . Neutrophils Relative % 08/04/2017 54  % Final  . Neutro Abs 08/04/2017 3.7  1.7 - 7.7 K/uL Final  . Lymphocytes Relative  08/04/2017 38  % Final  . Lymphs Abs 08/04/2017 2.6  0.7 - 4.0 K/uL Final  . Monocytes Relative 08/04/2017 7  % Final  . Monocytes Absolute 08/04/2017 0.5  0.1 - 1.0 K/uL Final  . Eosinophils Relative 08/04/2017 1  % Final  . Eosinophils Absolute 08/04/2017 0.1  0.0 - 0.7 K/uL Final  . Basophils Relative 08/04/2017 0  % Final  . Basophils Absolute 08/04/2017 0.0  0.0 - 0.1 K/uL Final   Performed at [facility], [address] [place], [place] [address]  . Sodium 08/04/2017 133* 135 - 145 mmol/L Final  . Potassium 08/04/2017 4.1  3.5 - 5.1 mmol/L Final  . Chloride 08/04/2017 101  101 - 111 mmol/L Final  . CO2 08/04/2017 23  22 - 32 mmol/L Final  . Glucose, Bld 08/04/2017 143* 65 - 99 mg/dL Final  . BUN 08/04/2017 8  6 - 20 mg/dL Final  . Creatinine,  Ser 08/04/2017 0.99  0.61 - 1.24 mg/dL Final  . Calcium 08/04/2017 9.1  8.9 - 10.3 mg/dL Final  . Total Protein 08/04/2017 7.1  6.5 - 8.1 g/dL Final  . Albumin 08/04/2017 3.8  3.5 - 5.0 g/dL Final  . AST 08/04/2017 20  15 - 41 U/L Final  . ALT 08/04/2017 12* 17 - 63 U/L Final  . Alkaline Phosphatase 08/04/2017 90  38 - 126 U/L Final  . Total Bilirubin 08/04/2017 1.0  0.3 - 1.2 mg/dL Final  . GFR calc non Af Amer 08/04/2017 >60  >60 mL/min Final  . GFR calc Af Amer 08/04/2017 >60  >60 mL/min Final   Comment: (NOTE) The eGFR has been calculated using the CKD EPI equation. This calculation has not been validated in all clinical situations. eGFR's persistently <60 mL/min signify possible Chronic Kidney Disease.   Georgiann Hahn gap 08/04/2017 9  5 - 15 Final   Performed at [facility], [address], [place], [place] [address]     Pathology Orders Placed This Encounter  Procedures  . CBC with Differential/Platelet    Standing Status:   Future    Standing Expiration Date:   08/05/2019  . Comprehensive metabolic panel    Standing Status:   Future    Standing Expiration Date:   08/05/2019  . Lactate dehydrogenase    Standing Status:   Future     Standing Expiration Date:   08/05/2019       Zoila Shutter [facility] Nurse MD

## 2018-01-13 DIAGNOSIS — Z0001 Encounter for general adult medical examination with abnormal findings: Secondary | ICD-10-CM | POA: Diagnosis not present

## 2018-01-13 DIAGNOSIS — Z1389 Encounter for screening for other disorder: Secondary | ICD-10-CM | POA: Diagnosis not present

## 2018-01-13 DIAGNOSIS — Z23 Encounter for immunization: Secondary | ICD-10-CM | POA: Diagnosis not present

## 2018-01-13 DIAGNOSIS — E663 Overweight: Secondary | ICD-10-CM | POA: Diagnosis not present

## 2018-01-13 DIAGNOSIS — Z6826 Body mass index (BMI) 26.0-26.9, adult: Secondary | ICD-10-CM | POA: Diagnosis not present

## 2018-07-24 ENCOUNTER — Other Ambulatory Visit (HOSPITAL_COMMUNITY): Payer: Self-pay | Admitting: *Deleted

## 2018-07-24 DIAGNOSIS — D510 Vitamin B12 deficiency anemia due to intrinsic factor deficiency: Secondary | ICD-10-CM

## 2018-07-24 DIAGNOSIS — E538 Deficiency of other specified B group vitamins: Secondary | ICD-10-CM

## 2018-07-27 ENCOUNTER — Other Ambulatory Visit: Payer: Self-pay

## 2018-07-27 ENCOUNTER — Inpatient Hospital Stay (HOSPITAL_COMMUNITY): Payer: Medicare HMO | Attending: Hematology

## 2018-07-27 DIAGNOSIS — E538 Deficiency of other specified B group vitamins: Secondary | ICD-10-CM | POA: Insufficient documentation

## 2018-07-27 DIAGNOSIS — D510 Vitamin B12 deficiency anemia due to intrinsic factor deficiency: Secondary | ICD-10-CM

## 2018-07-27 DIAGNOSIS — I2510 Atherosclerotic heart disease of native coronary artery without angina pectoris: Secondary | ICD-10-CM | POA: Diagnosis not present

## 2018-07-27 DIAGNOSIS — Z8249 Family history of ischemic heart disease and other diseases of the circulatory system: Secondary | ICD-10-CM | POA: Diagnosis not present

## 2018-07-27 DIAGNOSIS — Z87891 Personal history of nicotine dependence: Secondary | ICD-10-CM | POA: Diagnosis not present

## 2018-07-27 LAB — CBC WITH DIFFERENTIAL/PLATELET
Abs Immature Granulocytes: 0.01 10*3/uL (ref 0.00–0.07)
Basophils Absolute: 0 10*3/uL (ref 0.0–0.1)
Basophils Relative: 1 %
Eosinophils Absolute: 0.1 10*3/uL (ref 0.0–0.5)
Eosinophils Relative: 2 %
HCT: 45.5 % (ref 39.0–52.0)
Hemoglobin: 15.7 g/dL (ref 13.0–17.0)
Immature Granulocytes: 0 %
Lymphocytes Relative: 38 %
Lymphs Abs: 2.6 10*3/uL (ref 0.7–4.0)
MCH: 29.8 pg (ref 26.0–34.0)
MCHC: 34.5 g/dL (ref 30.0–36.0)
MCV: 86.5 fL (ref 80.0–100.0)
Monocytes Absolute: 0.5 10*3/uL (ref 0.1–1.0)
Monocytes Relative: 7 %
Neutro Abs: 3.7 10*3/uL (ref 1.7–7.7)
Neutrophils Relative %: 52 %
Platelets: 162 10*3/uL (ref 150–400)
RBC: 5.26 MIL/uL (ref 4.22–5.81)
RDW: 13.7 % (ref 11.5–15.5)
WBC: 7 10*3/uL (ref 4.0–10.5)
nRBC: 0 % (ref 0.0–0.2)

## 2018-07-27 LAB — COMPREHENSIVE METABOLIC PANEL
ALT: 9 U/L (ref 0–44)
AST: 15 U/L (ref 15–41)
Albumin: 3.6 g/dL (ref 3.5–5.0)
Alkaline Phosphatase: 88 U/L (ref 38–126)
Anion gap: 6 (ref 5–15)
BUN: 8 mg/dL (ref 8–23)
CO2: 27 mmol/L (ref 22–32)
Calcium: 8.9 mg/dL (ref 8.9–10.3)
Chloride: 105 mmol/L (ref 98–111)
Creatinine, Ser: 0.85 mg/dL (ref 0.61–1.24)
GFR calc Af Amer: >60 mL/min (ref >60)
GFR calc non Af Amer: >60 mL/min (ref >60)
Glucose, Bld: 114 mg/dL — ABNORMAL HIGH (ref 70–99)
Potassium: 4.3 mmol/L (ref 3.5–5.1)
Sodium: 138 mmol/L (ref 135–145)
Total Bilirubin: 0.9 mg/dL (ref 0.3–1.2)
Total Protein: 6.8 g/dL (ref 6.5–8.1)

## 2018-07-27 LAB — LACTATE DEHYDROGENASE: LDH: 118 U/L (ref 98–192)

## 2018-08-03 ENCOUNTER — Other Ambulatory Visit: Payer: Self-pay

## 2018-08-03 ENCOUNTER — Inpatient Hospital Stay (HOSPITAL_COMMUNITY): Payer: Medicare HMO | Admitting: Hematology

## 2018-08-03 ENCOUNTER — Encounter (HOSPITAL_COMMUNITY): Payer: Self-pay | Admitting: Hematology

## 2018-08-03 DIAGNOSIS — Z87891 Personal history of nicotine dependence: Secondary | ICD-10-CM | POA: Diagnosis not present

## 2018-08-03 DIAGNOSIS — I2510 Atherosclerotic heart disease of native coronary artery without angina pectoris: Secondary | ICD-10-CM | POA: Diagnosis not present

## 2018-08-03 DIAGNOSIS — E538 Deficiency of other specified B group vitamins: Secondary | ICD-10-CM

## 2018-08-03 DIAGNOSIS — Z122 Encounter for screening for malignant neoplasm of respiratory organs: Secondary | ICD-10-CM

## 2018-08-03 DIAGNOSIS — Z8249 Family history of ischemic heart disease and other diseases of the circulatory system: Secondary | ICD-10-CM

## 2018-08-03 NOTE — Assessment & Plan Note (Signed)
1.  B12 deficiency: -Thought to be secondary to pernicious anemia.  He is on B12 injections monthly. -I reviewed his blood work.  Hemoglobin is 15.7.  MCV is 86. -She will continue B12 injections. -I will see him back in 6 months for follow-up.  2.  Smoking history: - He has 30+-pack-year smoking history.  He qualifies for low-dose chest CT. - He had a low-dose screening CT scan on 03/26/2017 which was lung RADS 1.  We will schedule for another scan.

## 2018-08-03 NOTE — Progress Notes (Signed)
[place],  [id number]   CLINIC:  Medical Oncology/Hematology  PCP:  Sharilyn Sites, [place] [address] [phone]   REASON FOR VISIT:  Follow-up for B12 deficiency.  CURRENT THERAPY: B12 injections monthly.   INTERVAL HISTORY: He comes back for B12 deficiency.  Denies any fevers, night sweats or weight loss.  Denies any bleeding per rectum or melena.  Denies any recent hospitalizations or ER visits.  He continues to work at [employer] in [place] part-time.  He is continuing to smoke cigarettes.  Appetite is 100%.  Energy levels are 50%.  No pain was reported.   REVIEW OF SYSTEMS:  Review of Systems  All other systems reviewed and are negative.    PAST MEDICAL/SURGICAL HISTORY:  Past Medical History:  Diagnosis Date  . Anemia 11/28/2011  . B12 deficiency 11/29/2011  . CAD in native artery   . Colon polyps 11/30/2011  . GERD (gastroesophageal reflux disease)   . Leukopenia 11/30/2011  . Pancytopenia ([place]) 11/28/2011  . Peptic ulcer 1971  . Unintentional weight loss 11/28/2011   Past Surgical History:  Procedure Laterality Date  . APPENDECTOMY  1970  . CARDIAC CATHETERIZATION  1996   s/p PTCA/stent (Dr Lia Foyer)  . COLON RESECTION  01/15/2012   Procedure: HAND ASSISTED LAPAROSCOPIC COLON RESECTION;  Surgeon: Jamesetta So, MD;  Location: [facility];  Service: General;  Laterality: N/A;  partial colectomy  . COLONOSCOPY  12/30/2011   RMR: Multiple colonic polyps-status post multiple snare polypectomies and polyp debulking. 1 small rectal polyp removed as described above . Sigmoid and rectum looked good endoscopically.   . ESOPHAGOGASTRODUODENOSCOPY  11/29/2014   RMR: Normal esophagus. Status post prior gastric surgery as described above. Abnoraml proximal small bowel status post biopsy. Status psot gastric biopsy.   Marland Kitchen PARTIAL COLECTOMY  01/15/2012   Procedure: PARTIAL COLECTOMY;  Surgeon:  Jamesetta So, MD;  Location: [facility];  Service: General;  Laterality: N/A;  . SEPTOPLASTY    . STOMACH SURGERY  1971   Hx PUD      SOCIAL HISTORY:  Social History   Socioeconomic History  . Marital status: Widowed    Spouse name: Not on file  . Number of children: 2  . Years of education: Not on file  . Highest education level: Not on file  Occupational History  . Occupation: Psychiatric nurse: [employer]  . Financial resource strain: Not on file  . Food insecurity:    Worry: Not on file    Inability: Not on file  . Transportation needs:    Medical: Not on file    Non-medical: Not on file  Tobacco Use  . Smoking status: Former Smoker    Packs/day: 0.50    Years: 45.00    Pack years: 22.50    Types: Cigarettes    Last attempt to quit: 11/27/1996    Years since quitting: 21.6  . Smokeless tobacco: Never Used  Substance and Sexual Activity  . Alcohol use: No  . Drug use: No  . Sexual activity: Yes  Lifestyle  . Physical activity:    Days per week: Not on file    Minutes per session: Not on file  . Stress: Not on file  Relationships  . Social connections:    Talks on phone: Not on file    Gets together: Not on file    Attends religious  service: Not on file    Active member of club or organization: Not on file    Attends meetings of clubs or organizations: Not on file    Relationship status: Not on file  . Intimate partner violence:    Fear of current or ex partner: Not on file    Emotionally abused: Not on file    Physically abused: Not on file    Forced sexual activity: Not on file  Other Topics Concern  . Not on file  Social History Narrative  . Not on file    FAMILY HISTORY:  Family History  Problem Relation Age of Onset  . Heart failure Brother     CURRENT MEDICATIONS:  Outpatient Encounter Medications as of 08/03/2018  Medication Sig  . aspirin 81 MG tablet Take 81 mg by mouth daily.  .  cyanocobalamin (,VITAMIN B-12,) 1000 MCG/ML injection INJECT 1 ML INTRAMUSCULARLY ONCE MONTHLY.  Marland Kitchen ibuprofen (ADVIL,MOTRIN) 200 MG tablet Take 200 mg by mouth every 6 (six) hours as needed. Used rarely for pain   No facility-administered encounter medications on file as of 08/03/2018.     ALLERGIES:  No Known Allergies   PHYSICAL EXAM:  ECOG Performance status: 1  Vitals:   08/03/18 1444  BP: (!) 151/75  Pulse: 67  Resp: 18  Temp: 97.9 F (36.6 C)  SpO2: 99%   Filed Weights   08/03/18 1444  Weight: 175 lb 6.4 oz (79.6 kg)    Physical Exam Vitals signs reviewed.  Constitutional:      Appearance: Normal appearance.  Cardiovascular:     Rate and Rhythm: Normal rate and regular rhythm.     Heart sounds: Normal heart sounds.  Pulmonary:     Effort: Pulmonary effort is normal.     Breath sounds: Normal breath sounds.  Abdominal:     General: There is no distension.     Palpations: Abdomen is soft. There is no mass.  Musculoskeletal:        General: No swelling.  Skin:    General: Skin is warm.  Neurological:     General: No focal deficit present.     Mental Status: He is alert and oriented to person, place, and time.  Psychiatric:        Mood and Affect: Mood normal.        Behavior: Behavior normal.      LABORATORY DATA:  I have reviewed the labs as listed.  CBC    Component Value Date/Time   WBC 7.0 07/27/2018 1337   RBC 5.26 07/27/2018 1337   HGB 15.7 07/27/2018 1337   HCT 45.5 07/27/2018 1337   HCT 15 11/19/2011 0827   PLT 162 07/27/2018 1337   MCV 86.5 07/27/2018 1337   MCH 29.8 07/27/2018 1337   MCHC 34.5 07/27/2018 1337   RDW 13.7 07/27/2018 1337   LYMPHSABS 2.6 07/27/2018 1337   MONOABS 0.5 07/27/2018 1337   EOSABS 0.1 07/27/2018 1337   BASOSABS 0.0 07/27/2018 1337   CMP Latest Ref Rng & Units 07/27/2018 08/04/2017 01/17/2012  Glucose 70 - 99 mg/dL 114(H) 143(H) 111(H)  BUN 8 - 23 mg/dL 8 8 5(L)  Creatinine 0.61 - 1.24 mg/dL 0.85 0.99 0.70   Sodium 135 - 145 mmol/L 138 133(L) 137  Potassium 3.5 - 5.1 mmol/L 4.3 4.1 4.1  Chloride 98 - 111 mmol/L 105 101 103  CO2 22 - 32 mmol/L 27 23 26   Calcium 8.9 - 10.3 mg/dL 8.9 9.1 8.6  Total Protein  6.5 - 8.1 g/dL 6.8 7.1 -  Total Bilirubin 0.3 - 1.2 mg/dL 0.9 1.0 -  Alkaline Phos 38 - 126 U/L 88 90 -  AST 15 - 41 U/L 15 20 -  ALT 0 - 44 U/L 9 12(L) -    PENDING LABS:    DIAGNOSTIC IMAGING:  I have personally reviewed his prior CT scan.   ASSESSMENT & PLAN:   B12 deficiency 1.  B12 deficiency: -Thought to be secondary to pernicious anemia.  He is on B12 injections monthly. -I reviewed his blood work.  Hemoglobin is 15.7.  MCV is 86. -She will continue B12 injections. -I will see him back in 6 months for follow-up.  2.  Smoking history: - He has 30+-pack-year smoking history.  He qualifies for low-dose chest CT. - He had a low-dose screening CT scan on 03/26/2017 which was lung RADS 1.  We will schedule for another scan.        Orders placed this encounter:  Orders Placed This Encounter  Procedures  . CT CHEST LUNG CA SCREEN LOW DOSE W/O CM      Derek Jack, MD [place] [phone]

## 2018-09-10 DIAGNOSIS — H5200 Hypermetropia, unspecified eye: Secondary | ICD-10-CM | POA: Diagnosis not present

## 2018-09-10 DIAGNOSIS — Z0100 Encounter for examination of eyes and vision without abnormal findings: Secondary | ICD-10-CM | POA: Diagnosis not present

## 2018-10-19 ENCOUNTER — Other Ambulatory Visit (HOSPITAL_COMMUNITY): Payer: Self-pay | Admitting: Emergency Medicine

## 2018-10-19 MED ORDER — CYANOCOBALAMIN 1000 MCG/ML IJ SOLN: INTRAMUSCULAR | 13 refills | Status: AC

## 2019-01-15 DIAGNOSIS — Z1389 Encounter for screening for other disorder: Secondary | ICD-10-CM | POA: Diagnosis not present

## 2019-01-15 DIAGNOSIS — Z6825 Body mass index (BMI) 25.0-25.9, adult: Secondary | ICD-10-CM | POA: Diagnosis not present

## 2019-01-15 DIAGNOSIS — Z125 Encounter for screening for malignant neoplasm of prostate: Secondary | ICD-10-CM | POA: Diagnosis not present

## 2019-01-15 DIAGNOSIS — F17290 Nicotine dependence, other tobacco product, uncomplicated: Secondary | ICD-10-CM | POA: Diagnosis not present

## 2019-01-15 DIAGNOSIS — Z0001 Encounter for general adult medical examination with abnormal findings: Secondary | ICD-10-CM | POA: Diagnosis not present

## 2019-01-15 DIAGNOSIS — Z23 Encounter for immunization: Secondary | ICD-10-CM | POA: Diagnosis not present

## 2019-01-15 DIAGNOSIS — E663 Overweight: Secondary | ICD-10-CM | POA: Diagnosis not present

## 2019-03-15 ENCOUNTER — Encounter (HOSPITAL_COMMUNITY): Payer: Self-pay

## 2019-03-23 ENCOUNTER — Ambulatory Visit (HOSPITAL_COMMUNITY)
Admission: RE | Admit: 2019-03-23 | Discharge: 2019-03-23 | Disposition: A | Discharge status: Home / Self Care | Payer: Medicare HMO | Source: Ambulatory Visit | Attending: Hematology | Admitting: Hematology

## 2019-03-23 ENCOUNTER — Inpatient Hospital Stay (HOSPITAL_COMMUNITY): Payer: Medicare HMO | Attending: Hematology

## 2019-03-23 ENCOUNTER — Other Ambulatory Visit: Payer: Self-pay

## 2019-03-23 DIAGNOSIS — F17210 Nicotine dependence, cigarettes, uncomplicated: Secondary | ICD-10-CM | POA: Diagnosis not present

## 2019-03-23 DIAGNOSIS — Z87891 Personal history of nicotine dependence: Secondary | ICD-10-CM | POA: Diagnosis not present

## 2019-03-23 DIAGNOSIS — Z122 Encounter for screening for malignant neoplasm of respiratory organs: Secondary | ICD-10-CM

## 2019-03-23 DIAGNOSIS — Z7982 Long term (current) use of aspirin: Secondary | ICD-10-CM | POA: Insufficient documentation

## 2019-03-23 DIAGNOSIS — E538 Deficiency of other specified B group vitamins: Secondary | ICD-10-CM | POA: Insufficient documentation

## 2019-03-23 DIAGNOSIS — J439 Emphysema, unspecified: Secondary | ICD-10-CM | POA: Insufficient documentation

## 2019-03-23 DIAGNOSIS — I2510 Atherosclerotic heart disease of native coronary artery without angina pectoris: Secondary | ICD-10-CM | POA: Diagnosis not present

## 2019-03-23 DIAGNOSIS — I700 Atherosclerosis of aorta: Secondary | ICD-10-CM | POA: Diagnosis not present

## 2019-03-23 LAB — CBC WITH DIFFERENTIAL/PLATELET
Abs Immature Granulocytes: 0.01 10*3/uL (ref 0.00–0.07)
Basophils Absolute: 0 10*3/uL (ref 0.0–0.1)
Basophils Relative: 0 %
Eosinophils Absolute: 0.1 10*3/uL (ref 0.0–0.5)
Eosinophils Relative: 2 %
HCT: 46.9 % (ref 39.0–52.0)
Hemoglobin: 15.8 g/dL (ref 13.0–17.0)
Immature Granulocytes: 0 %
Lymphocytes Relative: 40 %
Lymphs Abs: 2.9 10*3/uL (ref 0.7–4.0)
MCH: 29.6 pg (ref 26.0–34.0)
MCHC: 33.7 g/dL (ref 30.0–36.0)
MCV: 88 fL (ref 80.0–100.0)
Monocytes Absolute: 0.5 10*3/uL (ref 0.1–1.0)
Monocytes Relative: 7 %
Neutro Abs: 3.7 10*3/uL (ref 1.7–7.7)
Neutrophils Relative %: 51 %
Platelets: 161 10*3/uL (ref 150–400)
RBC: 5.33 MIL/uL (ref 4.22–5.81)
RDW: 13.5 % (ref 11.5–15.5)
WBC: 7.2 10*3/uL (ref 4.0–10.5)
nRBC: 0 % (ref 0.0–0.2)

## 2019-03-23 LAB — COMPREHENSIVE METABOLIC PANEL
ALT: 12 U/L (ref 0–44)
AST: 15 U/L (ref 15–41)
Albumin: 3.7 g/dL (ref 3.5–5.0)
Alkaline Phosphatase: 95 U/L (ref 38–126)
Anion gap: 8 (ref 5–15)
BUN: 8 mg/dL (ref 8–23)
CO2: 25 mmol/L (ref 22–32)
Calcium: 9 mg/dL (ref 8.9–10.3)
Chloride: 104 mmol/L (ref 98–111)
Creatinine, Ser: 0.95 mg/dL (ref 0.61–1.24)
GFR calc Af Amer: >60 mL/min (ref >60)
GFR calc non Af Amer: >60 mL/min (ref >60)
Glucose, Bld: 107 mg/dL — ABNORMAL HIGH (ref 70–99)
Potassium: 4 mmol/L (ref 3.5–5.1)
Sodium: 137 mmol/L (ref 135–145)
Total Bilirubin: 0.8 mg/dL (ref 0.3–1.2)
Total Protein: 6.8 g/dL (ref 6.5–8.1)

## 2019-03-23 LAB — VITAMIN B12: Vitamin B-12: 413 pg/mL (ref 180–914)

## 2019-03-23 IMAGING — CT CT CHEST LUNG CANCER SCREENING LOW DOSE W/O CM
3 of 5 series · 13 of 40 positions shown, 14 images · non-contrast
Comparison: [DATE]

CLINICAL DATA: Lung cancer screening. Current asymptomatic smoker.
Thirty-two pack-year history.

EXAM:
CT CHEST WITHOUT CONTRAST LOW-DOSE FOR LUNG CANCER SCREENING
TECHNIQUE: Multidetector CT imaging of the chest was performed following the
standard protocol without IV contrast.

[Series 2: axial st · axial · 0.69mm/px · z∈[-299,-174]mm · 2 of 77 slices shown, 3 images]
[im 26/77  mediastinal]
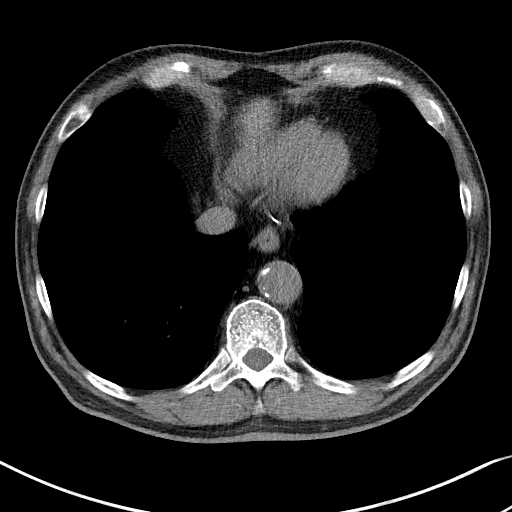
[im 26/77  lung]
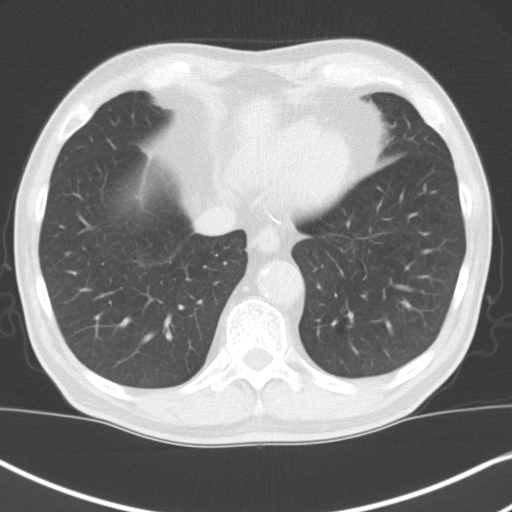
[im 51/77  lung]
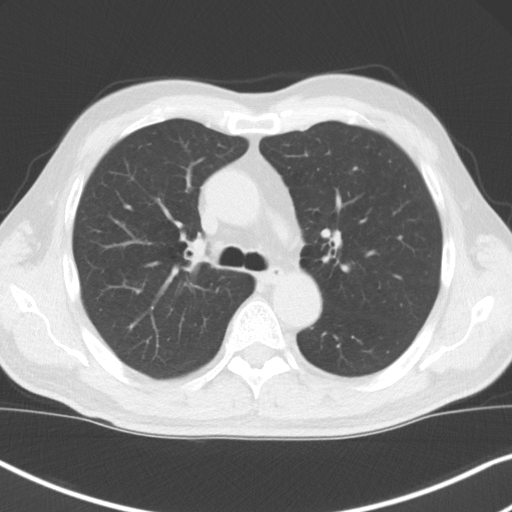

[Series 4: lungs · axial · 0.69mm/px · z∈[-363,-76]mm · 8 of 351 slices shown]
[im 32/351  lung]
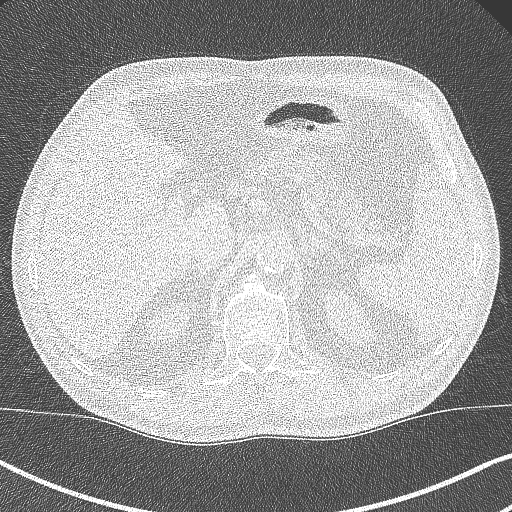
[im 64/351  lung]
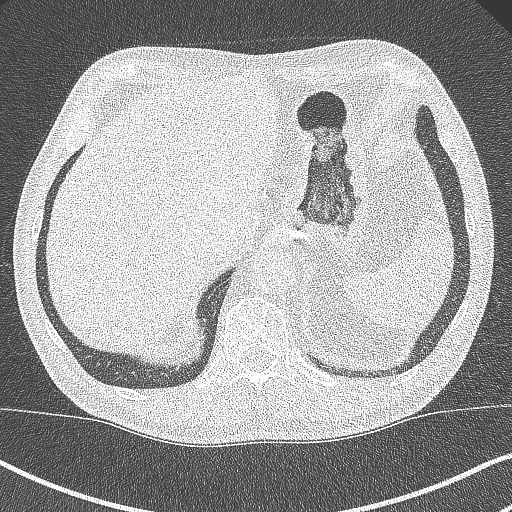
[im 112/351  lung]
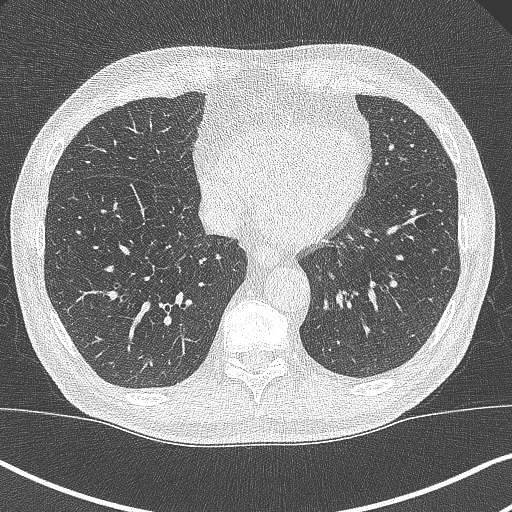
[im 160/351  lung]
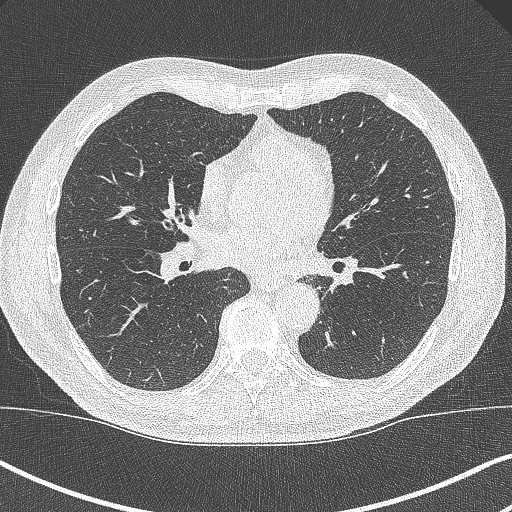
[im 191/351  lung]
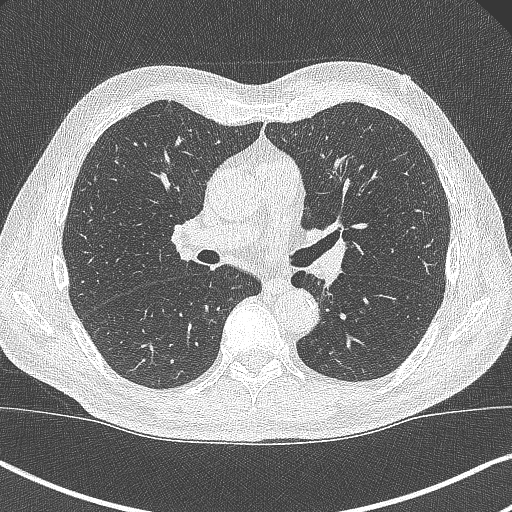
[im 239/351  lung]
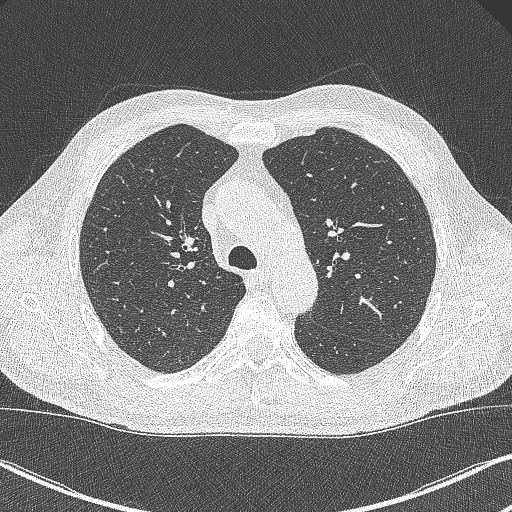
[im 287/351  lung]
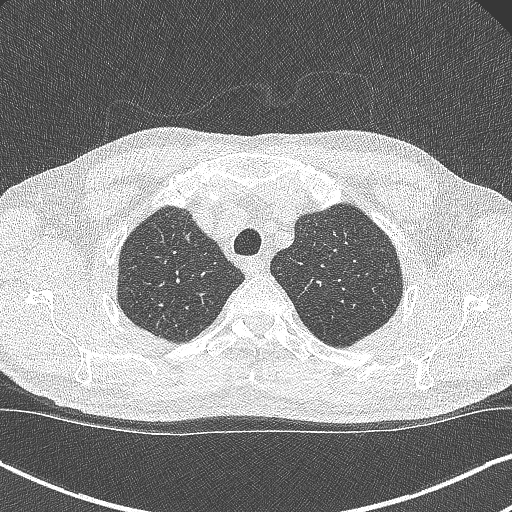
[im 319/351  lung]
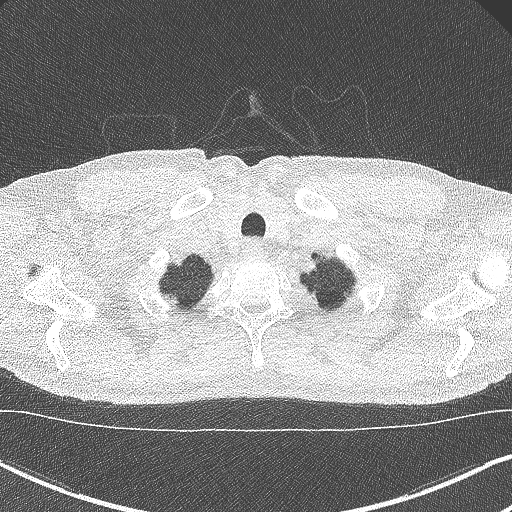

[Series 5: coronal · coronal · 0.68mm/px · 3 of 270 slices shown]
[im 54/270  lung]
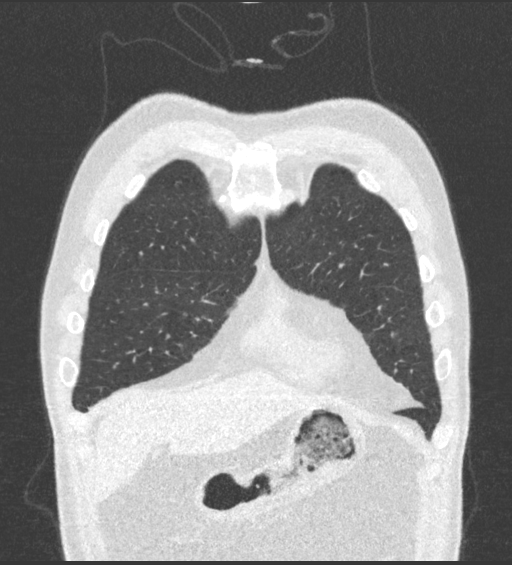
[im 108/270  lung]
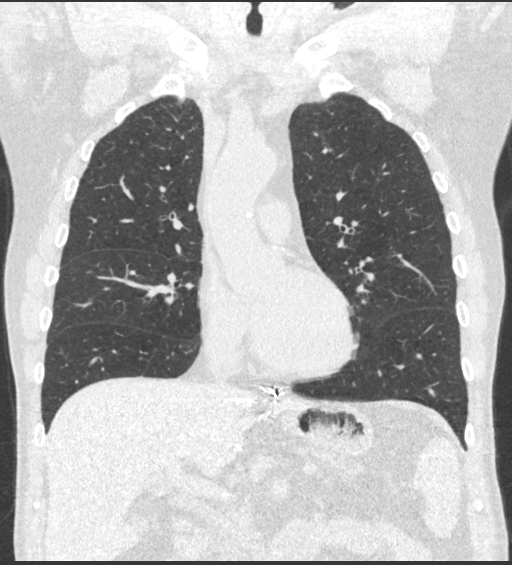
[im 162/270  lung]
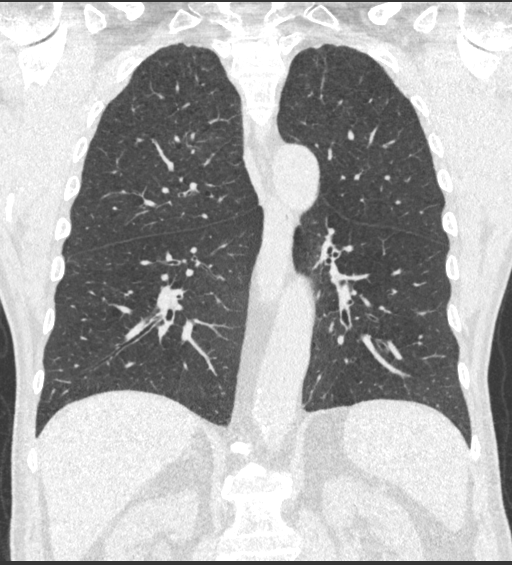

[13 of 40 positions shown; findings below may reference images not displayed]

FINDINGS: Cardiovascular: Heart size normal. No pericardial effusion. Aortic
atherosclerosis. Lad and left circumflex coronary artery
calcifications.

Mediastinum/Nodes: No enlarged mediastinal, hilar, or axillary lymph
nodes. Thyroid gland, trachea, and esophagus demonstrate no
significant findings.

Lungs/Pleura: No pleural effusion. Centrilobular and paraseptal
emphysema identified. No airspace consolidation, atelectasis or
pneumothorax. No suspicious pulmonary nodules.

Upper Abdomen: No acute abnormality. Postsurgical changes noted at
the GE junction. Unchanged low-density structure within segment 4a
measuring 7 mm, image 61/2.

Musculoskeletal: Chronic superior endplate deformity involving the
L1 vertebra is stable. No acute or suspicious osseous abnormalities.
IMPRESSION: 1. Lung-RADS 1, negative. Continue annual screening with low-dose
chest CT without contrast in 12 months.
2. Aortic atherosclerosis. Coronary artery calcifications noted.

Aortic Atherosclerosis ([AH]-[AH]) and Emphysema ([AH]-[AH]).

## 2019-03-24 ENCOUNTER — Encounter (HOSPITAL_COMMUNITY): Payer: Self-pay | Admitting: *Deleted

## 2019-03-24 NOTE — Progress Notes (Signed)
Patient notified via mail of LDCT lung cancer screening results with recommendations to follow up in 12 months.  Also notified of incidental findings and need to follow up with PCP.  Patient will be seen here in our office tomorrow with Dr. Delton Coombes and he will also review these findings with him at that time.

## 2019-03-25 ENCOUNTER — Other Ambulatory Visit: Payer: Self-pay

## 2019-03-25 ENCOUNTER — Other Ambulatory Visit (HOSPITAL_COMMUNITY): Payer: Self-pay | Admitting: *Deleted

## 2019-03-25 ENCOUNTER — Encounter (HOSPITAL_COMMUNITY): Payer: Self-pay | Admitting: Hematology

## 2019-03-25 ENCOUNTER — Inpatient Hospital Stay (HOSPITAL_COMMUNITY): Payer: Medicare HMO | Admitting: Hematology

## 2019-03-25 VITALS — BP 185/83 | HR 76 | Temp 97.1°F | Resp 18 | Wt 173.2 lb

## 2019-03-25 DIAGNOSIS — E538 Deficiency of other specified B group vitamins: Secondary | ICD-10-CM | POA: Diagnosis not present

## 2019-03-25 DIAGNOSIS — Z87891 Personal history of nicotine dependence: Secondary | ICD-10-CM | POA: Diagnosis not present

## 2019-03-25 DIAGNOSIS — Z7982 Long term (current) use of aspirin: Secondary | ICD-10-CM | POA: Diagnosis not present

## 2019-03-25 NOTE — Progress Notes (Signed)
[place], [place] [address]   CLINIC:  Medical Oncology/Hematology  PCP:  Sharilyn Sites, [place] [phone]   REASON FOR VISIT:  Follow-up for B12 deficiency.  CURRENT THERAPY: B12 injections monthly.   INTERVAL HISTORY: Seen for follow-up of B12 deficiency.  He is continuing to take B12 injections monthly.  Denies any bleeding per rectum or melena.  Denies any fevers, night sweats or weight loss.  No tingling or numbness in extremities noted.  No recurrent infections.   REVIEW OF SYSTEMS:  Review of Systems  All other systems reviewed and are negative.    PAST MEDICAL/SURGICAL HISTORY:  Past Medical History:  Diagnosis Date  . Anemia 11/28/2011  . B12 deficiency 11/29/2011  . CAD in native artery   . Colon polyps 11/30/2011  . GERD (gastroesophageal reflux disease)   . Leukopenia 11/30/2011  . Pancytopenia ([place]) 11/28/2011  . Peptic ulcer 1971  . Unintentional weight loss 11/28/2011   Past Surgical History:  Procedure Laterality Date  . APPENDECTOMY  1970  . CARDIAC CATHETERIZATION  1996   s/p PTCA/stent (Dr Lia Foyer)  . COLON RESECTION  01/15/2012   Procedure: HAND ASSISTED LAPAROSCOPIC COLON RESECTION;  Surgeon: Jamesetta So, MD;  Location: [facility];  Service: General;  Laterality: N/A;  partial colectomy  . COLONOSCOPY  12/30/2011   RMR: Multiple colonic polyps-status post multiple snare polypectomies and polyp debulking. 1 small rectal polyp removed as described above . Sigmoid and rectum looked good endoscopically.   . ESOPHAGOGASTRODUODENOSCOPY  11/29/2014   RMR: Normal esophagus. Status post prior gastric surgery as described above. Abnoraml proximal small bowel status post biopsy. Status psot gastric biopsy.   Marland Kitchen PARTIAL COLECTOMY  01/15/2012   Procedure: PARTIAL COLECTOMY;  Surgeon: Jamesetta So, MD;  Location: [facility];  Service: General;  Laterality: N/A;  . SEPTOPLASTY    .  STOMACH SURGERY  1971   Hx PUD      SOCIAL HISTORY:  Social History   Socioeconomic History  . Marital status: Widowed    Spouse name: Not on file  . Number of children: 2  . Years of education: Not on file  . Highest education level: Not on file  Occupational History  . Occupation: Psychiatric nurse: [employer]  Tobacco Use  . Smoking status: Former Smoker    Packs/day: 0.50    Years: 45.00    Pack years: 22.50    Types: Cigarettes    Quit date: 11/27/1996    Years since quitting: 22.3  . Smokeless tobacco: Never Used  Substance and Sexual Activity  . Alcohol use: No  . Drug use: No  . Sexual activity: Yes  Other Topics Concern  . Not on file  Social History Narrative  . Not on file   Social Determinants of Health   Financial Resource Strain:   . Difficulty of Paying Living Expenses: Not on file  Food Insecurity:   . Worried About Charity fundraiser in the Last Year: Not on file  . Ran Out of Food in the Last Year: Not on file  Transportation Needs:   . Lack of Transportation (Medical): Not on file  . Lack of Transportation (Non-Medical): Not on file  Physical Activity:   . Days of Exercise per Week: Not on file  . Minutes of Exercise per Session: Not on file  Stress:   . Feeling of Stress :  Not on file  Social Connections:   . Frequency of Communication with Friends and Family: Not on file  . Frequency of Social Gatherings with Friends and Family: Not on file  . Attends Religious Services: Not on file  . Active Member of Clubs or Organizations: Not on file  . Attends Archivist Meetings: Not on file  . Marital Status: Not on file  Intimate Partner Violence:   . Fear of Current or Ex-Partner: Not on file  . Emotionally Abused: Not on file  . Physically Abused: Not on file  . Sexually Abused: Not on file    FAMILY HISTORY:  Family History  Problem Relation Age of Onset  . Heart failure Brother      CURRENT MEDICATIONS:  Outpatient Encounter Medications as of 03/25/2019  Medication Sig  . aspirin 81 MG tablet Take 81 mg by mouth daily.  . cyanocobalamin (,VITAMIN B-12,) 1000 MCG/ML injection INJECT 1 ML INTRAMUSCULARLY ONCE MONTHLY.  Marland Kitchen ibuprofen (ADVIL,MOTRIN) 200 MG tablet Take 200 mg by mouth every 6 (six) hours as needed. Used rarely for pain   No facility-administered encounter medications on file as of 03/25/2019.    ALLERGIES:  No Known Allergies   PHYSICAL EXAM:  ECOG Performance status: 1  Vitals:   03/25/19 1550  BP: (!) 185/83  Pulse: 76  Resp: 18  Temp: (!) 97.1 F (36.2 C)  SpO2: 99%   Filed Weights   03/25/19 1550  Weight: 173 lb 3.2 oz (78.6 kg)    Physical Exam Vitals reviewed.  Constitutional:      Appearance: Normal appearance.  Cardiovascular:     Rate and Rhythm: Normal rate and regular rhythm.     Heart sounds: Normal heart sounds.  Pulmonary:     Effort: Pulmonary effort is normal.     Breath sounds: Normal breath sounds.  Abdominal:     General: There is no distension.     Palpations: Abdomen is soft. There is no mass.  Musculoskeletal:        General: No swelling.  Skin:    General: Skin is warm.  Neurological:     General: No focal deficit present.     Mental Status: He is alert and oriented to person, place, and time.  Psychiatric:        Mood and Affect: Mood normal.        Behavior: Behavior normal.      LABORATORY DATA:  I have reviewed the labs as listed.  CBC    Component Value Date/Time   WBC 7.2 03/23/2019 1500   RBC 5.33 03/23/2019 1500   HGB 15.8 03/23/2019 1500   HCT 46.9 03/23/2019 1500   HCT 15 11/19/2011 0827   PLT 161 03/23/2019 1500   MCV 88.0 03/23/2019 1500   MCH 29.6 03/23/2019 1500   MCHC 33.7 03/23/2019 1500   RDW 13.5 03/23/2019 1500   LYMPHSABS 2.9 03/23/2019 1500   MONOABS 0.5 03/23/2019 1500   EOSABS 0.1 03/23/2019 1500   BASOSABS 0.0 03/23/2019 1500   CMP Latest Ref Rng & Units  03/23/2019 07/27/2018 08/04/2017  Glucose 70 - 99 mg/dL 107(H) 114(H) 143(H)  BUN 8 - 23 mg/dL 8 8 8   Creatinine 0.61 - 1.24 mg/dL 0.95 0.85 0.99  Sodium 135 - 145 mmol/L 137 138 133(L)  Potassium 3.5 - 5.1 mmol/L 4.0 4.3 4.1  Chloride 98 - 111 mmol/L 104 105 101  CO2 22 - 32 mmol/L 25 27 23   Calcium 8.9 - 10.3  mg/dL 9.0 8.9 9.1  Total Protein 6.5 - 8.1 g/dL 6.8 6.8 7.1  Total Bilirubin 0.3 - 1.2 mg/dL 0.8 0.9 1.0  Alkaline Phos 38 - 126 U/L 95 88 90  AST 15 - 41 U/L 15 15 20   ALT 0 - 44 U/L 12 9 12(L)    PENDING LABS:    DIAGNOSTIC IMAGING:  I have personally reviewed his prior CT scan.   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  He is receiving B12 injections monthly. -Blood work from 03/23/2019 shows hemoglobin 15.8. -B12 level was 413. -He will continue monthly B12 injections.  2.  Smoking history: -He has a 30+ pack year smoking history. -We have reviewed his low-dose screening lung CT scan from 03/24/2019 which shows lung RADS 1. -I will see him back in 1 year with repeat CT scan.        Orders placed this encounter:  Orders Placed This Encounter  Procedures  . CBC with Differential  . Comprehensive metabolic panel  . Vitamin B12      Derek Jack, [place] [phone]

## 2019-03-25 NOTE — Assessment & Plan Note (Signed)
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  He is receiving B12 injections monthly. -Blood work from 03/23/2019 shows hemoglobin 15.8. -B12 level was 413. -He will continue monthly B12 injections.  2.  Smoking history: -He has a 30+ pack year smoking history. -We have reviewed his low-dose screening lung CT scan from 03/24/2019 which shows lung RADS 1. -I will see him back in 1 year with repeat CT scan.

## 2019-03-25 NOTE — Patient Instructions (Signed)
[place] at [facility] Discharge Instructions  You were seen today by Dr. Delton Coombes. He went over your recent lab and scan results. He will see you back in 1 year for labs, low dose CT and follow up.   Thank you for choosing [place] at [facility] to provide your oncology and hematology care.  To afford each patient quality time with our provider, please arrive at least 15 minutes before your scheduled appointment time.   If you have a lab appointment with the [facility] please come in thru the  Main Entrance and check in at the main information desk  You need to re-schedule your appointment should you arrive 10 or more minutes late.  We strive to give you quality time with our providers, and arriving late affects you and other patients whose appointments are after yours.  Also, if you no show three or more times for appointments you may be dismissed from the clinic at the providers discretion.     Again, thank you for choosing [facility].  Our hope is that these requests will decrease the amount of time that you wait before being seen by our physicians.       _____________________________________________________________  Should you have questions after your visit to [facility], please contact our office at [phone] between the hours of 8:00 a.m. and 4:30 p.m.  Voicemails left after 4:00 p.m. will not be returned until the following business day.  For prescription refill requests, have your pharmacy contact our office and allow 72 hours.    Cancer Center Support Programs:   > Cancer Support Group  2nd Tuesday of the month 1pm-2pm, Journey Room

## 2019-05-16 DIAGNOSIS — I639 Cerebral infarction, unspecified: Secondary | ICD-10-CM

## 2019-05-16 HISTORY — DX: Cerebral infarction, unspecified: I63.9

## 2020-01-21 DIAGNOSIS — D510 Vitamin B12 deficiency anemia due to intrinsic factor deficiency: Secondary | ICD-10-CM | POA: Diagnosis not present

## 2020-01-21 DIAGNOSIS — E782 Mixed hyperlipidemia: Secondary | ICD-10-CM | POA: Diagnosis not present

## 2020-01-21 DIAGNOSIS — Z0000 Encounter for general adult medical examination without abnormal findings: Secondary | ICD-10-CM | POA: Diagnosis not present

## 2020-01-21 DIAGNOSIS — Z23 Encounter for immunization: Secondary | ICD-10-CM | POA: Diagnosis not present

## 2020-01-21 DIAGNOSIS — F17200 Nicotine dependence, unspecified, uncomplicated: Secondary | ICD-10-CM | POA: Diagnosis not present

## 2020-01-21 DIAGNOSIS — Z1331 Encounter for screening for depression: Secondary | ICD-10-CM | POA: Diagnosis not present

## 2020-01-21 DIAGNOSIS — Z6827 Body mass index (BMI) 27.0-27.9, adult: Secondary | ICD-10-CM | POA: Diagnosis not present

## 2020-01-21 DIAGNOSIS — Z1389 Encounter for screening for other disorder: Secondary | ICD-10-CM | POA: Diagnosis not present

## 2020-01-21 DIAGNOSIS — Z125 Encounter for screening for malignant neoplasm of prostate: Secondary | ICD-10-CM | POA: Diagnosis not present

## 2020-02-18 DIAGNOSIS — H5200 Hypermetropia, unspecified eye: Secondary | ICD-10-CM | POA: Diagnosis not present

## 2020-02-18 DIAGNOSIS — Z0100 Encounter for examination of eyes and vision without abnormal findings: Secondary | ICD-10-CM | POA: Diagnosis not present

## 2020-02-28 ENCOUNTER — Other Ambulatory Visit (HOSPITAL_COMMUNITY): Payer: Self-pay

## 2020-02-28 DIAGNOSIS — Z122 Encounter for screening for malignant neoplasm of respiratory organs: Secondary | ICD-10-CM

## 2020-02-28 DIAGNOSIS — Z87891 Personal history of nicotine dependence: Secondary | ICD-10-CM

## 2020-02-28 NOTE — Progress Notes (Signed)
Orders placed for LDCT. I have called the patient for follow-up. Unable to reach the patient at this time. I left a detailed VM asking that the patient call me back to schedule.

## 2020-03-27 ENCOUNTER — Encounter (HOSPITAL_COMMUNITY): Payer: Self-pay

## 2020-03-27 NOTE — Progress Notes (Signed)
I have attempted to reach the patient regarding follow-up LDCT. Unable to reach the patient at this time. I have left a detailed VM asking that the patient call me back to reschedule.

## 2020-03-29 ENCOUNTER — Other Ambulatory Visit (HOSPITAL_COMMUNITY): Payer: Medicare HMO

## 2020-03-30 ENCOUNTER — Ambulatory Visit (HOSPITAL_COMMUNITY): Payer: Medicare HMO | Admitting: Hematology

## 2020-04-13 ENCOUNTER — Other Ambulatory Visit: Payer: Self-pay | Admitting: Family Medicine

## 2020-04-13 ENCOUNTER — Other Ambulatory Visit (HOSPITAL_COMMUNITY): Payer: Self-pay | Admitting: Family Medicine

## 2020-04-13 ENCOUNTER — Encounter (HOSPITAL_COMMUNITY): Payer: Self-pay

## 2020-04-13 DIAGNOSIS — F17290 Nicotine dependence, other tobacco product, uncomplicated: Secondary | ICD-10-CM

## 2020-04-13 NOTE — Progress Notes (Signed)
Call placed to patient for follow-up LDCT. Unable to reach patient. Detailed VM left asking that the patient call me back to reschedule.

## 2020-05-11 ENCOUNTER — Other Ambulatory Visit (HOSPITAL_COMMUNITY): Payer: Self-pay

## 2020-05-12 ENCOUNTER — Other Ambulatory Visit: Payer: Self-pay

## 2020-05-12 ENCOUNTER — Observation Stay (HOSPITAL_COMMUNITY)
Admission: EM | Admit: 2020-05-12 | Discharge: 2020-05-13 | Disposition: A | Discharge status: Home / Self Care | Payer: Medicare HMO | Attending: Internal Medicine | Admitting: Internal Medicine

## 2020-05-12 ENCOUNTER — Emergency Department (HOSPITAL_COMMUNITY): Payer: Medicare HMO

## 2020-05-12 ENCOUNTER — Encounter (HOSPITAL_COMMUNITY): Payer: Self-pay | Admitting: *Deleted

## 2020-05-12 DIAGNOSIS — I639 Cerebral infarction, unspecified: Secondary | ICD-10-CM | POA: Diagnosis not present

## 2020-05-12 DIAGNOSIS — I6622 Occlusion and stenosis of left posterior cerebral artery: Secondary | ICD-10-CM | POA: Diagnosis not present

## 2020-05-12 DIAGNOSIS — E538 Deficiency of other specified B group vitamins: Secondary | ICD-10-CM | POA: Diagnosis not present

## 2020-05-12 DIAGNOSIS — F17210 Nicotine dependence, cigarettes, uncomplicated: Secondary | ICD-10-CM | POA: Diagnosis not present

## 2020-05-12 DIAGNOSIS — Z7982 Long term (current) use of aspirin: Secondary | ICD-10-CM | POA: Insufficient documentation

## 2020-05-12 DIAGNOSIS — I2510 Atherosclerotic heart disease of native coronary artery without angina pectoris: Secondary | ICD-10-CM | POA: Insufficient documentation

## 2020-05-12 DIAGNOSIS — I6502 Occlusion and stenosis of left vertebral artery: Secondary | ICD-10-CM | POA: Insufficient documentation

## 2020-05-12 DIAGNOSIS — Z79899 Other long term (current) drug therapy: Secondary | ICD-10-CM | POA: Insufficient documentation

## 2020-05-12 DIAGNOSIS — R9431 Abnormal electrocardiogram [ECG] [EKG]: Secondary | ICD-10-CM | POA: Diagnosis not present

## 2020-05-12 DIAGNOSIS — R531 Weakness: Secondary | ICD-10-CM | POA: Diagnosis present

## 2020-05-12 DIAGNOSIS — R200 Anesthesia of skin: Secondary | ICD-10-CM | POA: Diagnosis not present

## 2020-05-12 DIAGNOSIS — Z20822 Contact with and (suspected) exposure to covid-19: Secondary | ICD-10-CM | POA: Diagnosis not present

## 2020-05-12 DIAGNOSIS — I6602 Occlusion and stenosis of left middle cerebral artery: Secondary | ICD-10-CM | POA: Diagnosis not present

## 2020-05-12 LAB — COMPREHENSIVE METABOLIC PANEL
ALT: 12 U/L (ref 0–44)
AST: 15 U/L (ref 15–41)
Albumin: 4 g/dL (ref 3.5–5.0)
Alkaline Phosphatase: 91 U/L (ref 38–126)
Anion gap: 8 (ref 5–15)
BUN: 9 mg/dL (ref 8–23)
CO2: 28 mmol/L (ref 22–32)
Calcium: 9.9 mg/dL (ref 8.9–10.3)
Chloride: 104 mmol/L (ref 98–111)
Creatinine, Ser: 1.06 mg/dL (ref 0.61–1.24)
GFR, Estimated: >60 mL/min (ref >60)
Glucose, Bld: 109 mg/dL — ABNORMAL HIGH (ref 70–99)
Potassium: 5 mmol/L (ref 3.5–5.1)
Sodium: 140 mmol/L (ref 135–145)
Total Bilirubin: 1 mg/dL (ref 0.3–1.2)
Total Protein: 7.7 g/dL (ref 6.5–8.1)

## 2020-05-12 LAB — I-STAT CHEM 8, ED
BUN: 10 mg/dL (ref 8–23)
Calcium, Ion: 1.19 mmol/L (ref 1.15–1.40)
Chloride: 104 mmol/L (ref 98–111)
Creatinine, Ser: 0.9 mg/dL (ref 0.61–1.24)
Glucose, Bld: 103 mg/dL — ABNORMAL HIGH (ref 70–99)
HCT: 51 % (ref 39.0–52.0)
Hemoglobin: 17.3 g/dL — ABNORMAL HIGH (ref 13.0–17.0)
Potassium: 4.5 mmol/L (ref 3.5–5.1)
Sodium: 139 mmol/L (ref 135–145)
TCO2: 28 mmol/L (ref 22–32)

## 2020-05-12 LAB — DIFFERENTIAL
Abs Immature Granulocytes: 0.03 10*3/uL (ref 0.00–0.07)
Basophils Absolute: 0.1 10*3/uL (ref 0.0–0.1)
Basophils Relative: 1 %
Eosinophils Absolute: 0 10*3/uL (ref 0.0–0.5)
Eosinophils Relative: 0 %
Immature Granulocytes: 0 %
Lymphocytes Relative: 19 %
Lymphs Abs: 1.9 10*3/uL (ref 0.7–4.0)
Monocytes Absolute: 0.6 10*3/uL (ref 0.1–1.0)
Monocytes Relative: 6 %
Neutro Abs: 7.3 10*3/uL (ref 1.7–7.7)
Neutrophils Relative %: 74 %

## 2020-05-12 LAB — ETHANOL: Alcohol, Ethyl (B): <10 mg/dL (ref <10)

## 2020-05-12 LAB — CBC
HCT: 51.7 % (ref 39.0–52.0)
Hemoglobin: 17.4 g/dL — ABNORMAL HIGH (ref 13.0–17.0)
MCH: 30 pg (ref 26.0–34.0)
MCHC: 33.7 g/dL (ref 30.0–36.0)
MCV: 89.1 fL (ref 80.0–100.0)
Platelets: 175 10*3/uL (ref 150–400)
RBC: 5.8 MIL/uL (ref 4.22–5.81)
RDW: 13.6 % (ref 11.5–15.5)
WBC: 9.9 10*3/uL (ref 4.0–10.5)
nRBC: 0 % (ref 0.0–0.2)

## 2020-05-12 LAB — URINALYSIS, ROUTINE W REFLEX MICROSCOPIC
Bacteria, UA: NONE SEEN
Bilirubin Urine: NEGATIVE
Glucose, UA: NEGATIVE mg/dL
Ketones, ur: NEGATIVE mg/dL
Leukocytes,Ua: NEGATIVE
Nitrite: NEGATIVE
Protein, ur: NEGATIVE mg/dL
Specific Gravity, Urine: 1.01 (ref 1.005–1.030)
pH: 6 (ref 5.0–8.0)

## 2020-05-12 LAB — RESP PANEL BY RT-PCR (FLU A&B, COVID) ARPGX2
Influenza A by PCR: NEGATIVE
Influenza B by PCR: NEGATIVE
SARS Coronavirus 2 by RT PCR: NEGATIVE

## 2020-05-12 LAB — PROTIME-INR
INR: 1.1 (ref 0.8–1.2)
Prothrombin Time: 13.5 seconds (ref 11.4–15.2)

## 2020-05-12 LAB — APTT: aPTT: 31 seconds (ref 24–36)

## 2020-05-12 LAB — RAPID URINE DRUG SCREEN, HOSP PERFORMED
Amphetamines: NOT DETECTED
Barbiturates: NOT DETECTED
Benzodiazepines: NOT DETECTED
Cocaine: NOT DETECTED
Opiates: NOT DETECTED
Tetrahydrocannabinol: NOT DETECTED

## 2020-05-12 IMAGING — MR MR HEAD W/O CM
11 of 12 series · 42 of 48 positions shown · non-contrast
Comparison: Same day head ct

CLINICAL DATA: Stroke follow-up.



[Series 5: DWI · axial · 4.0mm · 0.88mm/px · z∈[-17,+139]mm · 3 of 40 slices shown (1 of 4)]
[im 1/40]
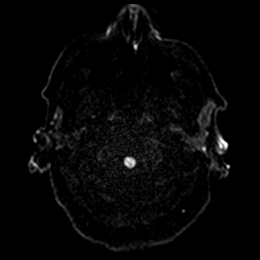
[im 20/40]
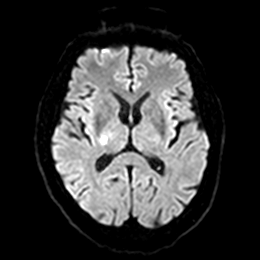
[im 40/40]
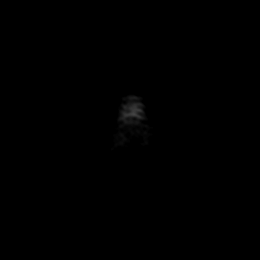

[Series 6: DWI · axial · 4.0mm · 0.88mm/px · z∈[-17,+139]mm · 4 of 40 slices shown (2 of 4)]
[im 1/40]
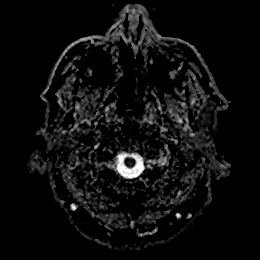
[im 14/40]
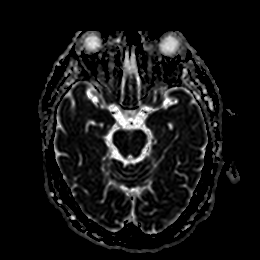
[im 27/40]
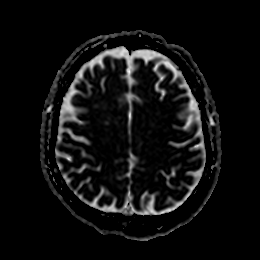
[im 40/40]
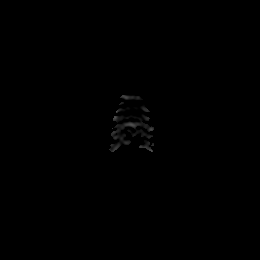

[Series 7: DWI · coronal · 4.0mm · 0.88mm/px · 3 of 32 slices shown (3 of 4)]
[im 1/32]
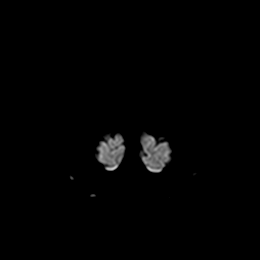
[im 16/32]
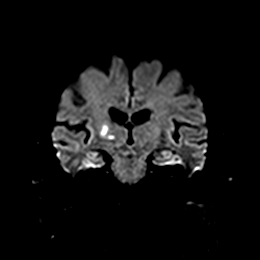
[im 32/32]
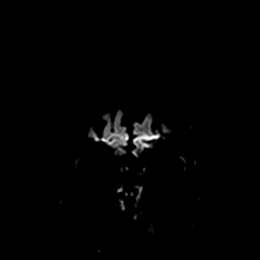

[Series 8: DWI · coronal · 4.0mm · 0.88mm/px · 3 of 32 slices shown (4 of 4)]
[im 1/32]
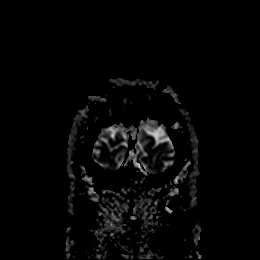
[im 16/32]
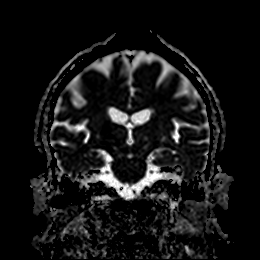
[im 32/32]
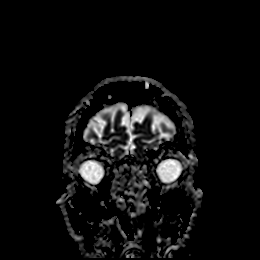

[Series 9: T1 · sagittal · 5.0mm · 0.80mm/px · 2 of 23 slices shown]
[im 1/23]
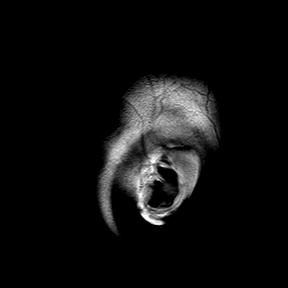
[im 23/23]
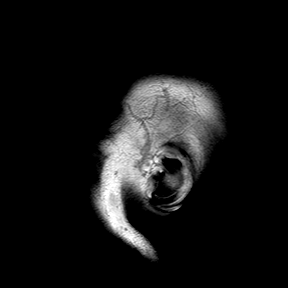

[Series 10: T2 · axial · 5.0mm · 0.72mm/px · z∈[-16,+138]mm · 2 of 23 slices shown (1 of 2)]
[im 1/23]
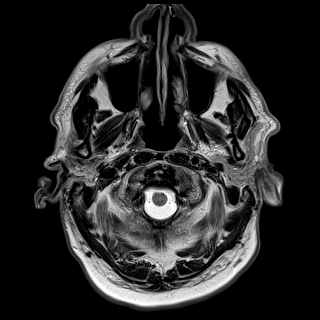
[im 23/23]
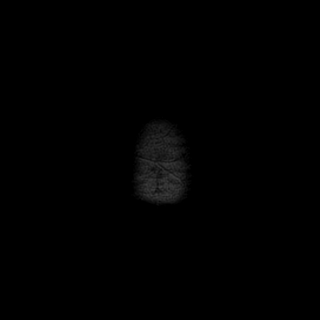

[Series 16: mag_images · axial · 3.0mm · 0.90mm/px · z∈[-27,+150]mm · 6 of 60 slices shown]
[im 1/60]
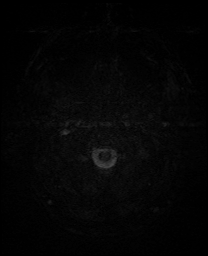
[im 12/60]
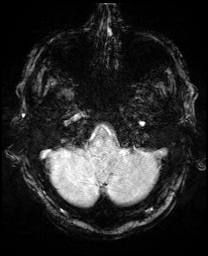
[im 24/60]
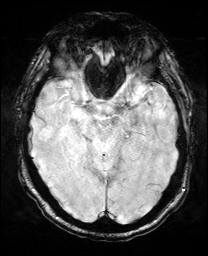
[im 36/60]
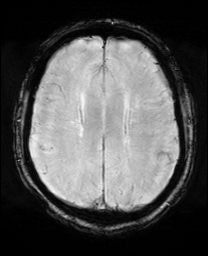
[im 48/60]
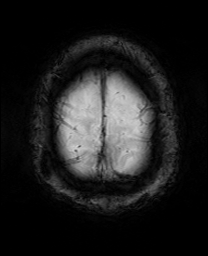
[im 60/60]
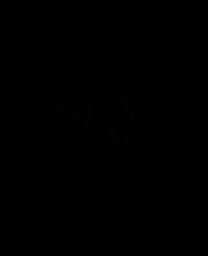

[Series 17: pha_images · axial · 3.0mm · 0.90mm/px · z∈[-27,+150]mm · 6 of 59 slices shown]
[im 1/59]
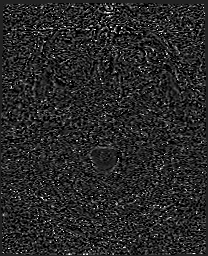
[im 12/59]
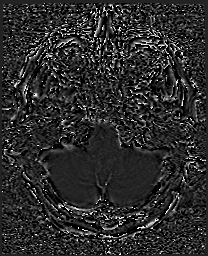
[im 24/59]
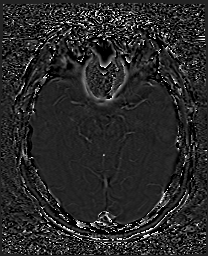
[im 35/59]
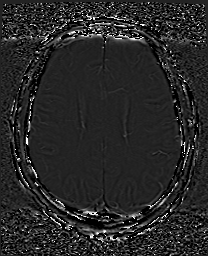
[im 47/59]
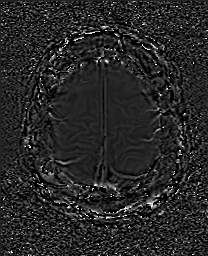
[im 59/59]
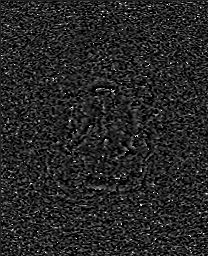

[Series 18: swi_images · axial · 3.0mm · 0.90mm/px · z∈[-27,+150]mm · 6 of 60 slices shown]
[im 1/60]
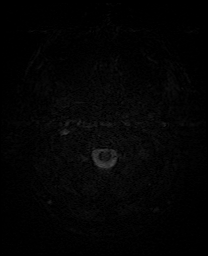
[im 12/60]
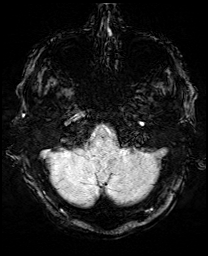
[im 24/60]
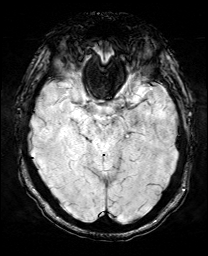
[im 36/60]
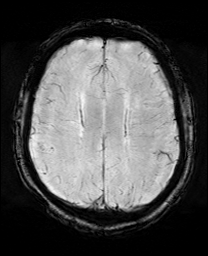
[im 48/60]
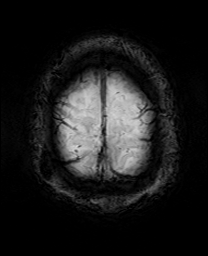
[im 60/60]
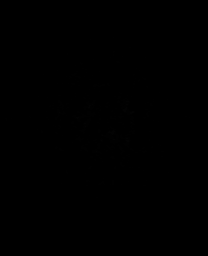

[Series 20: FLAIR · axial · 4.0mm · 0.43mm/px · z∈[-13,+135]mm · 4 of 38 slices shown]
[im 1/38]
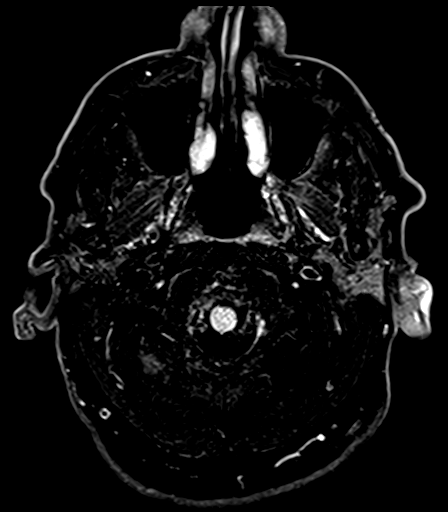
[im 13/38]
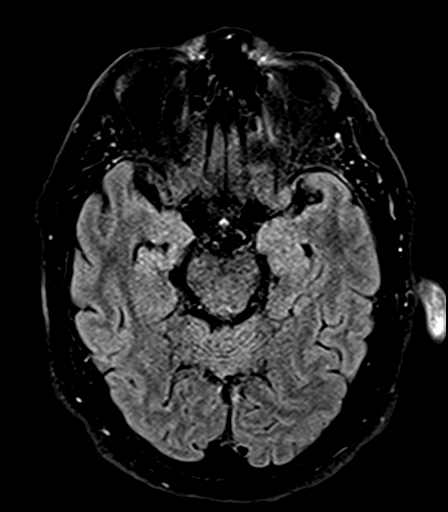
[im 25/38]
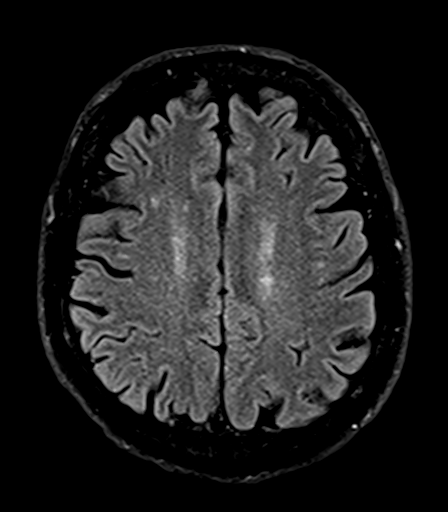
[im 38/38]
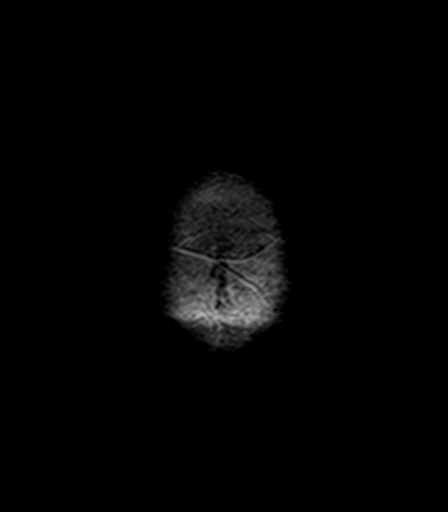

[Series 22: T2 · coronal · 5.0mm · 0.72mm/px · 3 of 28 slices shown (2 of 2)]
[im 1/28]
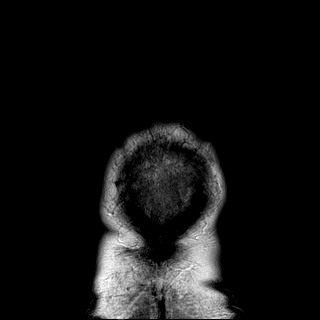
[im 14/28]
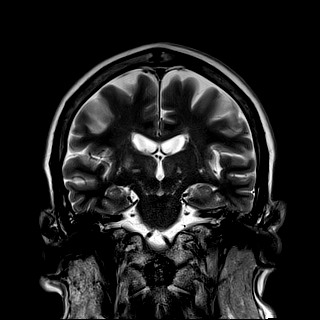
[im 28/28]
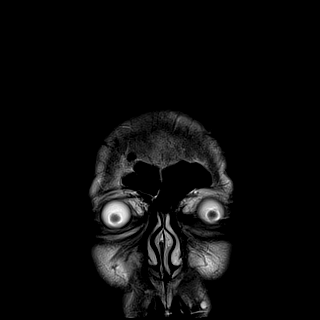

[42 of 48 positions shown; findings below may reference images not displayed]

FINDINGS: MRI HEAD FINDINGS

Brain: Multiple small acute right thalamocapsular infarcts.
Associated edema without mass effect. No acute hemorrhage. No mass
lesion. No extra-axial fluid collection. No hydrocephalus. Mild for
age scattered T2/FLAIR hyperintensities within the white matter,
likely related to chronic microvascular ischemic disease. Prior
small lacunar infarcts in the left thalamus. No midline shift. Basal
cisterns are patent.

Skull and upper cervical spine: Normal marrow signal.

Sinuses/Orbits: Sinuses are largely clear.  Unremarkable orbits.

Other: Moderate bilateral mastoid effusions.

MRA HEAD FINDINGS

Anterior circulation: No evidence of large vessel occlusion.
Mild-to-moderate stenosis of the left M1 MCA with approximately 40%
narrowing. There is a small (2.5 mm) anterosuperiorly directed
outpouching of the right paraclinoid ICA (series 100, image 116),
suspicious for aneurysm.

Posterior circulation: Intradural vertebral arteries and basilar
artery are patent without evidence of significant stenosis severe
stenosis of the right P2 PCA (series 100, image 125). Moderate to
severe stenosis of the left P2 PCA (series 100, image 138. There is
flow related signal in the more distal PCAs bilaterally.

MRA NECK FINDINGS

Carotids: There is bilateral carotid bifurcation narrowing likely
related to atherosclerosis without evidence of greater than 50%
narrowing.

Vertebral arteries: Codominant. Suspected mild to moderate left
vertebral artery origin stenosis, although evaluation is limited
proximally. Otherwise, the vertebral arteries are patent without
hemodynamically significant stenosis.
IMPRESSION: MRI head:

1. Multiple small acute RIGHT thalamocapsular infarcts. Associated
edema without mass effect.
2. Small remote lacunar infarct in the LEFT thalamus and mild for
age chronic microvascular ischemic disease.

MRA:

1. Severe right and moderate to severe left P2 PCA stenosis, as
detailed above.
2. Mild to moderate stenosis of the proximal left M1 MCA with
approximately 40% narrowing.
3. Suspected 2.5 mm right paraclinoid ICA aneurysm

MRA neck

1. Possible mild to moderate stenosis of the left vertebral artery
origin, although evaluation is limited proximally in the neck.
2. Otherwise, no evidence of hemodynamically significant stenosis in
the neck.

Findings discussed with Dr. ZHI Via telephone at [DATE].

## 2020-05-12 IMAGING — CT CT HEAD W/O CM
3 series · 15 of 47 positions shown, 18 images · non-contrast
Comparison: None.

CLINICAL DATA: Left arm and leg numbness

EXAM:
CT HEAD WITHOUT CONTRAST
TECHNIQUE: Contiguous axial images were obtained from the base of the skull
through the vertex without intravenous contrast.

[Series 2: head w o · axial · 0.48mm/px · z∈[-7,+143]mm · 9 of 36 slices shown, 12 images]
[im 3/36  brain]
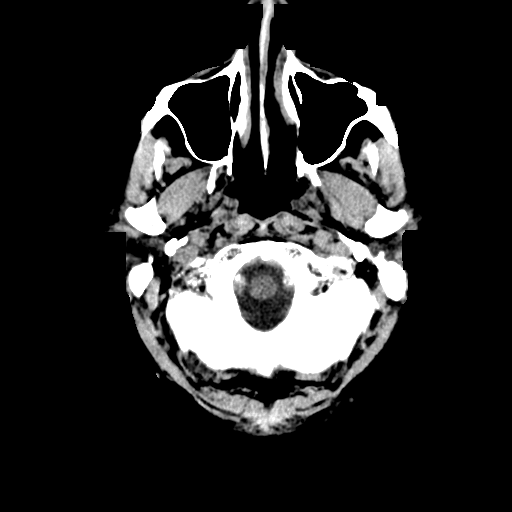
[im 3/36  bone]
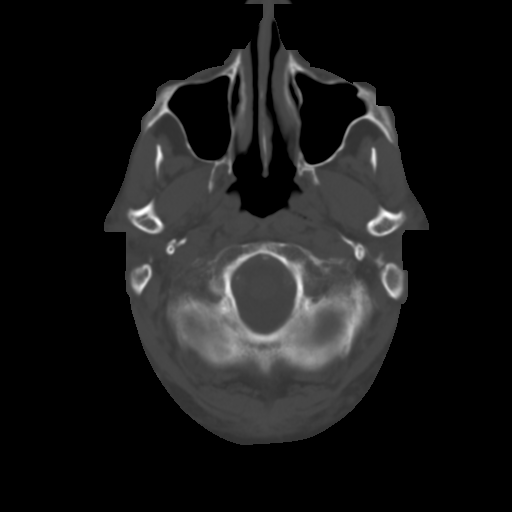
[im 7/36  brain]
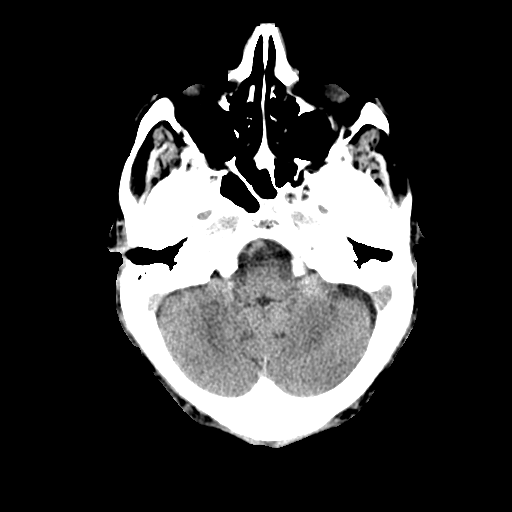
[im 10/36  brain]
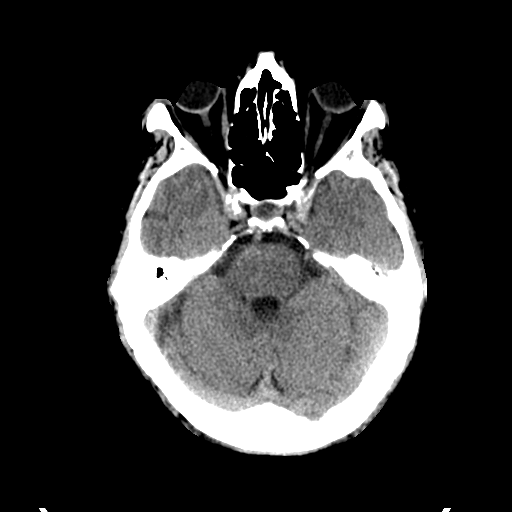
[im 14/36  brain]
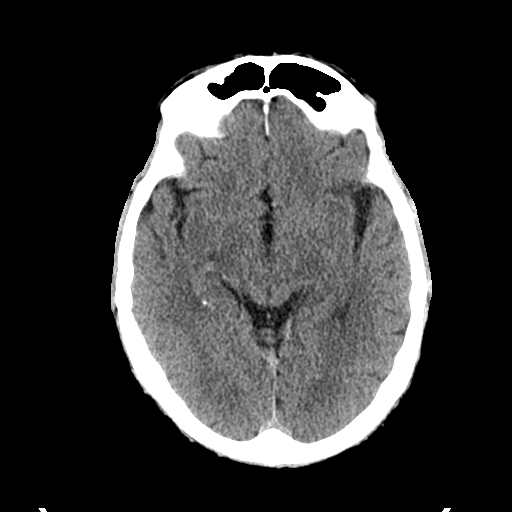
[im 19/36  brain]
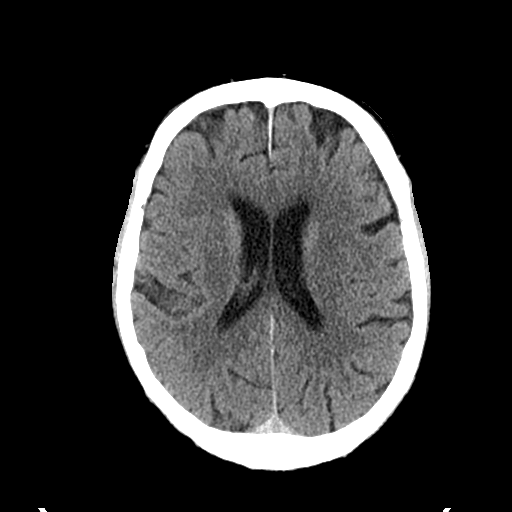
[im 19/36  bone]
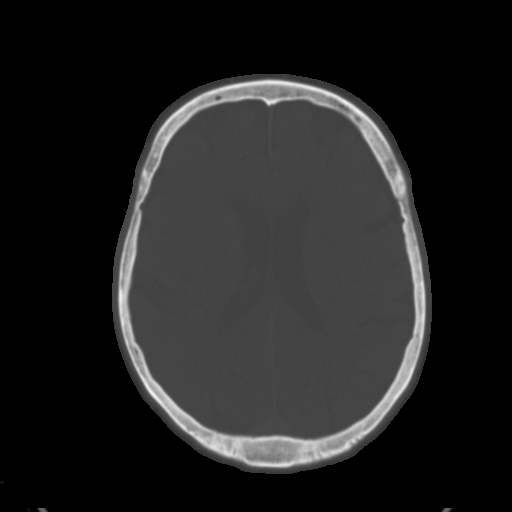
[im 22/36  brain]
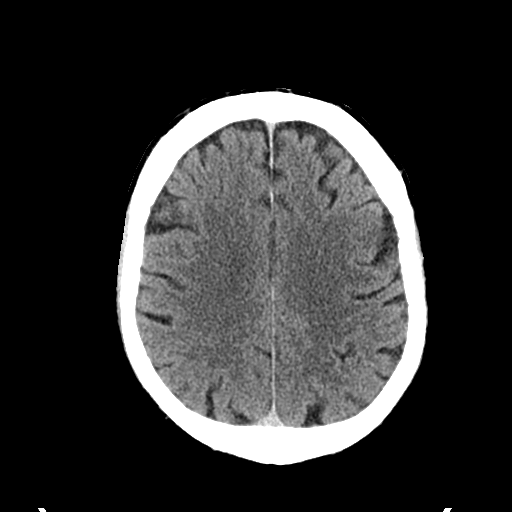
[im 26/36  brain]
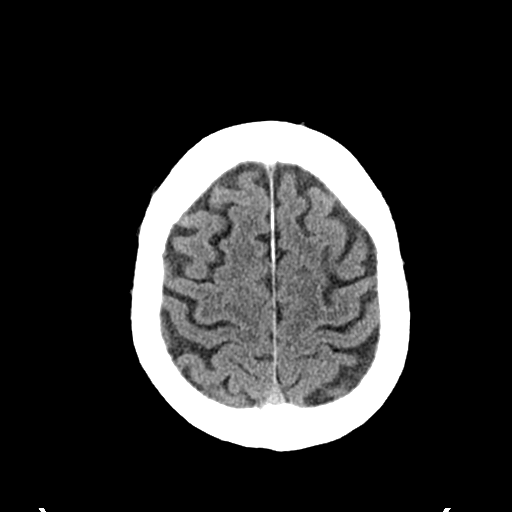
[im 29/36  brain]
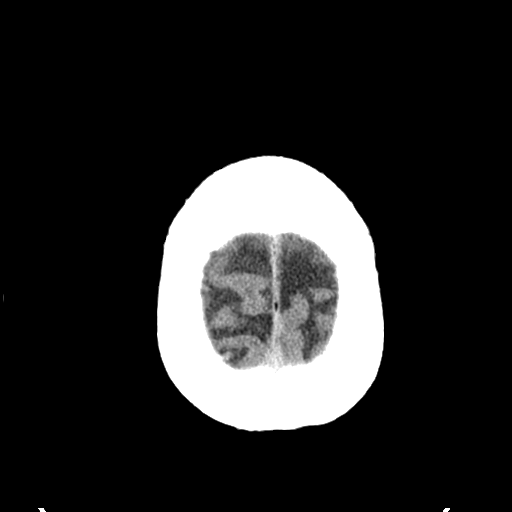
[im 33/36  brain]
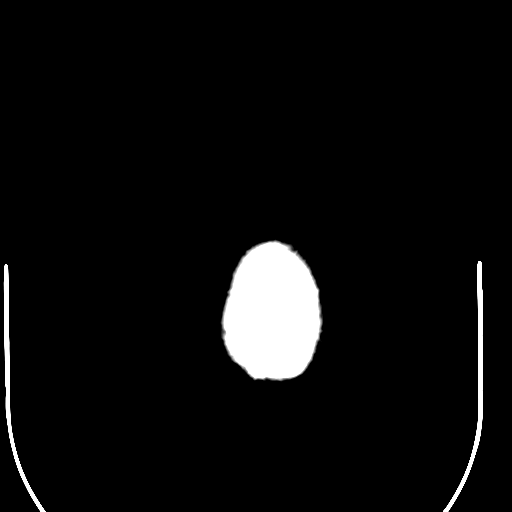
[im 33/36  bone]
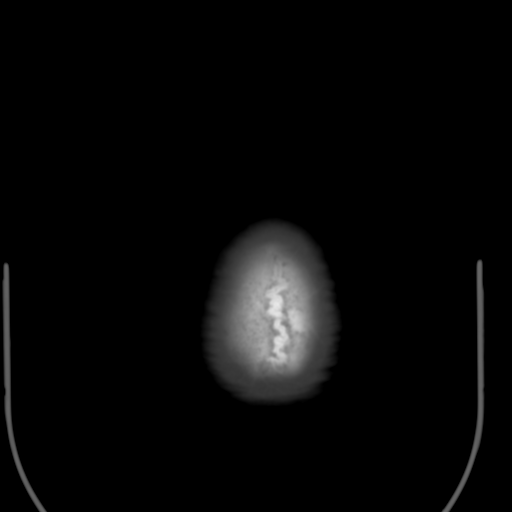

[Series 4: coronal soft · coronal · 0.32mm/px · 3 of 73 slices shown]
[im 25/73  brain]
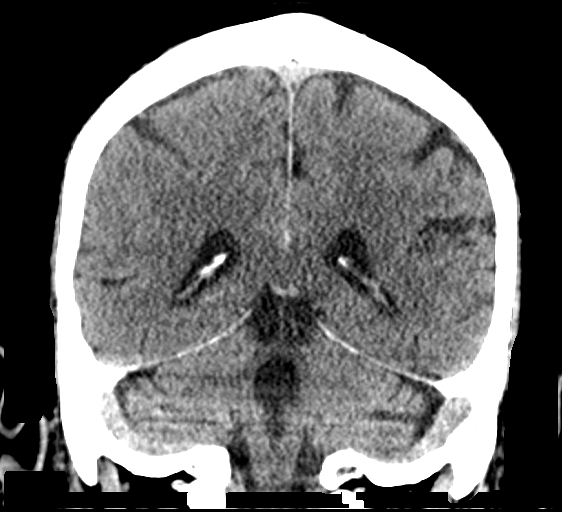
[im 33/73  brain]
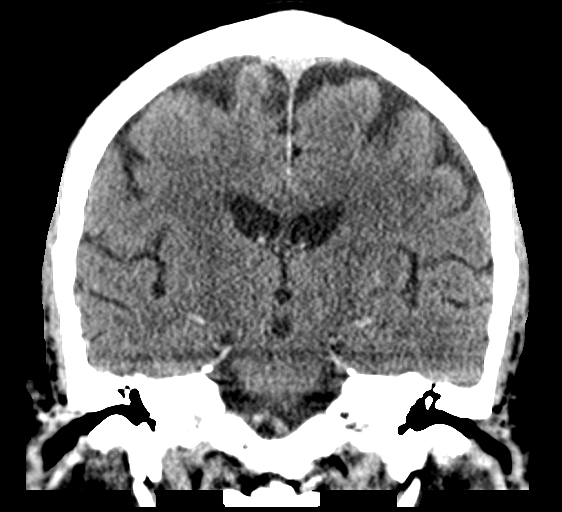
[im 41/73  brain]
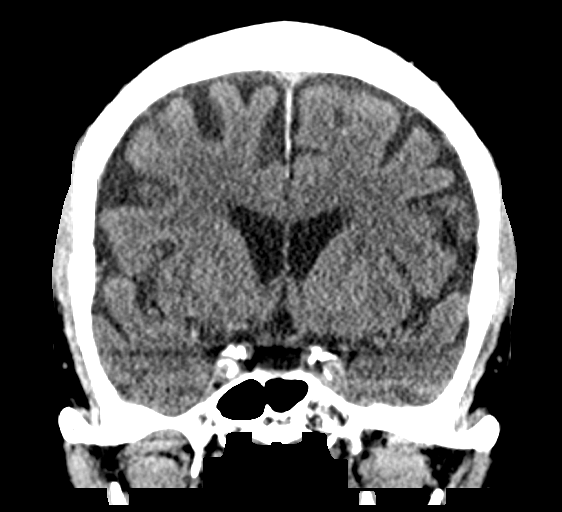

[Series 5: sagittal soft · sagittal · 0.32mm/px · 3 of 61 slices shown]
[im 21/61  brain]
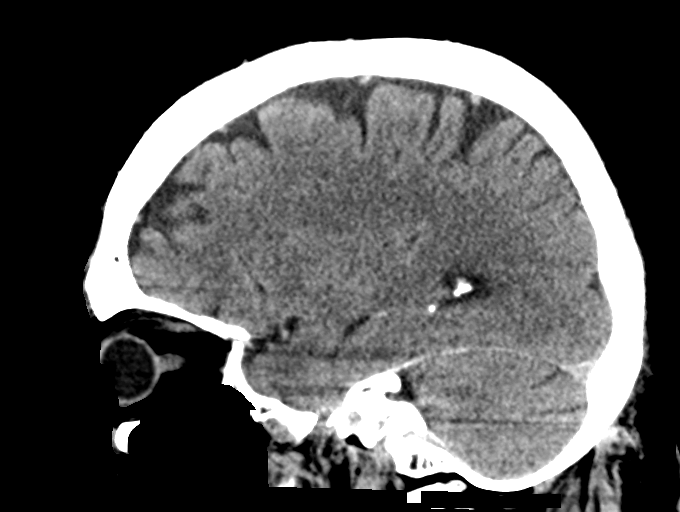
[im 31/61  brain]
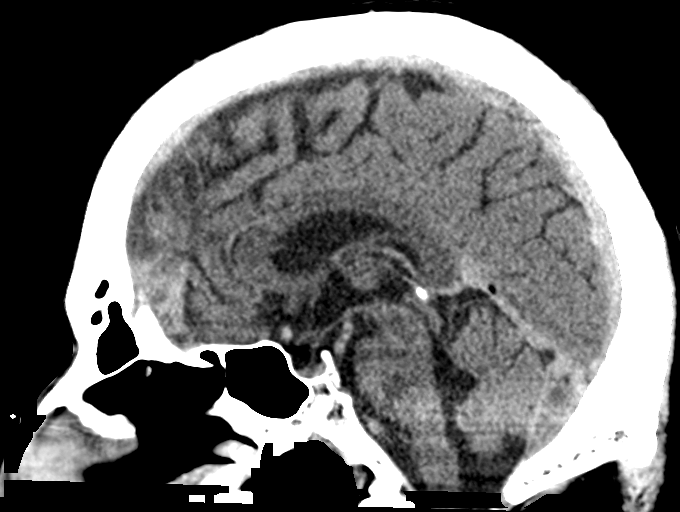
[im 41/61  brain]
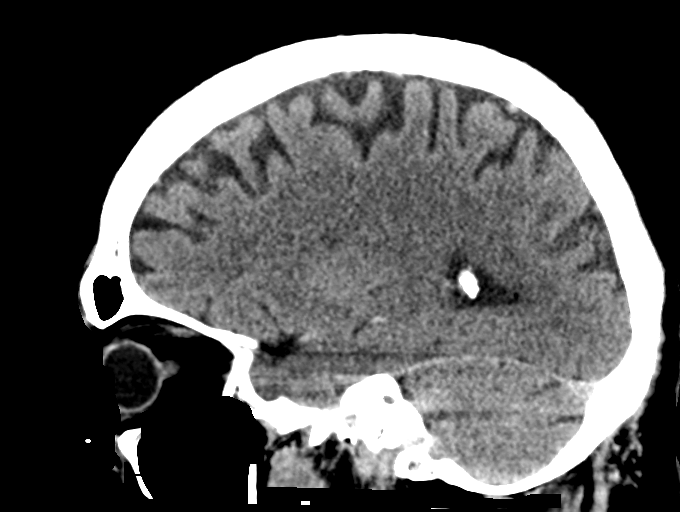

[15 of 47 positions shown; findings below may reference images not displayed]

FINDINGS: Brain: There is no acute intracranial hemorrhage, mass effect, or
edema. Gray-white differentiation is preserved. Age-indeterminate
infarct of the posterior left thalamus. Questionable small hypodense
focus in the lateral right thalamus. There is no extra-axial fluid
collection. Ventricles and sulci are within normal limits in size
and configuration.

Vascular: No hyperdense vessel.There is atherosclerotic
calcification at the skull base.

Skull: Calvarium is unremarkable.

Sinuses/Orbits: No acute finding.

Other: None.
IMPRESSION: No acute intracranial hemorrhage. Age-indeterminate left thalamic
infarct. Questionable additional age-indeterminate right thalamic
infarct.

## 2020-05-12 IMAGING — MR MR MRA HEAD W/O CM
1 series · 18 of 48 positions shown · non-contrast
Comparison: Same day head ct

CLINICAL DATA: Stroke follow-up.



[Series 100: TOF · axial · 0.4mm · 0.43mm/px · z∈[-11,+78]mm · 18 of 232 slices shown]
[im 1/232]
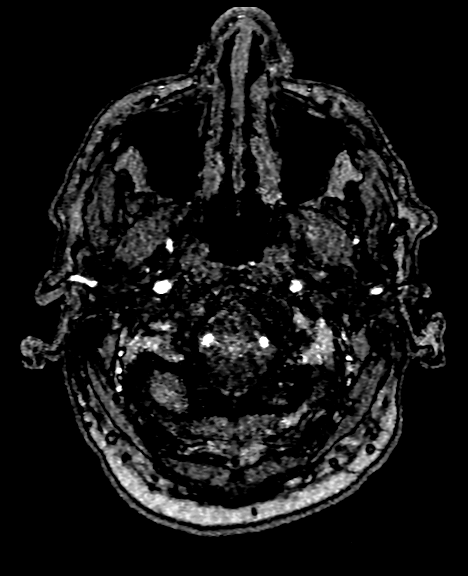
[im 5/232]
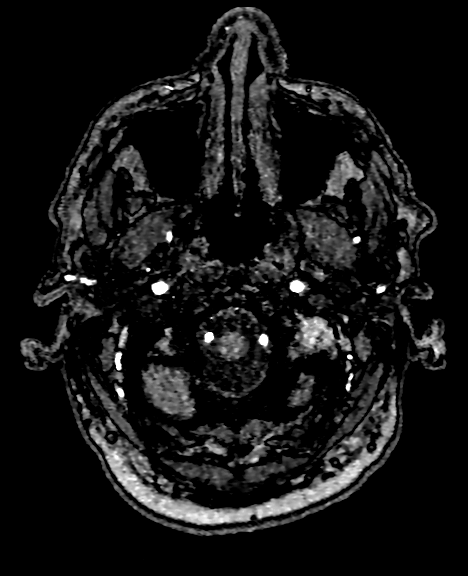
[im 10/232]
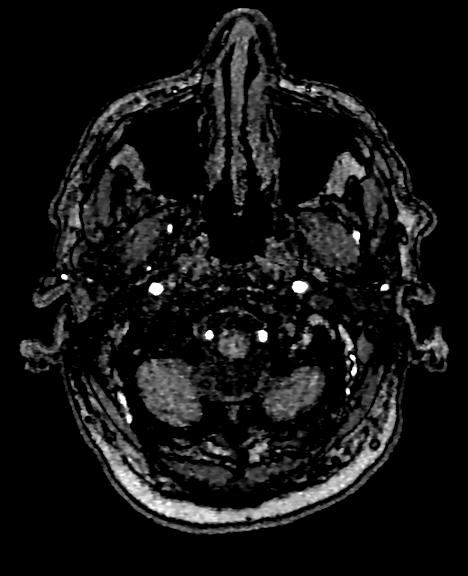
[im 15/232]
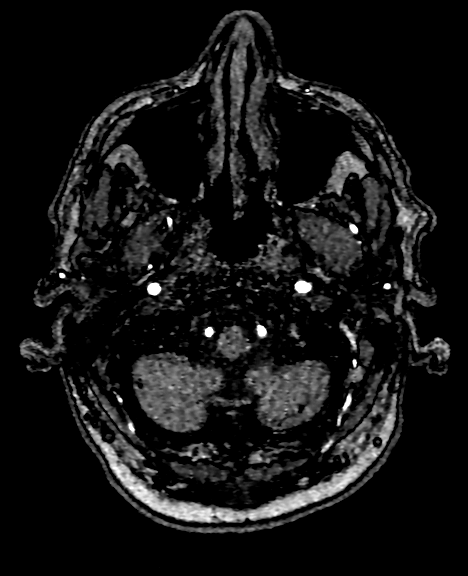
[im 20/232]
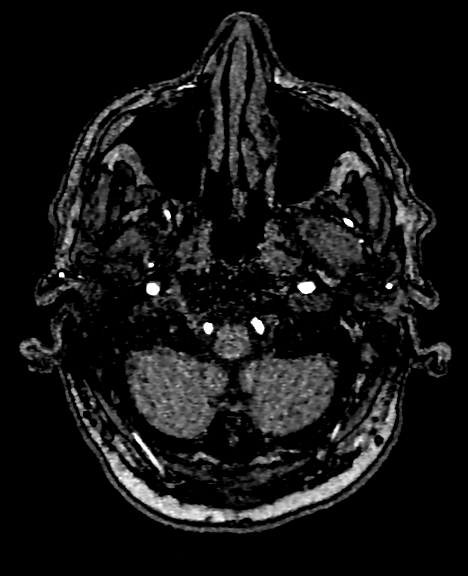
[im 25/232]
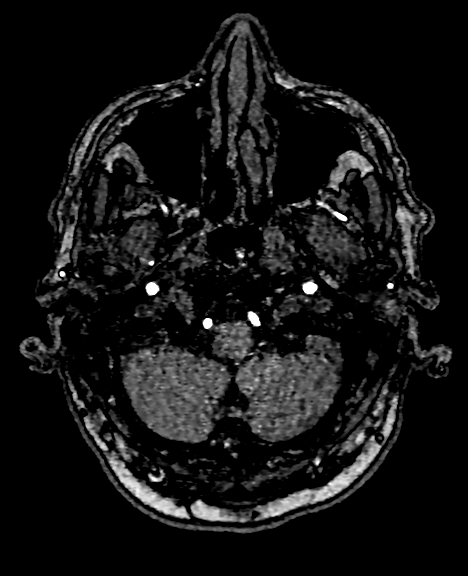
[im 30/232]
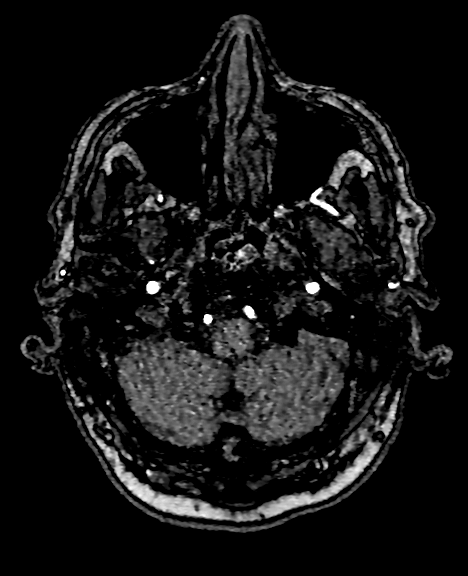
[im 35/232]
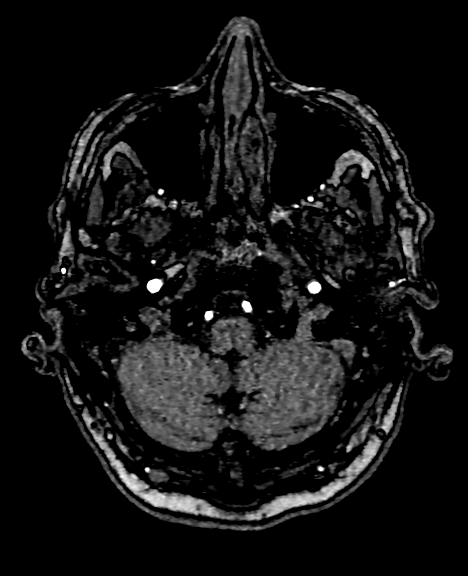
[im 40/232]
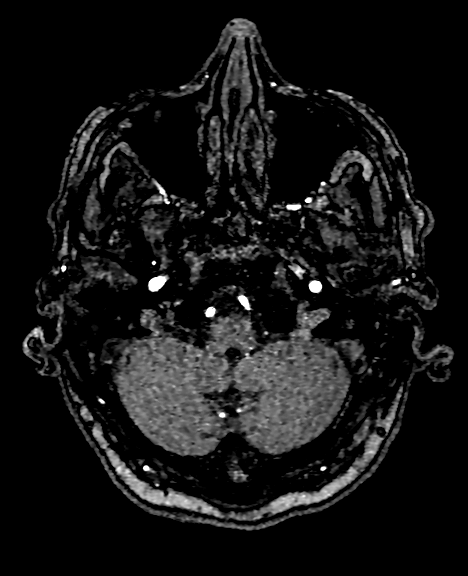
[im 45/232]
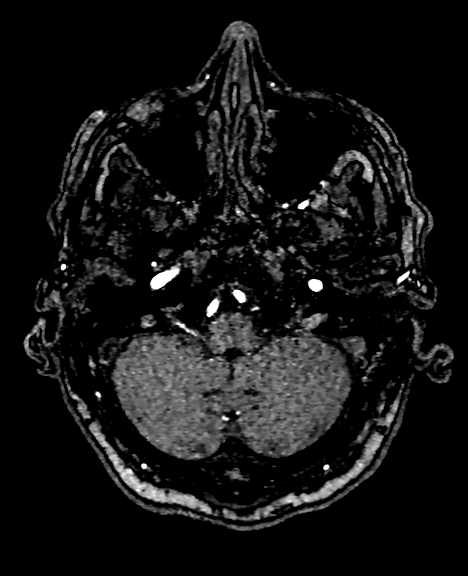
[im 74/232]
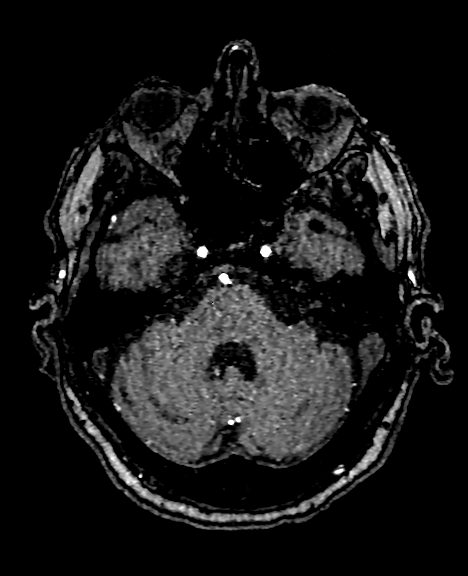
[im 104/232]
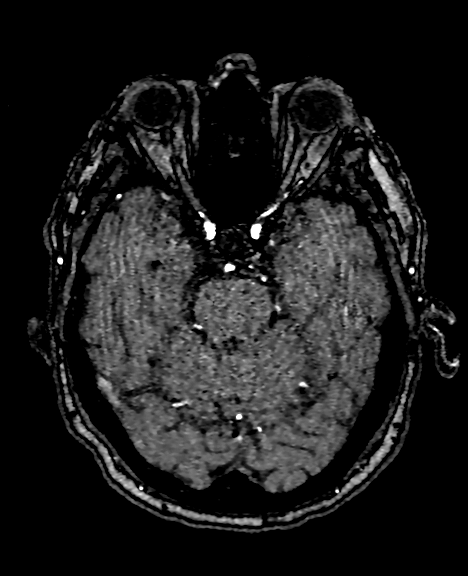
[im 118/232]
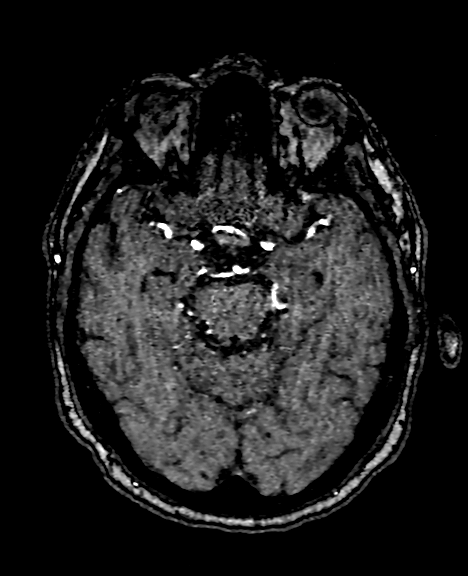
[im 133/232]
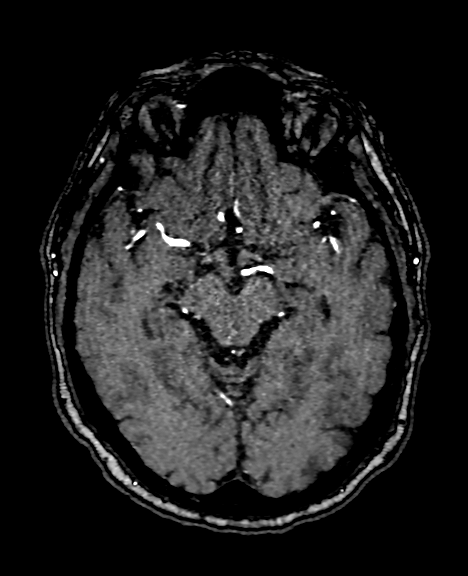
[im 163/232]
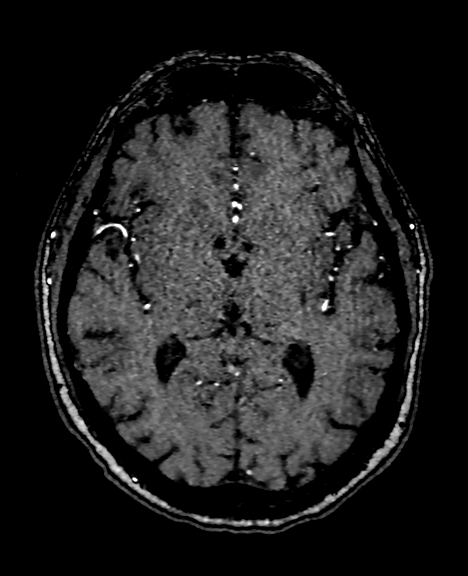
[im 192/232]
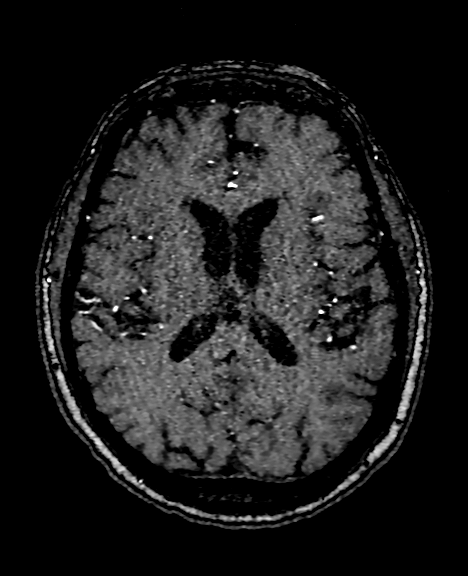
[im 197/232]
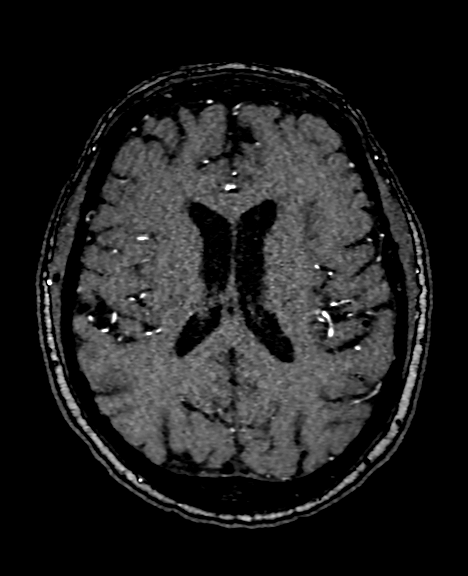
[im 222/232]
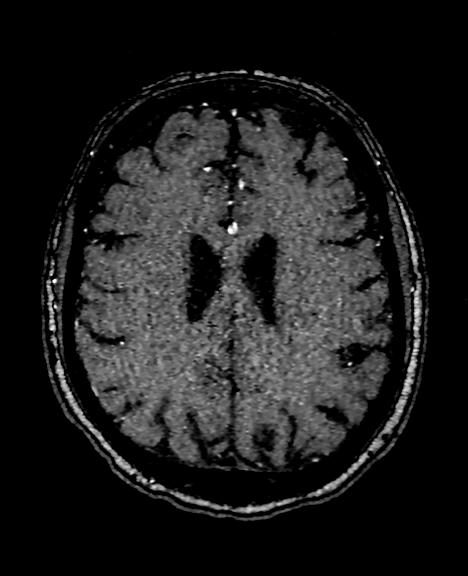

[18 of 48 positions shown; findings below may reference images not displayed]

FINDINGS: MRI HEAD FINDINGS

Brain: Multiple small acute right thalamocapsular infarcts.
Associated edema without mass effect. No acute hemorrhage. No mass
lesion. No extra-axial fluid collection. No hydrocephalus. Mild for
age scattered T2/FLAIR hyperintensities within the white matter,
likely related to chronic microvascular ischemic disease. Prior
small lacunar infarcts in the left thalamus. No midline shift. Basal
cisterns are patent.

Skull and upper cervical spine: Normal marrow signal.

Sinuses/Orbits: Sinuses are largely clear.  Unremarkable orbits.

Other: Moderate bilateral mastoid effusions.

MRA HEAD FINDINGS

Anterior circulation: No evidence of large vessel occlusion.
Mild-to-moderate stenosis of the left M1 MCA with approximately 40%
narrowing. There is a small (2.5 mm) anterosuperiorly directed
outpouching of the right paraclinoid ICA (series 100, image 116),
suspicious for aneurysm.

Posterior circulation: Intradural vertebral arteries and basilar
artery are patent without evidence of significant stenosis severe
stenosis of the right P2 PCA (series 100, image 125). Moderate to
severe stenosis of the left P2 PCA (series 100, image 138. There is
flow related signal in the more distal PCAs bilaterally.

MRA NECK FINDINGS

Carotids: There is bilateral carotid bifurcation narrowing likely
related to atherosclerosis without evidence of greater than 50%
narrowing.

Vertebral arteries: Codominant. Suspected mild to moderate left
vertebral artery origin stenosis, although evaluation is limited
proximally. Otherwise, the vertebral arteries are patent without
hemodynamically significant stenosis.
IMPRESSION: MRI head:

1. Multiple small acute RIGHT thalamocapsular infarcts. Associated
edema without mass effect.
2. Small remote lacunar infarct in the LEFT thalamus and mild for
age chronic microvascular ischemic disease.

MRA:

1. Severe right and moderate to severe left P2 PCA stenosis, as
detailed above.
2. Mild to moderate stenosis of the proximal left M1 MCA with
approximately 40% narrowing.
3. Suspected 2.5 mm right paraclinoid ICA aneurysm

MRA neck

1. Possible mild to moderate stenosis of the left vertebral artery
origin, although evaluation is limited proximally in the neck.
2. Otherwise, no evidence of hemodynamically significant stenosis in
the neck.

Findings discussed with Dr. ZHI Via telephone at [DATE].

## 2020-05-12 IMAGING — MR MR MRA NECK WO/W CM
4 series · 41 of 48 positions shown · IV contrast (7 ml Gadavist)
Comparison: Same day head ct

CLINICAL DATA: Stroke follow-up.



[Series 3: angio_fl3d_cor_pre_ttc=3.0s · coronal · B · 0.9mm · 0.85mm/px · 11 of 80 slices shown]
[im 1/80]
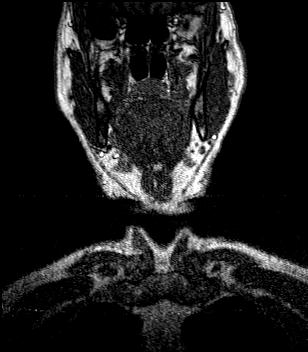
[im 8/80]
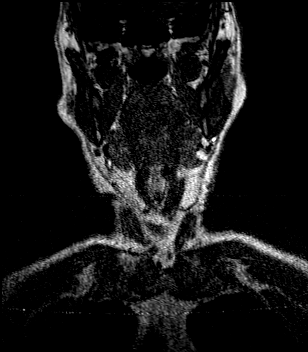
[im 16/80]
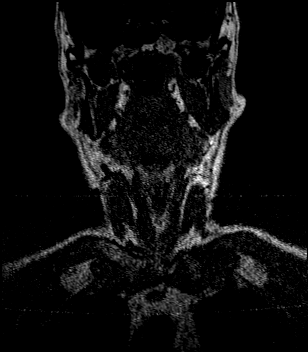
[im 24/80]
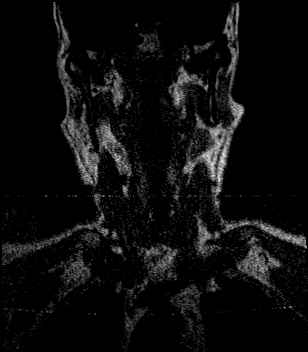
[im 32/80]
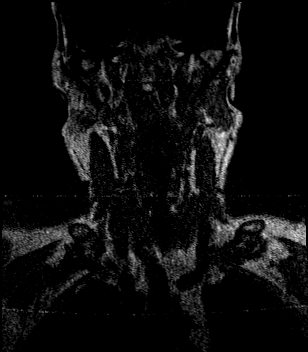
[im 40/80]
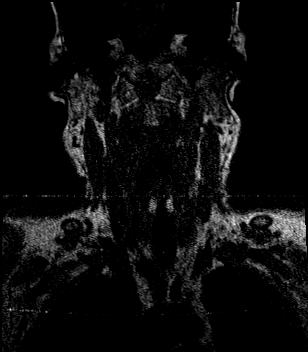
[im 48/80]
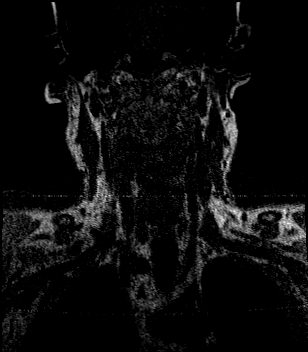
[im 56/80]
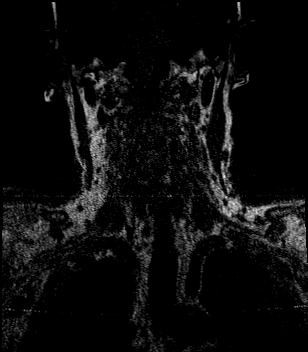
[im 64/80]
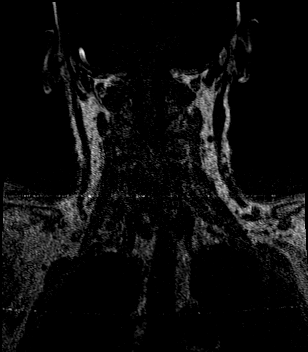
[im 72/80]
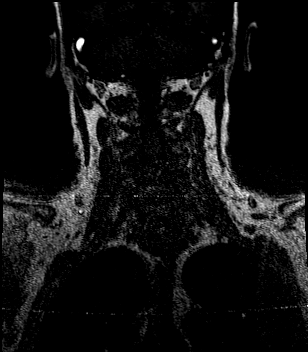
[im 80/80]
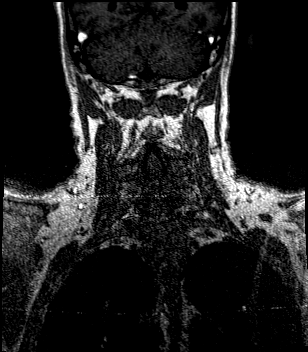

[Series 6: angio_fl3d_cor_post_ttc=3.0s · coronal · B · 0.9mm · 0.85mm/px · 10 of 80 slices shown]
[im 1/80]
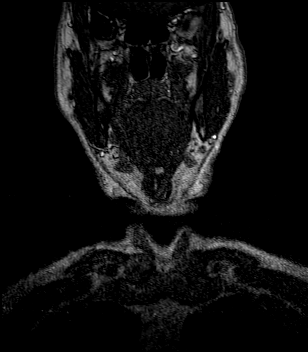
[im 9/80]
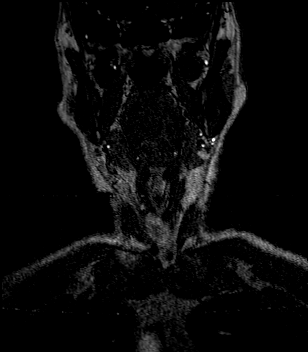
[im 18/80]
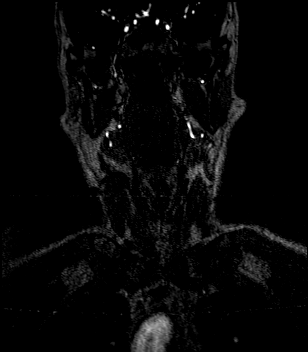
[im 27/80]
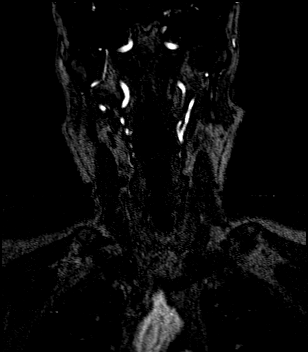
[im 36/80]
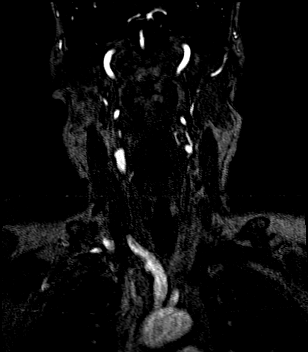
[im 44/80]
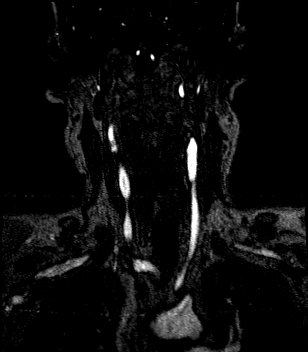
[im 53/80]
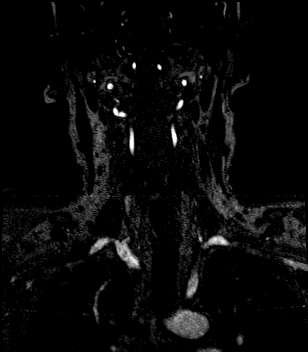
[im 62/80]
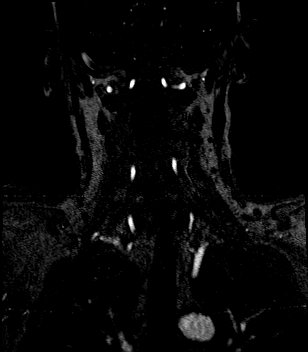
[im 71/80]
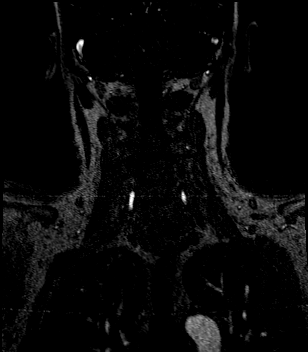
[im 80/80]
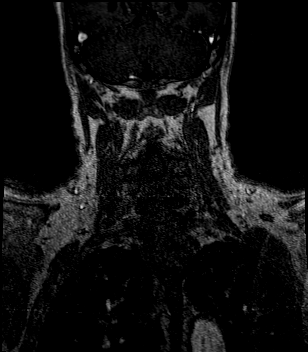

[Series 8: angio_fl3d_cor_post_ttc=3.0s_moco-adv_sub · coronal · B · 0.9mm · 0.85mm/px · 9 of 77 slices shown]
[im 1/77]
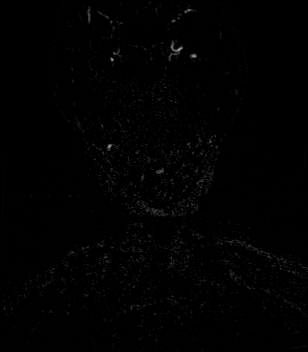
[im 9/77]
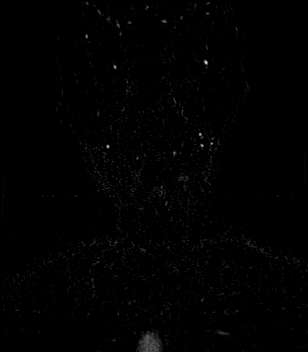
[im 17/77]
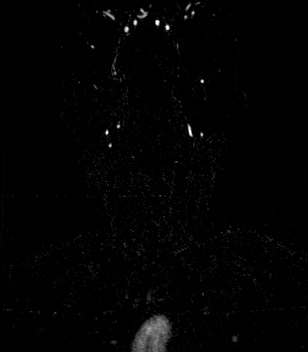
[im 26/77]
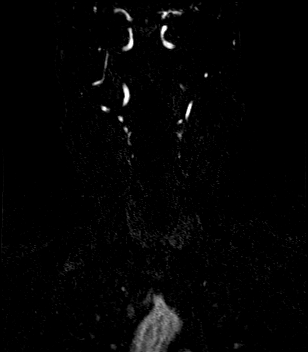
[im 34/77]
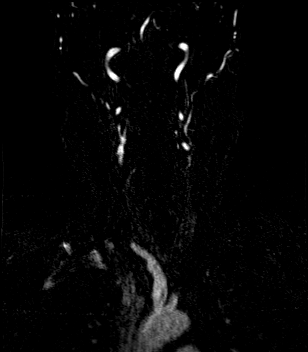
[im 43/77]
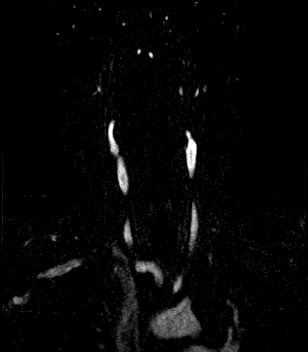
[im 51/77]
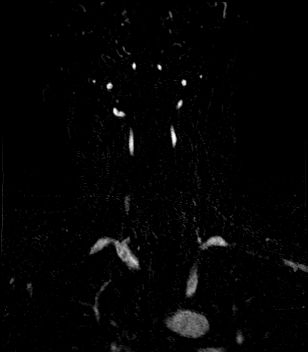
[im 68/77]
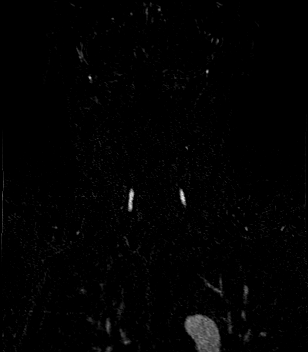
[im 77/77]
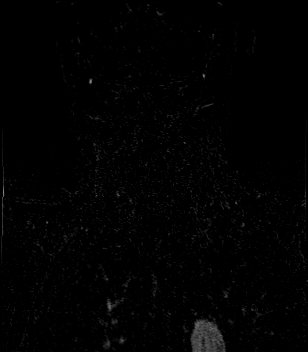

[Series 100: tof_fl3d_tra_iso · axial · B · 0.6mm · 0.52mm/px · z∈[+1,+68]mm · 11 of 133 slices shown]
[im 9/133]
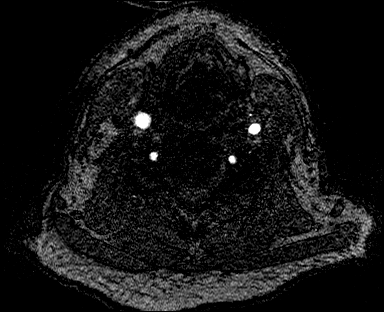
[im 17/133]
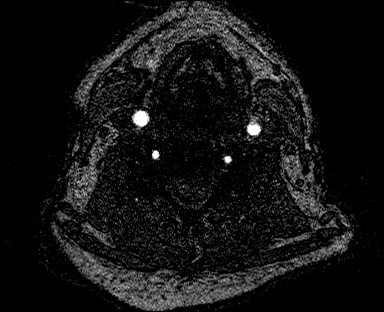
[im 25/133]
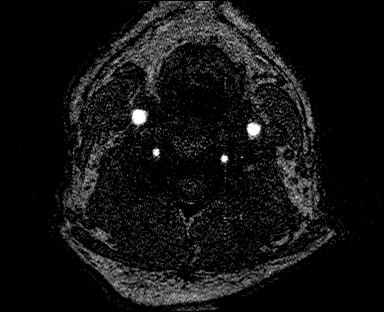
[im 42/133]
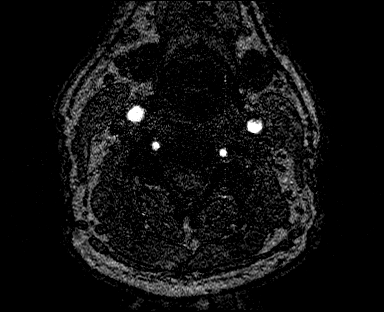
[im 58/133]
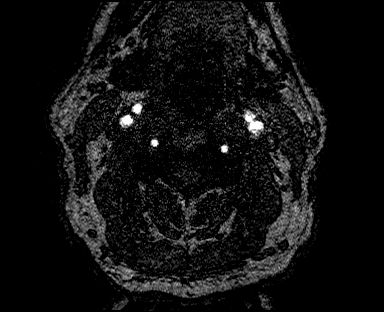
[im 67/133]
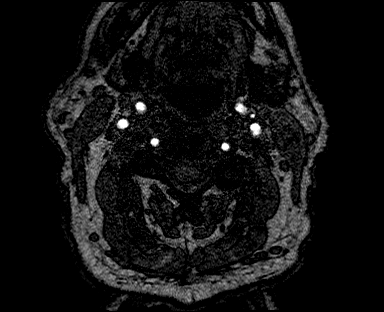
[im 75/133]
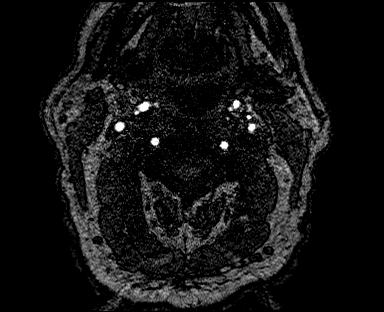
[im 91/133]
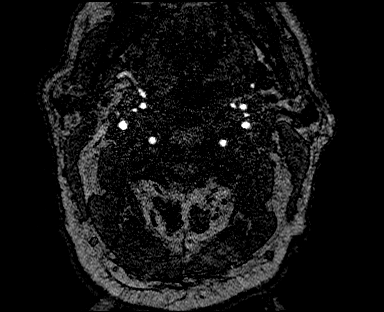
[im 108/133]
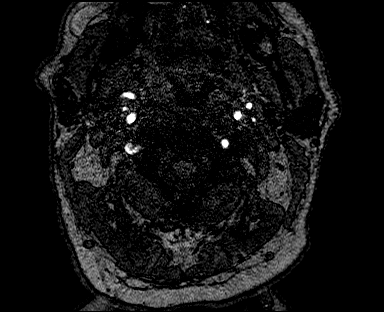
[im 116/133]
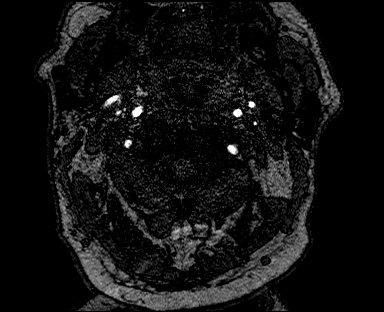
[im 124/133]
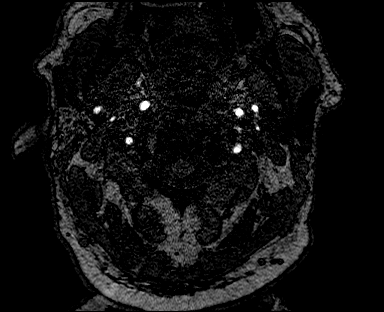

[41 of 48 positions shown; findings below may reference images not displayed]

FINDINGS: MRI HEAD FINDINGS

Brain: Multiple small acute right thalamocapsular infarcts.
Associated edema without mass effect. No acute hemorrhage. No mass
lesion. No extra-axial fluid collection. No hydrocephalus. Mild for
age scattered T2/FLAIR hyperintensities within the white matter,
likely related to chronic microvascular ischemic disease. Prior
small lacunar infarcts in the left thalamus. No midline shift. Basal
cisterns are patent.

Skull and upper cervical spine: Normal marrow signal.

Sinuses/Orbits: Sinuses are largely clear.  Unremarkable orbits.

Other: Moderate bilateral mastoid effusions.

MRA HEAD FINDINGS

Anterior circulation: No evidence of large vessel occlusion.
Mild-to-moderate stenosis of the left M1 MCA with approximately 40%
narrowing. There is a small (2.5 mm) anterosuperiorly directed
outpouching of the right paraclinoid ICA (series 100, image 116),
suspicious for aneurysm.

Posterior circulation: Intradural vertebral arteries and basilar
artery are patent without evidence of significant stenosis severe
stenosis of the right P2 PCA (series 100, image 125). Moderate to
severe stenosis of the left P2 PCA (series 100, image 138. There is
flow related signal in the more distal PCAs bilaterally.

MRA NECK FINDINGS

Carotids: There is bilateral carotid bifurcation narrowing likely
related to atherosclerosis without evidence of greater than 50%
narrowing.

Vertebral arteries: Codominant. Suspected mild to moderate left
vertebral artery origin stenosis, although evaluation is limited
proximally. Otherwise, the vertebral arteries are patent without
hemodynamically significant stenosis.
IMPRESSION: MRI head:

1. Multiple small acute RIGHT thalamocapsular infarcts. Associated
edema without mass effect.
2. Small remote lacunar infarct in the LEFT thalamus and mild for
age chronic microvascular ischemic disease.

MRA:

1. Severe right and moderate to severe left P2 PCA stenosis, as
detailed above.
2. Mild to moderate stenosis of the proximal left M1 MCA with
approximately 40% narrowing.
3. Suspected 2.5 mm right paraclinoid ICA aneurysm

MRA neck

1. Possible mild to moderate stenosis of the left vertebral artery
origin, although evaluation is limited proximally in the neck.
2. Otherwise, no evidence of hemodynamically significant stenosis in
the neck.

Findings discussed with Dr. ZHI Via telephone at [DATE].

## 2020-05-12 MED ORDER — ATORVASTATIN CALCIUM 10 MG PO TABS
10.0000 mg | ORAL_TABLET | Freq: Every day | ORAL | Status: DC
  Administered 2020-05-12 – 2020-05-13 (×2): 10 mg via ORAL
  Filled 2020-05-12 (×2): qty 1

## 2020-05-12 MED ORDER — ACETAMINOPHEN 650 MG RE SUPP: 650.0000 mg | RECTAL | Status: DC | PRN

## 2020-05-12 MED ORDER — SENNOSIDES-DOCUSATE SODIUM 8.6-50 MG PO TABS: 1.0000 | ORAL_TABLET | Freq: Every evening | ORAL | Status: DC | PRN

## 2020-05-12 MED ORDER — ENOXAPARIN SODIUM 40 MG/0.4ML ~~LOC~~ SOLN
40.0000 mg | SUBCUTANEOUS | Status: DC
  Administered 2020-05-12: 40 mg via SUBCUTANEOUS
  Filled 2020-05-12: qty 0.4

## 2020-05-12 MED ORDER — ACETAMINOPHEN 160 MG/5ML PO SOLN: 650.0000 mg | ORAL | Status: DC | PRN

## 2020-05-12 MED ORDER — GADOBUTROL 1 MMOL/ML IV SOLN
7.0000 mL | Freq: Once | INTRAVENOUS | Status: AC | PRN
  Administered 2020-05-12: 7 mL via INTRAVENOUS

## 2020-05-12 MED ORDER — ASPIRIN 325 MG PO TABS
325.0000 mg | ORAL_TABLET | Freq: Every day | ORAL | Status: DC
  Administered 2020-05-12 – 2020-05-13 (×2): 325 mg via ORAL
  Filled 2020-05-12 (×2): qty 1

## 2020-05-12 MED ORDER — ACETAMINOPHEN 325 MG PO TABS: 650.0000 mg | ORAL_TABLET | ORAL | Status: DC | PRN

## 2020-05-12 MED ORDER — ASPIRIN 300 MG RE SUPP: 300.0000 mg | Freq: Every day | RECTAL | Status: DC

## 2020-05-12 MED ORDER — STROKE: EARLY STAGES OF RECOVERY BOOK: Freq: Once | Status: DC

## 2020-05-12 NOTE — ED Notes (Signed)
Patient transported to MRI 

## 2020-05-12 NOTE — H&P (Signed)
History and Physical  Frank Lin TMH:[id number] DOB: 12/04/1950 DOA: 05/12/2020  Referring physician: Margarita Mail, PA-C, EDP PCP: Frank Sites, MD  Outpatient Specialists:   Patient Coming From: Home  Chief Complaint: left sided weakness  HPI: Frank Lin is a 70 y.o. male with a history of B12 deficiency, GERD, hyperlipidemia patient seen for left-sided upper and lower extremity weakness that started a few days ago.  Exact timing of onset is uncertain.  He has noticed some numbness in his left hand, which makes it difficult to grasp objects.  He also has weakness and difficulty walking.  He feels unsteady on his feet.  No palliating or provoking factors.  Emergency Department Course: CT and MRI suggestive of stroke.  As onset was a few days ago, code stroke was not called.  Review of Systems:   Pt denies any fevers, chills, nausea, vomiting, diarrhea, constipation, abdominal pain, shortness of breath, dyspnea on exertion, orthopnea, cough, wheezing, palpitations, headache, vision changes, lightheadedness, dizziness, melena, rectal bleeding.  Review of systems are otherwise negative  Past Medical History:  Diagnosis Date  . Anemia 11/28/2011  . B12 deficiency 11/29/2011  . CAD in native artery   . Colon polyps 11/30/2011  . GERD (gastroesophageal reflux disease)   . Leukopenia 11/30/2011  . Pancytopenia ([place]) 11/28/2011  . Peptic ulcer 1971  . Unintentional weight loss 11/28/2011   Past Surgical History:  Procedure Laterality Date  . APPENDECTOMY  1970  . CARDIAC CATHETERIZATION  1996   s/p PTCA/stent (Dr Frank Lin)  . COLON RESECTION  01/15/2012   Procedure: HAND ASSISTED LAPAROSCOPIC COLON RESECTION;  Surgeon: Frank So, MD;  Location: [facility];  Service: General;  Laterality: N/A;  partial colectomy  . COLONOSCOPY  12/30/2011   RMR: Multiple colonic polyps-status post multiple snare polypectomies and polyp debulking. 1 small rectal polyp removed as described above .  Sigmoid and rectum looked good endoscopically.   . ESOPHAGOGASTRODUODENOSCOPY  11/29/2014   RMR: Normal esophagus. Status post prior gastric surgery as described above. Abnoraml proximal small bowel status post biopsy. Status psot gastric biopsy.   Marland Kitchen PARTIAL COLECTOMY  01/15/2012   Procedure: PARTIAL COLECTOMY;  Surgeon: Frank So, MD;  Location: [facility];  Service: General;  Laterality: N/A;  . SEPTOPLASTY    . STOMACH SURGERY  1971   Hx PUD    Social History:  reports that he quit smoking about 23 years ago. His smoking use included cigarettes. He has a 22.50 pack-year smoking history. He has never used smokeless tobacco. He reports that he does not drink alcohol and does not use drugs. Patient lives at home  No Known Allergies  Family History  Problem Relation Age of Onset  . Heart failure Brother       Prior to Admission medications   Medication Sig Start Date End Date Taking? Authorizing Provider  atorvastatin (LIPITOR) 10 MG tablet Take 10 mg by mouth daily. 01/26/20  Yes [provider]  cyanocobalamin (,VITAMIN B-12,) 1000 MCG/ML injection INJECT 1 ML INTRAMUSCULARLY ONCE MONTHLY. 10/19/18  Yes Frank Jack, MD  ibuprofen (ADVIL,MOTRIN) 200 MG tablet Take 600 mg by mouth every 6 (six) hours as needed. Used rarely for pain   Yes [provider]  aspirin 81 MG tablet Take 81 mg by mouth daily. Patient not taking: No sig reported    [provider]    Physical Exam: BP (!) 160/81   Pulse 86   Temp 97.9 F (36.6 C) (Oral)  Resp 16   SpO2 97%   . General: Elderly male. Awake and alert and oriented x3. No acute cardiopulmonary distress.  Marland Kitchen HEENT: Normocephalic atraumatic.  Right and left ears normal in appearance.  Pupils equal, round, reactive to light. Extraocular muscles are intact. Sclerae anicteric and noninjected.  Moist mucosal membranes. No mucosal lesions.  . Neck: Neck supple without lymphadenopathy. No carotid bruits. No masses  palpated.  . Cardiovascular: Regular rate with normal S1-S2 sounds. No murmurs, rubs, gallops auscultated. No JVD.  Marland Kitchen Respiratory: Good respiratory effort with no wheezes, rales, rhonchi. Lungs clear to auscultation bilaterally.  No accessory muscle use. . Abdomen: Soft, nontender, nondistended. Active bowel sounds. No masses or hepatosplenomegaly  . Skin: No rashes, lesions, or ulcerations.  Dry, warm to touch. 2+ dorsalis pedis and radial pulses. . Musculoskeletal: No calf or leg pain. All major joints not erythematous nontender.  No upper or lower joint deformation.  Good ROM.  No contractures  . Psychiatric: Intact judgment and insight. Pleasant and cooperative. . Neurologic: Diminished sensation in the left hand.  Mild coordination deficit with past-pointing left hand.  Mild weakness in the left lower extremity.  Cranial nerves II through XII are grossly intact.           Labs on Admission: I have personally reviewed following labs and imaging studies  CBC: Recent Labs  Lab 05/12/20 1443 05/12/20 1533  WBC 9.9  --   NEUTROABS 7.3  --   HGB 17.4* 17.3*  HCT 51.7 51.0  MCV 89.1  --   PLT 175  --    Basic Metabolic Panel: Recent Labs  Lab 05/12/20 1443 05/12/20 1533  NA 140 139  K 5.0 4.5  CL 104 104  CO2 28  --   GLUCOSE 109* 103*  BUN 9 10  CREATININE 1.06 0.90  CALCIUM 9.9  --    GFR: CrCl cannot be calculated (Unknown ideal weight.). Liver Function Tests: Recent Labs  Lab 05/12/20 1443  AST 15  ALT 12  ALKPHOS 91  BILITOT 1.0  PROT 7.7  ALBUMIN 4.0   No results for input(s): LIPASE, AMYLASE in the last 168 hours. No results for input(s): AMMONIA in the last 168 hours. Coagulation Profile: Recent Labs  Lab 05/12/20 1443  INR 1.1   Cardiac Enzymes: No results for input(s): CKTOTAL, CKMB, CKMBINDEX, TROPONINI in the last 168 hours. BNP (last 3 results) No results for input(s): PROBNP in the last 8760 hours. HbA1C: No results for input(s): HGBA1C in  the last 72 hours. CBG: No results for input(s): GLUCAP in the last 168 hours. Lipid Profile: No results for input(s): CHOL, HDL, LDLCALC, TRIG, CHOLHDL, LDLDIRECT in the last 72 hours. Thyroid Function Tests: No results for input(s): TSH, T4TOTAL, FREET4, T3FREE, THYROIDAB in the last 72 hours. Anemia Panel: No results for input(s): VITAMINB12, FOLATE, FERRITIN, TIBC, IRON, RETICCTPCT in the last 72 hours. Urine analysis:    Component Value Date/Time   COLORURINE YELLOW 11/28/2011 [place] 11/28/2011 1506   LABSPEC <1.005 (L) 11/28/2011 1506   PHURINE 5.5 11/28/2011 1506   GLUCOSEU NEGATIVE 11/28/2011 1506   HGBUR NEGATIVE 11/28/2011 1506   BILIRUBINUR NEGATIVE 11/28/2011 1506   KETONESUR NEGATIVE 11/28/2011 1506   PROTEINUR NEGATIVE 11/28/2011 1506   UROBILINOGEN 0.2 11/28/2011 1506   NITRITE NEGATIVE 11/28/2011 1506   LEUKOCYTESUR NEGATIVE 11/28/2011 1506   Sepsis Labs: @LABRCNTIP (procalcitonin:4,lacticidven:4) ) Recent Results (from the past 240 hour(s))  Resp Panel by RT-PCR (Flu A&B, Covid) Nasopharyngeal  Swab     Status: None   Collection Time: 05/12/20  3:15 PM   Specimen: Nasopharyngeal Swab; Nasopharyngeal(NP) swabs in vial transport medium  Result Value Ref Range Status   SARS Coronavirus 2 by RT PCR NEGATIVE NEGATIVE Final    Comment: (NOTE) SARS-CoV-2 target nucleic acids are NOT DETECTED.  The SARS-CoV-2 RNA is generally detectable in upper respiratory specimens during the acute phase of infection. The lowest concentration of SARS-CoV-2 viral copies this assay can detect is 138 copies/mL. A negative result does not preclude SARS-Cov-2 infection and should not be used as the sole basis for treatment or other patient management decisions. A negative result may occur with  improper specimen collection/handling, submission of specimen other than nasopharyngeal swab, presence of viral mutation(s) within the areas targeted by this assay, and  inadequate number of viral copies(<138 copies/mL). A negative result must be combined with clinical observations, patient history, and epidemiological information. The expected result is Negative.  Fact Sheet for Patients:  EntrepreneurPulse.com.au  Fact Sheet for Healthcare Providers:  IncredibleEmployment.be  This test is no t yet approved or cleared by the [place] FDA and  has been authorized for detection and/or diagnosis of SARS-CoV-2 by FDA under an Emergency Use Authorization (EUA). This EUA will remain  in effect (meaning this test can be used) for the duration of the COVID-19 declaration under Section 564(b)(1) of the Act, 21 U.S.C.section 360bbb-3(b)(1), unless the authorization is terminated  or revoked sooner.       Influenza A by PCR NEGATIVE NEGATIVE Final   Influenza B by PCR NEGATIVE NEGATIVE Final    Comment: (NOTE) The Xpert Xpress SARS-CoV-2/FLU/RSV plus assay is intended as an aid in the diagnosis of influenza from Nasopharyngeal swab specimens and should not be used as a sole basis for treatment. Nasal washings and aspirates are unacceptable for Xpert Xpress SARS-CoV-2/FLU/RSV testing.  Fact Sheet for Patients: EntrepreneurPulse.com.au  Fact Sheet for Healthcare Providers: IncredibleEmployment.be  This test is not yet approved or cleared by the [place] FDA and has been authorized for detection and/or diagnosis of SARS-CoV-2 by FDA under an Emergency Use Authorization (EUA). This EUA will remain in effect (meaning this test can be used) for the duration of the COVID-19 declaration under Section 564(b)(1) of the Act, 21 U.S.C. section 360bbb-3(b)(1), unless the authorization is terminated or revoked.  Performed at [facility], [address] [place], [place] [address]      Radiological Exams on Admission: CT Head Wo Contrast  Result Date: 05/12/2020 CLINICAL DATA:   Left arm and leg numbness EXAM: CT HEAD WITHOUT CONTRAST TECHNIQUE: Contiguous axial images were obtained from the base of the skull through the vertex without intravenous contrast. COMPARISON:  None. FINDINGS: Brain: There is no acute intracranial hemorrhage, mass effect, or edema. Gray-white differentiation is preserved. Age-indeterminate infarct of the posterior left thalamus. Questionable small hypodense focus in the lateral right thalamus. There is no extra-axial fluid collection. Ventricles and sulci are within normal limits in size and configuration. Vascular: No hyperdense vessel.There is atherosclerotic calcification at the skull base. Skull: Calvarium is unremarkable. Sinuses/Orbits: No acute finding. Other: None. IMPRESSION: No acute intracranial hemorrhage. Age-indeterminate left thalamic infarct. Questionable additional age-indeterminate right thalamic infarct. Electronically Signed   By: Macy Mis M.D.   On: 05/12/2020 14:17   MR ANGIO HEAD WO CONTRAST  Result Date: 05/12/2020 CLINICAL DATA:  Stroke follow-up. EXAM: MRI HEAD WITHOUT CONTRAST MRA HEAD WITHOUT CONTRAST MRA NECK WITHOUT CONTRAST TECHNIQUE: Multiplanar, multiecho pulse sequences of  the brain and surrounding structures were obtained without intravenous contrast. Angiographic images of the Circle of Willis were obtained using MRA technique without intravenous contrast. Angiographic images of the neck were obtained using MRA technique without intravenous contrast. Carotid stenosis measurements (when applicable) are obtained utilizing NASCET criteria, using the distal internal carotid diameter as the denominator. COMPARISON:  Same day head ct FINDINGS: MRI HEAD FINDINGS Brain: Multiple small acute right thalamocapsular infarcts. Associated edema without mass effect. No acute hemorrhage. No mass lesion. No extra-axial fluid collection. No hydrocephalus. Mild for age scattered T2/FLAIR hyperintensities within the white matter, likely  related to chronic microvascular ischemic disease. Prior small lacunar infarcts in the left thalamus. No midline shift. Basal cisterns are patent. Skull and upper cervical spine: Normal marrow signal. Sinuses/Orbits: Sinuses are largely clear.  Unremarkable orbits. Other: Moderate bilateral mastoid effusions. MRA HEAD FINDINGS Anterior circulation: No evidence of large vessel occlusion. Mild-to-moderate stenosis of the left M1 MCA with approximately 40% narrowing. There is a small (2.5 mm) anterosuperiorly directed outpouching of the right paraclinoid ICA (series 100, image 116), suspicious for aneurysm. Posterior circulation: Intradural vertebral arteries and basilar artery are patent without evidence of significant stenosis severe stenosis of the right P2 PCA (series 100, image 125). Moderate to severe stenosis of the left P2 PCA (series 100, image 138. There is flow related signal in the more distal PCAs bilaterally. MRA NECK FINDINGS Carotids: There is bilateral carotid bifurcation narrowing likely related to atherosclerosis without evidence of greater than 50% narrowing. Vertebral arteries: Codominant. Suspected mild to moderate left vertebral artery origin stenosis, although evaluation is limited proximally. Otherwise, the vertebral arteries are patent without hemodynamically significant stenosis. IMPRESSION: MRI head: 1. Multiple small acute RIGHT thalamocapsular infarcts. Associated edema without mass effect. 2. Small remote lacunar infarct in the LEFT thalamus and mild for age chronic microvascular ischemic disease. MRA: 1. 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 2. Mild to moderate stenosis of the proximal left M1 MCA with approximately 40% narrowing. 3. 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 Possible mild to moderate stenosis of the left vertebral artery origin, although evaluation is limited proximally in the neck. 2. Otherwise, no evidence of  hemodynamically significant stenosis in the neck. Findings discussed with Dr. Laverta Baltimore Via telephone at 5:00 PM. Electronically Signed   By: Margaretha Sheffield MD   On: 05/12/2020 17:06   MR Angiogram Neck W or Wo Contrast  Result Date: 05/12/2020 CLINICAL DATA:  Stroke follow-up. EXAM: MRI HEAD WITHOUT CONTRAST MRA HEAD WITHOUT CONTRAST MRA NECK WITHOUT CONTRAST TECHNIQUE: Multiplanar, multiecho pulse sequences of the brain and surrounding structures were obtained without intravenous contrast. Angiographic images of the Circle of Willis were obtained using MRA technique without intravenous contrast. Angiographic images of the neck were obtained using MRA technique without intravenous contrast. Carotid stenosis measurements (when applicable) are obtained utilizing NASCET criteria, using the distal internal carotid diameter as the denominator. COMPARISON:  Same day head ct FINDINGS: MRI HEAD FINDINGS Brain: Multiple small acute right thalamocapsular infarcts. Associated edema without mass effect. No acute hemorrhage. No mass lesion. No extra-axial fluid collection. No hydrocephalus. Mild for age scattered T2/FLAIR hyperintensities within the white matter, likely related to chronic microvascular ischemic disease. Prior small lacunar infarcts in the left thalamus. No midline shift. Basal cisterns are patent. Skull and upper cervical spine: Normal marrow signal. Sinuses/Orbits: Sinuses are largely clear.  Unremarkable orbits. Other: Moderate bilateral mastoid effusions. MRA HEAD FINDINGS Anterior circulation:  No evidence of large vessel occlusion. Mild-to-moderate stenosis of the left M1 MCA with approximately 40% narrowing. There is a small (2.5 mm) anterosuperiorly directed outpouching of the right paraclinoid ICA (series 100, image 116), suspicious for aneurysm. Posterior circulation: Intradural vertebral arteries and basilar artery are patent without evidence of significant stenosis severe stenosis of the right P2  PCA (series 100, image 125). Moderate to severe stenosis of the left P2 PCA (series 100, image 138. There is flow related signal in the more distal PCAs bilaterally. MRA NECK FINDINGS Carotids: There is bilateral carotid bifurcation narrowing likely related to atherosclerosis without evidence of greater than 50% narrowing. Vertebral arteries: Codominant. Suspected mild to moderate left vertebral artery origin stenosis, although evaluation is limited proximally. Otherwise, the vertebral arteries are patent without hemodynamically significant stenosis. IMPRESSION: MRI head: 1. Multiple small acute RIGHT thalamocapsular infarcts. Associated edema without mass effect. 2. Small remote lacunar infarct in the LEFT thalamus and mild for age chronic microvascular ischemic disease. MRA: 1. 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 2. Mild to moderate stenosis of the proximal left M1 MCA with approximately 40% narrowing. 3. 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 Possible mild to moderate stenosis of the left vertebral artery origin, although evaluation is limited proximally in the neck. 2. Otherwise, no evidence of hemodynamically significant stenosis in the neck. Findings discussed with Dr. Laverta Baltimore Via telephone at 5:00 PM. Electronically Signed   By: Margaretha Sheffield MD   On: 05/12/2020 17:06   MR BRAIN WO CONTRAST  Result Date: 05/12/2020 CLINICAL DATA:  Stroke follow-up. EXAM: MRI HEAD WITHOUT CONTRAST MRA HEAD WITHOUT CONTRAST MRA NECK WITHOUT CONTRAST TECHNIQUE: Multiplanar, multiecho pulse sequences of the brain and surrounding structures were obtained without intravenous contrast. Angiographic images of the Circle of Willis were obtained using MRA technique without intravenous contrast. Angiographic images of the neck were obtained using MRA technique without intravenous contrast. Carotid stenosis measurements (when applicable) are obtained utilizing NASCET  criteria, using the distal internal carotid diameter as the denominator. COMPARISON:  Same day head ct FINDINGS: MRI HEAD FINDINGS Brain: Multiple small acute right thalamocapsular infarcts. Associated edema without mass effect. No acute hemorrhage. No mass lesion. No extra-axial fluid collection. No hydrocephalus. Mild for age scattered T2/FLAIR hyperintensities within the white matter, likely related to chronic microvascular ischemic disease. Prior small lacunar infarcts in the left thalamus. No midline shift. Basal cisterns are patent. Skull and upper cervical spine: Normal marrow signal. Sinuses/Orbits: Sinuses are largely clear.  Unremarkable orbits. Other: Moderate bilateral mastoid effusions. MRA HEAD FINDINGS Anterior circulation: No evidence of large vessel occlusion. Mild-to-moderate stenosis of the left M1 MCA with approximately 40% narrowing. There is a small (2.5 mm) anterosuperiorly directed outpouching of the right paraclinoid ICA (series 100, image 116), suspicious for aneurysm. Posterior circulation: Intradural vertebral arteries and basilar artery are patent without evidence of significant stenosis severe stenosis of the right P2 PCA (series 100, image 125). Moderate to severe stenosis of the left P2 PCA (series 100, image 138. There is flow related signal in the more distal PCAs bilaterally. MRA NECK FINDINGS Carotids: There is bilateral carotid bifurcation narrowing likely related to atherosclerosis without evidence of greater than 50% narrowing. Vertebral arteries: Codominant. Suspected mild to moderate left vertebral artery origin stenosis, although evaluation is limited proximally. Otherwise, the vertebral arteries are patent without hemodynamically significant stenosis. IMPRESSION: MRI head: 1. Multiple small acute RIGHT thalamocapsular infarcts. Associated edema without mass effect. 2.  Small remote lacunar infarct in the LEFT thalamus and mild for age chronic microvascular ischemic disease.  MRA: 1. 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 2. Mild to moderate stenosis of the proximal left M1 MCA with approximately 40% narrowing. 3. 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 Possible mild to moderate stenosis of the left vertebral artery origin, although evaluation is limited proximally in the neck. 2. Otherwise, no evidence of hemodynamically significant stenosis in the neck. Findings discussed with Dr. Laverta Baltimore Via telephone at 5:00 PM. Electronically Signed   By: Margaretha Sheffield MD   On: 05/12/2020 17:06    EKG: Independently reviewed.  Rhythm with left posterior fascicular block.  No acute ST changes.  Assessment/Plan: Active Problems:   Acute stroke due to ischemia [facility])    This patient was discussed with the ED physician, including pertinent vitals, physical exam findings, labs, and imaging.  We also discussed care given by the ED provider.  1. Acute stroke secondary to ischemia Admit on telemetry MRI/MRA head and neck done Echocardiogram tomorrow Hemoglobin A1c, lipid panel in the morning PT/OT/speech therapy consult Full aspirin  DVT prophylaxis: Lovenox Consultants: None Code Status: Full code Family Communication: Wife present during interview and exam Disposition Plan: Pending   Truett Mainland, DO

## 2020-05-12 NOTE — ED Triage Notes (Signed)
C/o weakness in left leg for 2 days. States he fell this am, also has some tingling in both hans for 2 days

## 2020-05-12 NOTE — ED Provider Notes (Signed)
[facility] EMERGENCY DEPARTMENT Provider Note   CSN: [id number] Arrival date & time: 05/12/20  1117     History Chief Complaint  Patient presents with  . Extremity Weakness    Frank Lin is a 70 y.o. male who presents emergency department with chief complaint of left leg and arm weakness.  Patient states that about 4 5 days ago he woke up in the morning with left leg and arm weakness.  He states that he he is having a lot of difficulty walking because his balance is poor and his leg is weak and he has to hold onto things.  He also has some numbness in the left hand.  He has never had anything like this before.  He is a daily smoker.  He takes medication only for cholesterol.  He is not on any other medications except for a monthly B12 injection.  He has a history of cardiac stent placement when he was 71 years old.  He denies any headaches, changes in vision, difficulty with speech or swallowing.  HPI     Past Medical History:  Diagnosis Date  . Anemia 11/28/2011  . B12 deficiency 11/29/2011  . CAD in native artery   . Colon polyps 11/30/2011  . GERD (gastroesophageal reflux disease)   . Leukopenia 11/30/2011  . Pancytopenia ([place]) 11/28/2011  . Peptic ulcer 1971  . Unintentional weight loss 11/28/2011    Patient Active Problem List   Diagnosis Date Noted  . Pernicious anemia 08/03/2015  . Colon polyps 11/30/2011  . B12 deficiency 11/29/2011  . Unintentional weight loss 11/28/2011  . Heme positive stool 11/28/2011  . Tobacco use disorder 11/28/2011  . PUD (peptic ulcer disease) 11/28/2011    Past Surgical History:  Procedure Laterality Date  . APPENDECTOMY  1970  . CARDIAC CATHETERIZATION  1996   s/p PTCA/stent (Dr Lia Foyer)  . COLON RESECTION  01/15/2012   Procedure: HAND ASSISTED LAPAROSCOPIC COLON RESECTION;  Surgeon: Jamesetta So, MD;  Location: [facility];  Service: General;  Laterality: N/A;  partial colectomy  . COLONOSCOPY  12/30/2011   RMR: Multiple colonic  polyps-status post multiple snare polypectomies and polyp debulking. 1 small rectal polyp removed as described above . Sigmoid and rectum looked good endoscopically.   . ESOPHAGOGASTRODUODENOSCOPY  11/29/2014   RMR: Normal esophagus. Status post prior gastric surgery as described above. Abnoraml proximal small bowel status post biopsy. Status psot gastric biopsy.   Marland Kitchen PARTIAL COLECTOMY  01/15/2012   Procedure: PARTIAL COLECTOMY;  Surgeon: Jamesetta So, MD;  Location: [facility];  Service: General;  Laterality: N/A;  . SEPTOPLASTY    . STOMACH SURGERY  1971   Hx PUD        Family History  Problem Relation Age of Onset  . Heart failure Brother     Social History   Tobacco Use  . Smoking status: Former Smoker    Packs/day: 0.50    Years: 45.00    Pack years: 22.50    Types: Cigarettes    Quit date: 11/27/1996    Years since quitting: 23.4  . Smokeless tobacco: Never Used  Substance Use Topics  . Alcohol use: No  . Drug use: No    Home Medications Prior to Admission medications   Medication Sig Start Date End Date Taking? Authorizing Provider  aspirin 81 MG tablet Take 81 mg by mouth daily.    [provider]  cyanocobalamin (,VITAMIN B-12,) 1000 MCG/ML injection INJECT 1 ML INTRAMUSCULARLY ONCE MONTHLY.  10/19/18   Derek Jack, MD  ibuprofen (ADVIL,MOTRIN) 200 MG tablet Take 200 mg by mouth every 6 (six) hours as needed. Used rarely for pain    [provider]    Allergies    Patient has no known allergies.  Review of Systems   Review of Systems Ten systems reviewed and are negative for acute change, except as noted in the HPI.   Physical Exam Updated Vital Signs BP (!) 176/98   Pulse 93   Temp 97.9 F (36.6 C) (Oral)   Resp 13   SpO2 100%   Physical Exam Vitals and nursing note reviewed.  Constitutional:      General: He is not in acute distress.    Appearance: He is well-developed and well-nourished. He is not diaphoretic.  HENT:      Head: Normocephalic and atraumatic.  Eyes:     General: No scleral icterus.    Conjunctiva/sclera: Conjunctivae normal.  Cardiovascular:     Rate and Rhythm: Normal rate and regular rhythm.     Heart sounds: Normal heart sounds.  Pulmonary:     Effort: Pulmonary effort is normal. No respiratory distress.     Breath sounds: Normal breath sounds.  Abdominal:     Palpations: Abdomen is soft.     Tenderness: There is no abdominal tenderness.  Musculoskeletal:        General: No edema.     Cervical back: Normal range of motion and neck supple.  Skin:    General: Skin is warm and dry.  Neurological:     Mental Status: He is alert and oriented to person, place, and time.     GCS: GCS eye subscore is 4. GCS verbal subscore is 5. GCS motor subscore is 6.     Cranial Nerves: Cranial nerves are intact.     Sensory: Sensory deficit (abnormal in the left arm) present.     Motor: Weakness and pronator drift present.     Coordination: Coordination abnormal. Finger-Nose-Finger Test abnormal.     Gait: Gait abnormal and tandem walk abnormal (unable to tandem walk).     Comments: Patient is alert and oriented.  Speech is goal oriented without dysarthria.  Cranial nerves II through XII appear to be intact.  Patient has obvious weakness of the left upper extremity, pronator drift of the left upper extremity.  Also weakness of the left lower extremity.  Sensation appears to be equal in the lower extremities bilateral.  Patient has marked dysmetria with the left hand with finger-to-nose and with heel-to-shin.  Patient's gait is obviously abnormal with the left leg everted and dragging.  He is unable to do tandem walking or balance.  Psychiatric:        Behavior: Behavior normal.     ED Results / Procedures / Treatments   Labs (all labs ordered are listed, but only abnormal results are displayed) Labs Reviewed  CBC - Abnormal; Notable for the following components:      Result Value   Hemoglobin 17.4  (*)    All other components within normal limits  COMPREHENSIVE METABOLIC PANEL - Abnormal; Notable for the following components:   Glucose, Bld 109 (*)    All other components within normal limits  I-STAT CHEM 8, ED - Abnormal; Notable for the following components:   Glucose, Bld 103 (*)    Hemoglobin 17.3 (*)    All other components within normal limits  RESP PANEL BY RT-PCR (FLU A&B, COVID) ARPGX2  ETHANOL  PROTIME-INR  APTT  DIFFERENTIAL  RAPID URINE DRUG SCREEN, HOSP PERFORMED  URINALYSIS, ROUTINE W REFLEX MICROSCOPIC  HEMOGLOBIN A1C  LIPID PANEL    EKG None  Radiology CT Head Wo Contrast  Result Date: 05/12/2020 CLINICAL DATA:  Left arm and leg numbness EXAM: CT HEAD WITHOUT CONTRAST TECHNIQUE: Contiguous axial images were obtained from the base of the skull through the vertex without intravenous contrast. COMPARISON:  None. FINDINGS: Brain: There is no acute intracranial hemorrhage, mass effect, or edema. Gray-white differentiation is preserved. Age-indeterminate infarct of the posterior left thalamus. Questionable small hypodense focus in the lateral right thalamus. There is no extra-axial fluid collection. Ventricles and sulci are within normal limits in size and configuration. Vascular: No hyperdense vessel.There is atherosclerotic calcification at the skull base. Skull: Calvarium is unremarkable. Sinuses/Orbits: No acute finding. Other: None. IMPRESSION: No acute intracranial hemorrhage. Age-indeterminate left thalamic infarct. Questionable additional age-indeterminate right thalamic infarct. Electronically Signed   By: Macy Mis M.D.   On: 05/12/2020 14:17    Procedures .Critical Care Performed by: Margarita Mail, PA-C Authorized by: Margarita Mail, PA-C   Critical care provider statement:    Critical care time (minutes):  45   Critical care time was exclusive of:  Separately billable procedures and treating other patients   Critical care was necessary to treat  or prevent imminent or life-threatening deterioration of the following conditions:  CNS failure or compromise   Critical care was time spent personally by me on the following activities:  Discussions with consultants, evaluation of patient's response to treatment, examination of patient, ordering and performing treatments and interventions, ordering and review of laboratory studies, ordering and review of radiographic studies, pulse oximetry, re-evaluation of patient's condition, obtaining history from patient or surrogate and review of old charts     Medications Ordered in ED Medications - No data to display  ED Course  I have reviewed the triage vital signs and the nursing notes.  Pertinent labs & imaging results that were available during my care of the patient were reviewed by me and considered in my medical decision making (see chart for details).    MDM Rules/Calculators/A&P                          UR:[id number] VS:  Vitals:   05/12/20 1900 05/12/20 1930 05/12/20 2030 05/12/20 2045  BP: (!) 155/91 (!) 160/81  (!) 172/99  Pulse: 84 86  78  Resp: 16 16  18   Temp:    98.4 F (36.9 C)  TempSrc:    Oral  SpO2: 98% 97%  99%  Weight:   82 kg   Height:   5\' 9"  (1.753 m)     SE:GBTDVVO is gathered by patient and daughter at bedside. Previous records obtained and reviewed. DDX:The patient's complaint of weakness involves an extensive number of diagnostic and treatment options, and is a complaint that carries with it a high risk of complications, morbidity, and potential mortality. Given the large differential diagnosis, medical decision making is of high complexity. The differential diagnosis of weakness includes but is not limited to neurologic causes (GBS, myasthenia gravis, CVA, MS, ALS, transverse myelitis, spinal cord injury, CVA, botulism, ) and other causes: ACS, Arrhythmia, syncope, orthostatic hypotension, sepsis, hypoglycemia, electrolyte disturbance, hypothyroidism, respiratory  failure, symptomatic anemia, dehydration, heat injury, polypharmacy, malignancy.  Labs: I ordered reviewed and interpreted labs which include CBC shows elevated hemoglobin consistent with history of tobacco abuse, CMP with mildly elevated  glucose of insignificant value. Respiratory panel, ethanol, APTT, PT/INR all within normal limits. Imaging: I ordered and reviewed images which included CT head, MR brain, MRA of the head and neck. I independently visualized and interpreted all imaging. Significant findings include.  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 Cheral Marker of the neurology service and Dr. Nehemiah Settle of the hospitalist service for admission MDM: Patient with sub-acute thalamic infarct.  He will be admitted for stroke.  He is stable throughout his ED visit. Patient disposition:The patient appears reasonably stabilized for admission considering the current resources, flow, and capabilities available in the ED at this time, and I doubt any other [facility] requiring further screening and/or treatment in the ED prior to admission.        Final Clinical Impression(s) / ED Diagnoses Final diagnoses:  None    Rx / DC Orders ED Discharge Orders    None       Margarita Mail, PA-C 05/12/20 2301    Margette Fast, MD 05/15/20 1023

## 2020-05-13 ENCOUNTER — Inpatient Hospital Stay (HOSPITAL_BASED_OUTPATIENT_CLINIC_OR_DEPARTMENT_OTHER): Payer: Medicare HMO

## 2020-05-13 ENCOUNTER — Encounter (HOSPITAL_COMMUNITY): Payer: Self-pay | Admitting: Family Medicine

## 2020-05-13 ENCOUNTER — Inpatient Hospital Stay (HOSPITAL_COMMUNITY): Payer: Medicare HMO

## 2020-05-13 DIAGNOSIS — I6389 Other cerebral infarction: Secondary | ICD-10-CM

## 2020-05-13 DIAGNOSIS — I639 Cerebral infarction, unspecified: Secondary | ICD-10-CM | POA: Diagnosis not present

## 2020-05-13 DIAGNOSIS — I63233 Cerebral infarction due to unspecified occlusion or stenosis of bilateral carotid arteries: Secondary | ICD-10-CM | POA: Diagnosis not present

## 2020-05-13 DIAGNOSIS — R7309 Other abnormal glucose: Secondary | ICD-10-CM | POA: Diagnosis not present

## 2020-05-13 DIAGNOSIS — Z13220 Encounter for screening for lipoid disorders: Secondary | ICD-10-CM | POA: Diagnosis not present

## 2020-05-13 LAB — HEMOGLOBIN A1C
Hgb A1c MFr Bld: 5.6 % (ref 4.8–5.6)
Mean Plasma Glucose: 114.02 mg/dL

## 2020-05-13 LAB — LIPID PANEL
Cholesterol: 129 mg/dL (ref 0–200)
HDL: 26 mg/dL — ABNORMAL LOW (ref >40)
LDL Cholesterol: 75 mg/dL (ref 0–99)
Total CHOL/HDL Ratio: 5 RATIO
Triglycerides: 138 mg/dL (ref <150)
VLDL: 28 mg/dL (ref 0–40)

## 2020-05-13 LAB — ECHOCARDIOGRAM COMPLETE
Area-P 1/2: 2.86 cm2
Calc EF: 51.9 %
Height: 69 in
S' Lateral: 4 cm
Single Plane A2C EF: 51.5 %
Single Plane A4C EF: 50.6 %
Weight: 2892.44 oz

## 2020-05-13 IMAGING — US US CAROTID DUPLEX BILAT
1 series · 13 of 24 positions shown · non-contrast
Comparison: [DATE] and MRA examination [DATE]

CLINICAL DATA: Stroke.

EXAM:
BILATERAL CAROTID DUPLEX ULTRASOUND
TECHNIQUE: Gray scale imaging, color Doppler and duplex ultrasound were
performed of bilateral carotid and vertebral arteries in the neck.

[Series 1: us carotid bilateral · 13 of 69 slices shown]
[im 1/69]
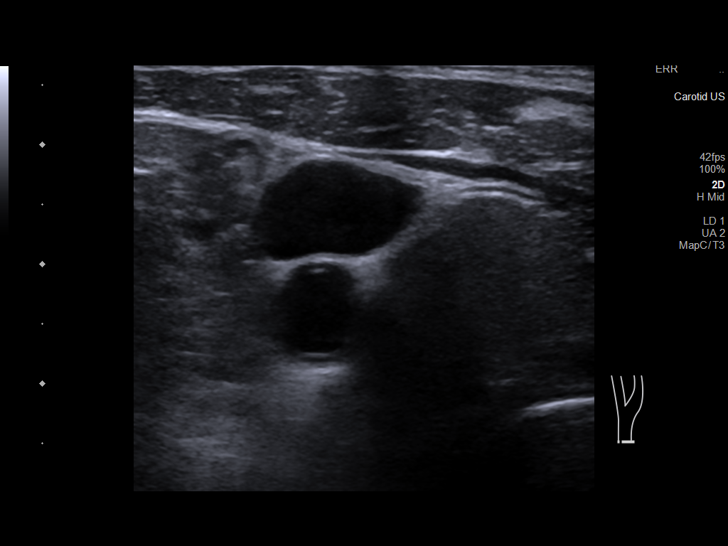
[im 6/69]
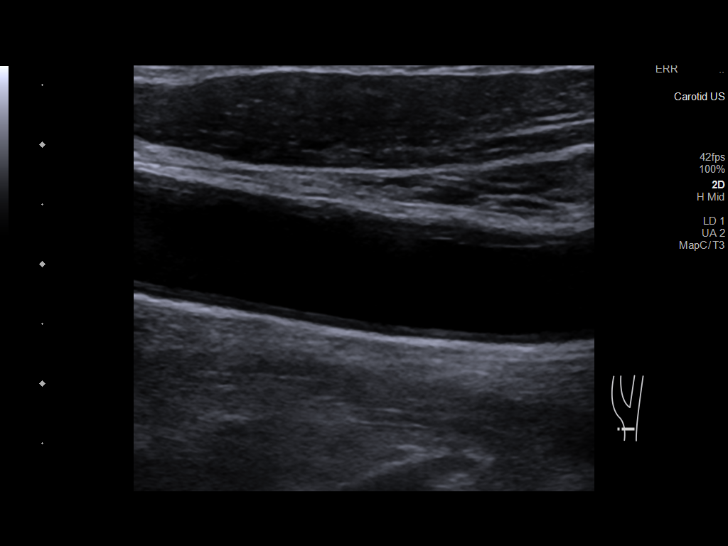
[im 12/69]
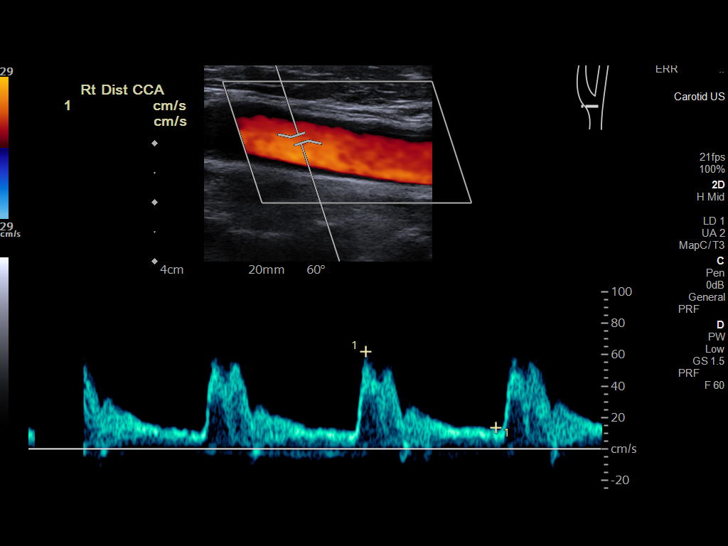
[im 18/69]
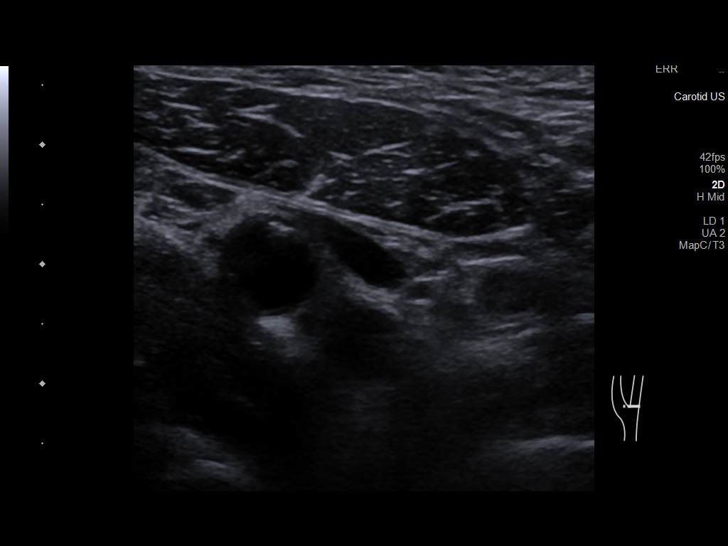
[im 24/69]
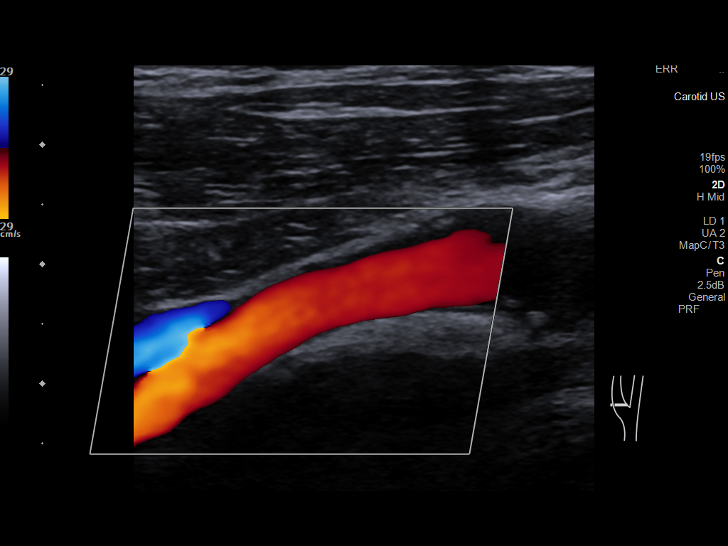
[im 30/69]
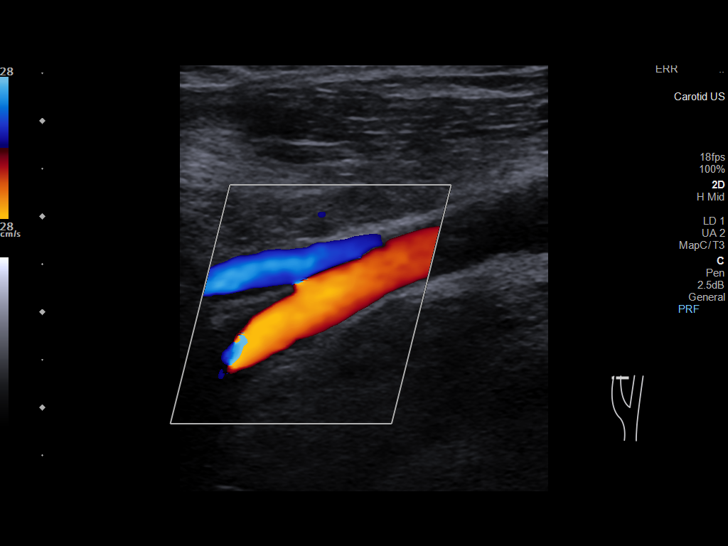
[im 36/69]
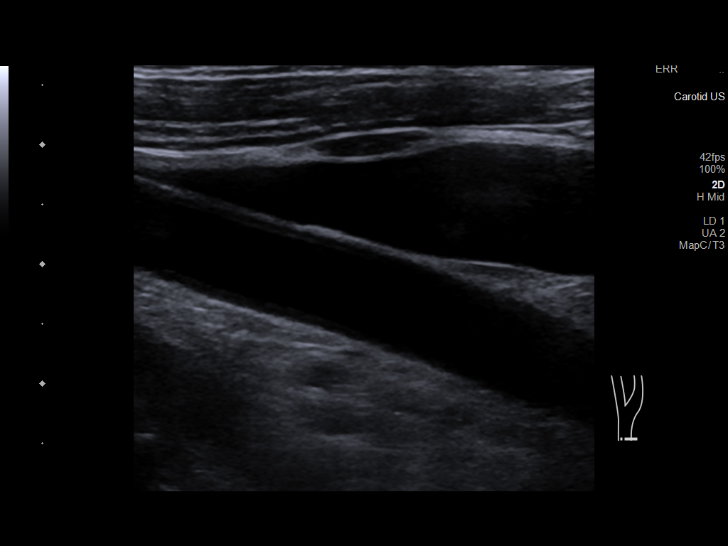
[im 39/69]
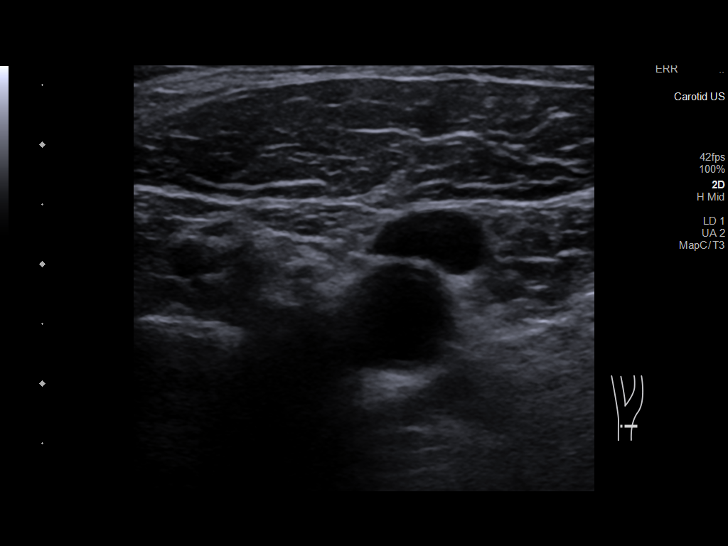
[im 45/69]
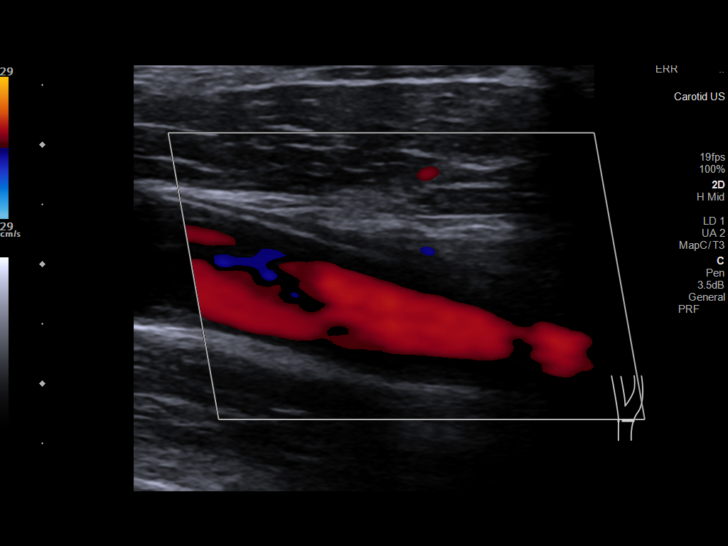
[im 51/69]
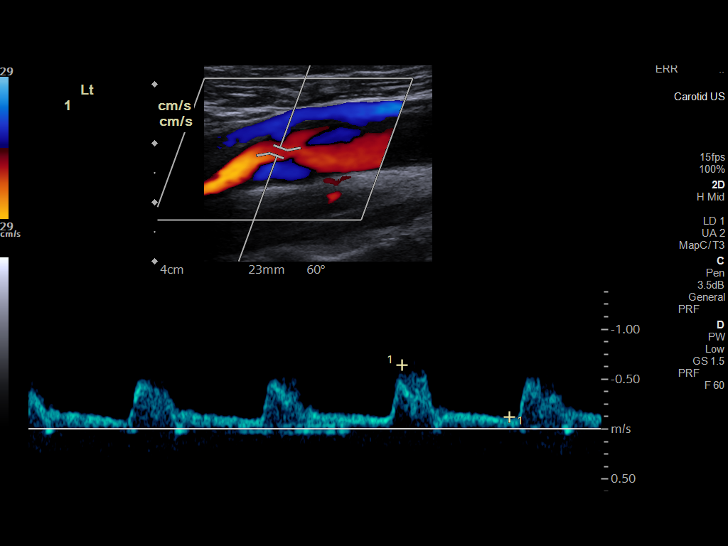
[im 57/69]
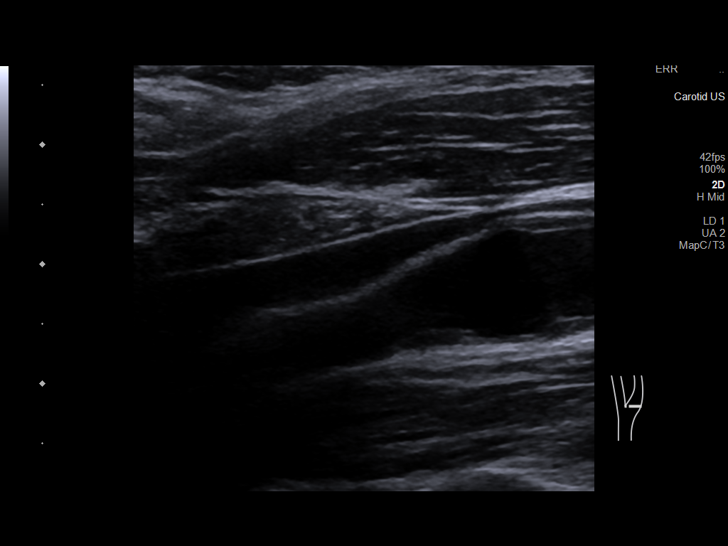
[im 63/69]
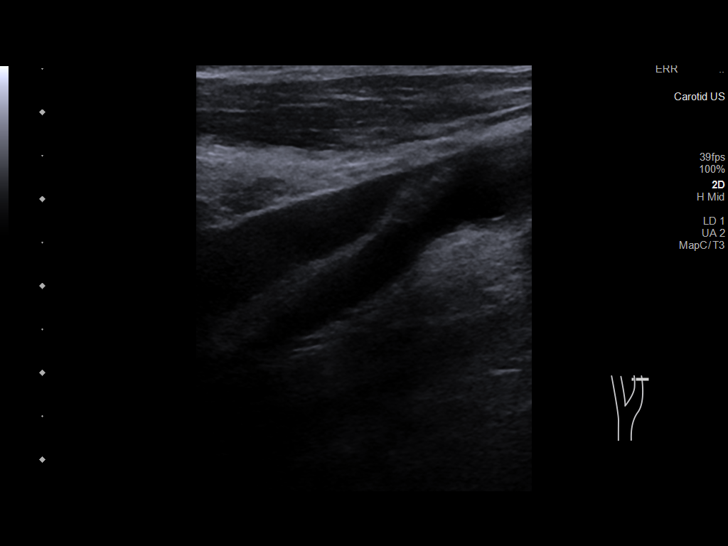
[im 69/69]
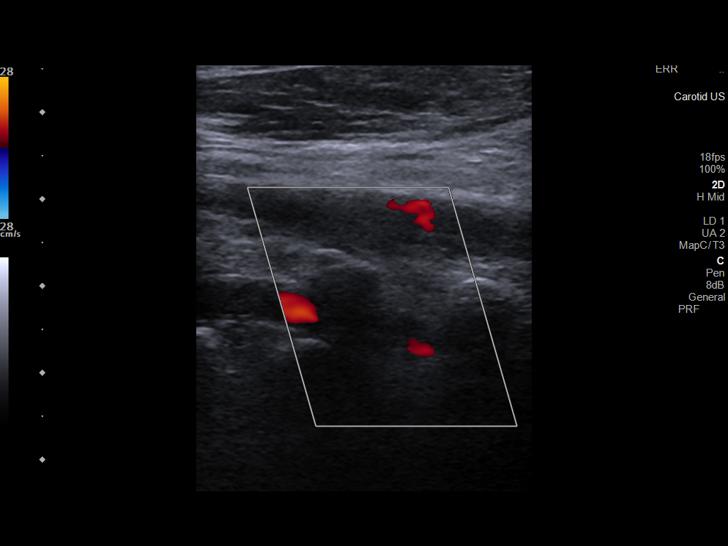

[13 of 24 positions shown; findings below may reference images not displayed]

FINDINGS: Criteria: Quantification of carotid stenosis is based on velocity
parameters that correlate the residual internal carotid diameter
with NASCET-based stenosis levels, using the diameter of the distal
internal carotid lumen as the denominator for stenosis measurement.

The following velocity measurements were obtained:

RIGHT

ICA: 77/28 cm/sec

CCA: 57/11 cm/sec

SYSTOLIC ICA/CCA RATIO:

ECA: 107 cm/sec

LEFT

ICA: 130/31 cm/sec

CCA: 93/21 cm/sec

SYSTOLIC ICA/CCA RATIO:

ECA: 119 cm/sec

RIGHT CAROTID ARTERY: Small amount of echogenic plaque at the right
carotid bulb and similar to the exam in [ZJ]. External carotid
artery is patent with normal waveform. Normal waveforms and
velocities in the internal carotid artery. Small amount of plaque in
the internal carotid artery.

RIGHT VERTEBRAL ARTERY: Antegrade flow and normal waveform in the
right vertebral artery.

LEFT CAROTID ARTERY: Intimal thickening and minimal plaque at the
left carotid bulb. External carotid artery is patent with normal
waveform. Small amount of heterogeneous plaque in the proximal
internal carotid artery. Normal waveforms and velocities in the
internal carotid artery.

LEFT VERTEBRAL ARTERY: Antegrade flow and normal waveform in the
left vertebral artery.
IMPRESSION: 1. Mild atherosclerotic disease involving bilateral carotid
arteries. Estimated degree of stenosis in the internal carotid
arteries is less than 50% bilaterally.
2. Patent vertebral arteries with antegrade flow.

## 2020-05-13 MED ORDER — NICOTINE 14 MG/24HR TD PT24: 14.0000 mg | MEDICATED_PATCH | TRANSDERMAL | 0 refills | Status: AC

## 2020-05-13 MED ORDER — ATORVASTATIN CALCIUM 40 MG PO TABS: 40.0000 mg | ORAL_TABLET | Freq: Every day | ORAL | 0 refills | Status: DC

## 2020-05-13 MED ORDER — ASPIRIN EC 81 MG PO TBEC: 81.0000 mg | DELAYED_RELEASE_TABLET | Freq: Every day | ORAL | 2 refills | Status: AC

## 2020-05-13 MED ORDER — ATORVASTATIN CALCIUM 40 MG PO TABS: 40.0000 mg | ORAL_TABLET | Freq: Every day | ORAL | Status: DC

## 2020-05-13 NOTE — Evaluation (Signed)
Physical Therapy Evaluation Patient Details Name: Frank Lin MRN: [id number] DOB: 09-07-1950 Today's Date: 05/13/2020   History of Present Illness  Frank Lin is a 70 y.o. male with a history of B12 deficiency, GERD, hyperlipidemia patient seen for left-sided upper and lower extremity weakness that started a few days ago.  Exact timing of onset is uncertain.  He has noticed some numbness in his left hand, which makes it difficult to grasp objects.  He also has weakness and difficulty walking.  He feels unsteady on his feet.  No palliating or provoking factors.   Clinical Impression    Patient very agreeable to therapy. Able to perform all bed mobilities and transfers with modified interdependence. At baseline patient does not use assistive device, but patient fearful of walking without AD and preferred walker over cane. Patient able to ambulate with supervision with walker and able to ascend and descend stairs with step to gait pattern with use of railing. Patient reported fatigue and notable increased dragging of left foot towards end of session.  Patient will continue to benefit from skilled physical therapy in hospital and recommended venue below to increase strength, balance, endurance for safe ADLs and gait.   Follow Up Recommendations Outpatient PT;Supervision - Intermittent    Equipment Recommendations  Rolling walker with 5" wheels    Recommendations for Other Services       Precautions / Restrictions Restrictions Weight Bearing Restrictions: No      Mobility  Bed Mobility Overal bed mobility: Independent                  Transfers Overall transfer level: Modified independent Equipment used: Rolling walker (2 wheeled)                Ambulation/Gait Ambulation/Gait assistance: Supervision Gait Distance (Feet): 120 Feet Assistive device: Rolling walker (2 wheeled) Gait Pattern/deviations: Decreased step length - right;Decreased stance time -  left;Decreased dorsiflexion - left Gait velocity: decreased   General Gait Details: cues tonot push walker out in front of him  Stairs            Wheelchair Mobility    Modified Rankin (Stroke Patients Only)       Balance Overall balance assessment: Modified Independent                                           Pertinent Vitals/Pain Pain Assessment: No/denies pain    Home Living Family/patient expects to be discharged to:: Private residence Living Arrangements: Children Available Help at Discharge: Family Type of Home: House Home Access: Stairs to enter Entrance Stairs-Rails: Right Entrance Stairs-Number of Steps: 8 Home Layout: One level Home Equipment: None Additional Comments: lives with daughter who works 7pm to [employer]    Prior Function Level of Independence: Independent               Journalist, newspaper        Extremity/Trunk Assessment   Upper Extremity Assessment Upper Extremity Assessment: Defer to OT evaluation    Lower Extremity Assessment Lower Extremity Assessment: LLE deficits/detail LLE Deficits / Details: 4+/5 throughout left lower leg but fatigues with activity LLE Coordination: decreased gross motor    Cervical / Trunk Assessment Cervical / Trunk Assessment: Normal  Communication   Communication: No difficulties  Cognition Arousal/Alertness: Awake/alert   Overall Cognitive Status: Within Functional Limits for tasks assessed  General Comments      Exercises Total Joint Exercises Ankle Circles/Pumps: AROM;Both;15 reps Long Arc Quad: AROM;Both;15 reps Marching in Standing: AROM;Both;10 reps   Assessment/Plan    PT Assessment Patient needs continued PT services  PT Problem List Decreased strength;Decreased range of motion;Decreased activity tolerance;Decreased balance;Decreased mobility;Decreased coordination       PT Treatment Interventions      PT  Goals (Current goals can be found in the Care Plan section)  Acute Rehab PT Goals Patient Stated Goal: to go home PT Goal Formulation: With patient/family Time For Goal Achievement: 05/27/20 Potential to Achieve Goals: Good    Frequency Min 3X/week   Barriers to discharge        Co-evaluation               AM-PAC PT "6 Clicks" Mobility  Outcome Measure Help needed turning from your back to your side while in a flat bed without using bedrails?: None Help needed moving from lying on your back to sitting on the side of a flat bed without using bedrails?: None Help needed moving to and from a bed to a chair (including a wheelchair)?: A Little Help needed standing up from a chair using your arms (e.g., wheelchair or bedside chair)?: None Help needed to walk in hospital room?: A Little Help needed climbing 3-5 steps with a railing? : A Little 6 Click Score: 21    End of Session Equipment Utilized During Treatment: Gait belt Activity Tolerance: Patient tolerated treatment well Patient left: in bed;with call bell/phone within reach;with family/visitor present Nurse Communication: Mobility status PT Visit Diagnosis: Unsteadiness on feet (R26.81);Muscle weakness (generalized) (M62.81);Difficulty in walking, not elsewhere classified (R26.2)    Time: 1000-1021 PT Time Calculation (min) (ACUTE ONLY): 21 min   Charges:   PT Evaluation $PT Eval Low Complexity: 1 Low PT Treatments $Therapeutic Exercise: 8-22 mins        11:13 AM, 05/13/20 Jerene Pitch, DPT Physical Therapy with [facility]  [phone] office

## 2020-05-13 NOTE — Plan of Care (Signed)
  Problem: Acute Rehab PT Goals(only PT should resolve) Goal: Pt Will Ambulate Flowsheets (Taken 05/13/2020 1118) Pt will Ambulate:  > 125 feet  with supervision  with cane Goal: Pt Will Go Up/Down Stairs Flowsheets (Taken 05/13/2020 1118) Pt will Go Up / Down Stairs:  6-9 stairs  with minimal assist  with rail(s) Note: With step to pattern   11:18 AM, 05/13/20 Jerene Pitch, DPT Physical Therapy with [facility]  [phone] office

## 2020-05-13 NOTE — Care Management Obs Status (Signed)
[name] NOTIFICATION   Patient Details  Name: Frank Lin MRN: [id number] Date of Birth: 1950/10/22   Medicare Observation Status Notification Given:  Yes    Natasha Bence, LCSW 05/13/2020, 3:31 PM

## 2020-05-13 NOTE — Progress Notes (Signed)
  Echocardiogram 2D Echocardiogram has been performed.  Darlina Sicilian M 05/13/2020, 2:24 PM

## 2020-05-13 NOTE — Progress Notes (Signed)
SLP Cancellation Note  Patient Details Name: Frank Lin MRN: [id number] DOB: 04-Aug-1950   Cancelled treatment:       Reason Eval/Treat Not Completed: SLP screened, no needs identified, will sign off. Pt has experienced no changes in speech or language; Pt is at cognitive baseline. ST will sign off at this time. Thank you,  [name], CCC-SLP Speech Language Pathologist    [name] 05/13/2020, 4:42 PM

## 2020-05-13 NOTE — Care Management CC44 (Signed)
Condition Code 44 Documentation Completed  Patient Details  Name: MYLIK PRO MRN: [id number] Date of Birth: [birth year]   Condition Code 44 given:  Yes Patient signature on Condition Code 44 notice:  Yes Documentation of 2 MD's agreement:  Yes Code 44 added to claim:  Yes    Natasha Bence, LCSW 05/13/2020, 3:31 PM

## 2020-05-14 ENCOUNTER — Other Ambulatory Visit: Payer: Self-pay | Admitting: Internal Medicine

## 2020-05-14 DIAGNOSIS — I639 Cerebral infarction, unspecified: Secondary | ICD-10-CM

## 2020-05-14 NOTE — Discharge Summary (Signed)
[facility] Discharge Summary   Patient: Frank Lin:[id number]  PCP: Sharilyn Sites, MD  Date of admission: 05/12/2020   Date of discharge: 05/13/2020      Discharge Diagnoses:  Principal diagnosis Acute thalamic CVA.  Active Problems:   Acute stroke due to ischemia [facility])   CVA (cerebral vascular accident) [facility])  Admitted From: Home Disposition:  Home   Recommendations for Outpatient Follow-up:  1. PCP: Follow-up with PCP in 1 week 2. Follow up LABS/TEST: None   Follow-up Information    Sharilyn Sites, MD. Schedule an appointment as soon as possible for a visit in 1 week(s).   Specialty: Family Medicine Contact information: [address] [place] [address] [phone]        [facility]. Schedule an appointment as soon as possible for a visit in 1 month(s).   Specialty: Neurology Contact information: [address] [place] [phone]             Discharge Instructions    Ambulatory referral to Neurology   Complete by: As directed    An appointment is requested in approximately: 4 weeks   Diet - low sodium heart healthy   Complete by: As directed    Increase activity slowly   Complete by: As directed       Diet recommendation: Cardiac diet  Activity: The patient is advised to gradually reintroduce usual activities, as tolerated  Discharge Condition: stable  Code Status: Full code   History of present illness: As per the H and P dictated on admission, "Frank Lin is a 70 y.o. male with a history of B12 deficiency, GERD, hyperlipidemia patient seen for left-sided upper and lower extremity weakness that started a few days ago.  Exact timing of onset is uncertain.  He has noticed some numbness in his left hand, which makes it difficult to grasp objects.  He also has weakness and difficulty walking.  He feels unsteady on his feet.  No palliating or provoking  factors.  Emergency Department Course: CT and MRI suggestive of stroke.  As onset was a few days ago, code stroke was not called."  Hospital Course:  Summary of his active problems in the hospital is as following. Acute stroke  right infarct Left vertebral artery, PCA stenosis CT negative for any hemorrhage. Questionable bilateral infarct. MRI brain shows multiple small acute right thalamic infarct no abnormality on the left. Small lacunar infarct in the left thalamus appears chronic. MRA no significant hemodynamic stenosis Carotid Doppler shows mild atherosclerotic disease without any significant stenosis 2D Echo shows preserved EF, no thrombus no vegetation no valvular abnormality LDL 75 HgbA1c 5.6 Passed swallowing evaluation. Patient supposed to be onaspirin 81 mg daily prior to admission, but not taking it. Now on aspirin 81 mg daily. Discussed with neurology who recommends to continue 81 mg aspirin. We will also order outpatient 14-day cardiac monitor. Patient counseled to be compliant with his antithrombotic medications Therapy recommendations: Outpatient PT Disposition: Home discharge  CAD. Patient is supposed to be on aspirin recommended to remain compliant with this medication.  Active smoker. Patient smokes a pack a day. Recommended him to quit smoking. Patient currently agreeable and will try nicotine patch.  B12 deficiency history. Continue B12 injection  On the day of the discharge the patient's vitals were stable, and no other acute medical condition were reported by patient. The patient was felt safe to be discharge at Home.  Consultants: none, phone discussion with  neurology Procedures: Echocardiogram  DISCHARGE MEDICATION: Allergies as of 05/13/2020   No Known Allergies     Medication List    STOP taking these medications   aspirin 81 MG tablet Replaced by: aspirin EC 81 MG tablet   ibuprofen 200 MG tablet Commonly known as: ADVIL     TAKE  these medications   aspirin EC 81 MG tablet Take 1 tablet (81 mg total) by mouth daily. Swallow whole. Replaces: aspirin 81 MG tablet   atorvastatin 40 MG tablet Commonly known as: LIPITOR Take 1 tablet (40 mg total) by mouth daily. What changed:   medication strength  how much to take   cyanocobalamin 1000 MCG/ML injection Commonly known as: (VITAMIN B-12) INJECT 1 ML INTRAMUSCULARLY ONCE MONTHLY.   nicotine 14 mg/24hr patch Commonly known as: NICODERM CQ - dosed in mg/24 hours Place 1 patch (14 mg total) onto the skin daily. Start taking on: May 17, 2020       Discharge Exam: Danley Danker Weights   05/12/20 2030  Weight: 82 kg   Vitals:   05/13/20 0634 05/13/20 1412  BP: 135/81 (!) 164/82  Pulse: 84 72  Resp: 16   Temp: 98 F (36.7 C) 98.3 F (36.8 C)  SpO2: 96% 98%   General: Appear in no distress, no Rash; Oral Mucosa Clear, moist. no Abnormal Neck Mass Or lumps, Conjunctiva normal  Cardiovascular: S1 and S2 Present, no Murmur Respiratory: good respiratory effort, Bilateral Air entry present and CTA, no Crackles, no wheezes Abdomen: Bowel Sound present, Soft and no tenderness Extremities: no Pedal edema Neurology: alert and oriented to time, place, and person affect appropriate. no new focal deficit  The results of significant diagnostics from this hospitalization (including imaging, microbiology, ancillary and laboratory) are listed below for reference.    Significant Diagnostic Studies: CT Head Wo Contrast  Result Date: 05/12/2020 CLINICAL DATA:  Left arm and leg numbness EXAM: CT HEAD WITHOUT CONTRAST TECHNIQUE: Contiguous axial images were obtained from the base of the skull through the vertex without intravenous contrast. COMPARISON:  None. FINDINGS: Brain: There is no acute intracranial hemorrhage, mass effect, or edema. Gray-white differentiation is preserved. Age-indeterminate infarct of the posterior left thalamus. Questionable small hypodense focus in the  lateral right thalamus. There is no extra-axial fluid collection. Ventricles and sulci are within normal limits in size and configuration. Vascular: No hyperdense vessel.There is atherosclerotic calcification at the skull base. Skull: Calvarium is unremarkable. Sinuses/Orbits: No acute finding. Other: None. IMPRESSION: No acute intracranial hemorrhage. Age-indeterminate left thalamic infarct. Questionable additional age-indeterminate right thalamic infarct. Electronically Signed   By: Macy Mis M.D.   On: 05/12/2020 14:17   MR ANGIO HEAD WO CONTRAST  Result Date: 05/12/2020 CLINICAL DATA:  Stroke follow-up. EXAM: MRI HEAD WITHOUT CONTRAST MRA HEAD WITHOUT CONTRAST MRA NECK WITHOUT CONTRAST TECHNIQUE: Multiplanar, multiecho pulse sequences of the brain and surrounding structures were obtained without intravenous contrast. Angiographic images of the Circle of Willis were obtained using MRA technique without intravenous contrast. Angiographic images of the neck were obtained using MRA technique without intravenous contrast. Carotid stenosis measurements (when applicable) are obtained utilizing NASCET criteria, using the distal internal carotid diameter as the denominator. COMPARISON:  Same day head ct FINDINGS: MRI HEAD FINDINGS Brain: Multiple small acute right thalamocapsular infarcts. Associated edema without mass effect. No acute hemorrhage. No mass lesion. No extra-axial fluid collection. No hydrocephalus. Mild for age scattered T2/FLAIR hyperintensities within the white matter, likely related to chronic microvascular ischemic disease. Prior small lacunar infarcts  in the left thalamus. No midline shift. Basal cisterns are patent. Skull and upper cervical spine: Normal marrow signal. Sinuses/Orbits: Sinuses are largely clear.  Unremarkable orbits. Other: Moderate bilateral mastoid effusions. MRA HEAD FINDINGS Anterior circulation: No evidence of large vessel occlusion. Mild-to-moderate stenosis of the left  M1 MCA with approximately 40% narrowing. There is a small (2.5 mm) anterosuperiorly directed outpouching of the right paraclinoid ICA (series 100, image 116), suspicious for aneurysm. Posterior circulation: Intradural vertebral arteries and basilar artery are patent without evidence of significant stenosis severe stenosis of the right P2 PCA (series 100, image 125). Moderate to severe stenosis of the left P2 PCA (series 100, image 138. There is flow related signal in the more distal PCAs bilaterally. MRA NECK FINDINGS Carotids: There is bilateral carotid bifurcation narrowing likely related to atherosclerosis without evidence of greater than 50% narrowing. Vertebral arteries: Codominant. Suspected mild to moderate left vertebral artery origin stenosis, although evaluation is limited proximally. Otherwise, the vertebral arteries are patent without hemodynamically significant stenosis. IMPRESSION: MRI head: 1. Multiple small acute RIGHT thalamocapsular infarcts. Associated edema without mass effect. 2. Small remote lacunar infarct in the LEFT thalamus and mild for age chronic microvascular ischemic disease. MRA: 1. 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 2. Mild to moderate stenosis of the proximal left M1 MCA with approximately 40% narrowing. 3. 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 Possible mild to moderate stenosis of the left vertebral artery origin, although evaluation is limited proximally in the neck. 2. Otherwise, no evidence of hemodynamically significant stenosis in the neck. Findings discussed with Dr. Laverta Baltimore Via telephone at 5:00 PM. Electronically Signed   By: Margaretha Sheffield MD   On: 05/12/2020 17:06   MR Angiogram Neck W or Wo Contrast  Result Date: 05/12/2020 CLINICAL DATA:  Stroke follow-up. EXAM: MRI HEAD WITHOUT CONTRAST MRA HEAD WITHOUT CONTRAST MRA NECK WITHOUT CONTRAST TECHNIQUE: Multiplanar, multiecho pulse sequences of the brain and  surrounding structures were obtained without intravenous contrast. Angiographic images of the Circle of Willis were obtained using MRA technique without intravenous contrast. Angiographic images of the neck were obtained using MRA technique without intravenous contrast. Carotid stenosis measurements (when applicable) are obtained utilizing NASCET criteria, using the distal internal carotid diameter as the denominator. COMPARISON:  Same day head ct FINDINGS: MRI HEAD FINDINGS Brain: Multiple small acute right thalamocapsular infarcts. Associated edema without mass effect. No acute hemorrhage. No mass lesion. No extra-axial fluid collection. No hydrocephalus. Mild for age scattered T2/FLAIR hyperintensities within the white matter, likely related to chronic microvascular ischemic disease. Prior small lacunar infarcts in the left thalamus. No midline shift. Basal cisterns are patent. Skull and upper cervical spine: Normal marrow signal. Sinuses/Orbits: Sinuses are largely clear.  Unremarkable orbits. Other: Moderate bilateral mastoid effusions. MRA HEAD FINDINGS Anterior circulation: No evidence of large vessel occlusion. Mild-to-moderate stenosis of the left M1 MCA with approximately 40% narrowing. There is a small (2.5 mm) anterosuperiorly directed outpouching of the right paraclinoid ICA (series 100, image 116), suspicious for aneurysm. Posterior circulation: Intradural vertebral arteries and basilar artery are patent without evidence of significant stenosis severe stenosis of the right P2 PCA (series 100, image 125). Moderate to severe stenosis of the left P2 PCA (series 100, image 138. There is flow related signal in the more distal PCAs bilaterally. MRA NECK FINDINGS Carotids: There is bilateral carotid bifurcation narrowing likely related to atherosclerosis without evidence of greater than 50% narrowing. Vertebral arteries: Codominant.  Suspected mild to moderate left vertebral artery origin stenosis, although  evaluation is limited proximally. Otherwise, the vertebral arteries are patent without hemodynamically significant stenosis. IMPRESSION: MRI head: 1. Multiple small acute RIGHT thalamocapsular infarcts. Associated edema without mass effect. 2. Small remote lacunar infarct in the LEFT thalamus and mild for age chronic microvascular ischemic disease. MRA: 1. 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 2. Mild to moderate stenosis of the proximal left M1 MCA with approximately 40% narrowing. 3. 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 Possible mild to moderate stenosis of the left vertebral artery origin, although evaluation is limited proximally in the neck. 2. Otherwise, no evidence of hemodynamically significant stenosis in the neck. Findings discussed with Dr. Laverta Baltimore Via telephone at 5:00 PM. Electronically Signed   By: Margaretha Sheffield MD   On: 05/12/2020 17:06   MR BRAIN WO CONTRAST  Result Date: 05/12/2020 CLINICAL DATA:  Stroke follow-up. EXAM: MRI HEAD WITHOUT CONTRAST MRA HEAD WITHOUT CONTRAST MRA NECK WITHOUT CONTRAST TECHNIQUE: Multiplanar, multiecho pulse sequences of the brain and surrounding structures were obtained without intravenous contrast. Angiographic images of the Circle of Willis were obtained using MRA technique without intravenous contrast. Angiographic images of the neck were obtained using MRA technique without intravenous contrast. Carotid stenosis measurements (when applicable) are obtained utilizing NASCET criteria, using the distal internal carotid diameter as the denominator. COMPARISON:  Same day head ct FINDINGS: MRI HEAD FINDINGS Brain: Multiple small acute right thalamocapsular infarcts. Associated edema without mass effect. No acute hemorrhage. No mass lesion. No extra-axial fluid collection. No hydrocephalus. Mild for age scattered T2/FLAIR hyperintensities within the white matter, likely related to chronic microvascular  ischemic disease. Prior small lacunar infarcts in the left thalamus. No midline shift. Basal cisterns are patent. Skull and upper cervical spine: Normal marrow signal. Sinuses/Orbits: Sinuses are largely clear.  Unremarkable orbits. Other: Moderate bilateral mastoid effusions. MRA HEAD FINDINGS Anterior circulation: No evidence of large vessel occlusion. Mild-to-moderate stenosis of the left M1 MCA with approximately 40% narrowing. There is a small (2.5 mm) anterosuperiorly directed outpouching of the right paraclinoid ICA (series 100, image 116), suspicious for aneurysm. Posterior circulation: Intradural vertebral arteries and basilar artery are patent without evidence of significant stenosis severe stenosis of the right P2 PCA (series 100, image 125). Moderate to severe stenosis of the left P2 PCA (series 100, image 138. There is flow related signal in the more distal PCAs bilaterally. MRA NECK FINDINGS Carotids: There is bilateral carotid bifurcation narrowing likely related to atherosclerosis without evidence of greater than 50% narrowing. Vertebral arteries: Codominant. Suspected mild to moderate left vertebral artery origin stenosis, although evaluation is limited proximally. Otherwise, the vertebral arteries are patent without hemodynamically significant stenosis. IMPRESSION: MRI head: 1. Multiple small acute RIGHT thalamocapsular infarcts. Associated edema without mass effect. 2. Small remote lacunar infarct in the LEFT thalamus and mild for age chronic microvascular ischemic disease. MRA: 1. 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 2. Mild to moderate stenosis of the proximal left M1 MCA with approximately 40% narrowing. 3. 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 Possible mild to moderate stenosis of the left vertebral artery origin, although evaluation is limited proximally in the neck. 2. Otherwise, no evidence of hemodynamically significant stenosis  in the neck. Findings discussed with Dr. Laverta Baltimore Via telephone at 5:00 PM. Electronically Signed   By: Margaretha Sheffield MD   On: 05/12/2020 17:06  US Carotid Bilateral  Result Date: 05/13/2020 CLINICAL DATA:  Stroke. EXAM: BILATERAL CAROTID DUPLEX ULTRASOUND TECHNIQUE: Pearline Cables scale imaging, color Doppler and duplex ultrasound were performed of bilateral carotid and vertebral arteries in the neck. COMPARISON:  11/21/2011 and MRA examination 05/12/2020 FINDINGS: Criteria: Quantification of carotid stenosis is based on velocity parameters that correlate the residual internal carotid diameter with NASCET-based stenosis levels, using the diameter of the distal internal carotid lumen as the denominator for stenosis measurement. The following velocity measurements were obtained: RIGHT ICA: 77/28 cm/sec CCA: 43/15 cm/sec SYSTOLIC ICA/CCA RATIO:  1.3 ECA: 107 cm/sec LEFT ICA: 130/31 cm/sec CCA: 40/08 cm/sec SYSTOLIC ICA/CCA RATIO:  1.4 ECA: 119 cm/sec RIGHT CAROTID ARTERY: Small amount of echogenic plaque at the right carotid bulb and similar to the exam in 2013. External carotid artery is patent with normal waveform. Normal waveforms and velocities in the internal carotid artery. Small amount of plaque in the internal carotid artery. RIGHT VERTEBRAL ARTERY: Antegrade flow and normal waveform in the right vertebral artery. LEFT CAROTID ARTERY: Intimal thickening and minimal plaque at the left carotid bulb. External carotid artery is patent with normal waveform. Small amount of heterogeneous plaque in the proximal internal carotid artery. Normal waveforms and velocities in the internal carotid artery. LEFT VERTEBRAL ARTERY: Antegrade flow and normal waveform in the left vertebral artery. IMPRESSION: 1. Mild atherosclerotic disease involving bilateral carotid arteries. Estimated degree of stenosis in the internal carotid arteries is less than 50% bilaterally. 2. Patent vertebral arteries with antegrade flow. Electronically  Signed   By: Markus Daft M.D.   On: 05/13/2020 12:30   ECHOCARDIOGRAM COMPLETE  Result Date: 05/13/2020    ECHOCARDIOGRAM REPORT   Patient Name:   Frank Lin Date of Exam: 05/13/2020 Medical Rec #:  [id number]      Height:       69.0 in Accession #:    [id number]     Weight:       180.8 lb Date of Birth:  Oct 18, 1950      BSA:          1.979 m Patient Age:    70 years       BP:           164/82 mmHg Patient Gender: M              HR:           72 bpm. Exam Location:  Inpatient Procedure: 2D Echo, Cardiac Doppler and Color Doppler Indications:    Stroke I63.9  History:        Patient has no prior history of Echocardiogram examinations.                 Risk Factors:Dyslipidemia and Former Smoker. GERD.  Sonographer:    Darlina Sicilian RDCS Referring Phys: [place]  1. Left ventricular ejection fraction, by estimation, is 50 to 55%. The left ventricle has low normal function. The left ventricle has no regional wall motion abnormalities. Left ventricular diastolic parameters are consistent with Grade I diastolic dysfunction (impaired relaxation).  2. Right ventricular systolic function is normal. The right ventricular size is normal.  3. The mitral valve is normal in structure. No evidence of mitral valve regurgitation. No evidence of mitral stenosis.  4. The aortic valve is tricuspid. Aortic valve regurgitation is not visualized. No aortic stenosis is present.  5. Aortic dilatation noted. There is borderline dilatation of the aortic root, measuring 39 mm.  6. The inferior  vena cava is normal in size with greater than 50% respiratory variability, suggesting right atrial pressure of 3 mmHg. FINDINGS  Left Ventricle: Left ventricular ejection fraction, by estimation, is 50 to 55%. The left ventricle has low normal function. The left ventricle has no regional wall motion abnormalities. The left ventricular internal cavity size was normal in size. There is no left ventricular hypertrophy. Left  ventricular diastolic parameters are consistent with Grade I diastolic dysfunction (impaired relaxation). Right Ventricle: The right ventricular size is normal.Right ventricular systolic function is normal. Left Atrium: Left atrial size was normal in size. Right Atrium: Right atrial size was normal in size. Pericardium: There is no evidence of pericardial effusion. Mitral Valve: The mitral valve is normal in structure. No evidence of mitral valve regurgitation. No evidence of mitral valve stenosis. Tricuspid Valve: The tricuspid valve is normal in structure. Tricuspid valve regurgitation is trivial. No evidence of tricuspid stenosis. Aortic Valve: The aortic valve is tricuspid. Aortic valve regurgitation is not visualized. No aortic stenosis is present. Pulmonic Valve: The pulmonic valve was not well visualized. Pulmonic valve regurgitation is not visualized. No evidence of pulmonic stenosis. Aorta: Aortic dilatation noted. There is borderline dilatation of the aortic root, measuring 39 mm. Venous: The inferior vena cava is normal in size with greater than 50% respiratory variability, suggesting right atrial pressure of 3 mmHg. IAS/Shunts: No atrial level shunt detected by color flow Doppler.  LEFT VENTRICLE PLAX 2D LVIDd:         4.68 cm     Diastology LVIDs:         4.00 cm     LV e' medial:    4.70 cm/s LV PW:         1.01 cm     LV E/e' medial:  12.8 LV IVS:        0.95 cm     LV e' lateral:   6.89 cm/s LVOT diam:     2.00 cm     LV E/e' lateral: 8.7 LV SV:         49 LV SV Index:   25 LVOT Area:     3.14 cm  LV Volumes (MOD) LV vol d, MOD A2C: 89.4 ml LV vol d, MOD A4C: 59.5 ml LV vol s, MOD A2C: 43.4 ml LV vol s, MOD A4C: 29.4 ml LV SV MOD A2C:     46.0 ml LV SV MOD A4C:     59.5 ml LV SV MOD BP:      39.9 ml RIGHT VENTRICLE RV S prime:     10.10 cm/s TAPSE (M-mode): 1.3 cm LEFT ATRIUM             Index       RIGHT ATRIUM           Index LA diam:        4.10 cm 2.07 cm/m  RA Area:     10.50 cm LA Vol (A2C):    28.4 ml 14.35 ml/m RA Volume:   21.30 ml  10.76 ml/m LA Vol (A4C):   29.3 ml 14.80 ml/m LA Biplane Vol: 30.3 ml 15.31 ml/m  AORTIC VALVE LVOT Vmax:   77.70 cm/s LVOT Vmean:  53.700 cm/s LVOT VTI:    0.156 m  AORTA Ao Root diam: 3.90 cm MITRAL VALVE MV Area (PHT): 2.86 cm    SHUNTS MV Decel Time: 265 msec    Systemic VTI:  0.16 m MV E velocity: 60.00 cm/s  Systemic Diam:  2.00 cm MV A velocity: 78.80 cm/s MV E/A ratio:  0.76 [name] MD Electronically signed by [name] MD Signature Date/Time: 05/13/2020/2:38:12 PM    Final     Microbiology: Recent Results (from the past 240 hour(s))  Resp Panel by RT-PCR (Flu A&B, Covid) Nasopharyngeal Swab     Status: None   Collection Time: 05/12/20  3:15 PM   Specimen: Nasopharyngeal Swab; Nasopharyngeal(NP) swabs in vial transport medium  Result Value Ref Range Status   SARS Coronavirus 2 by RT PCR NEGATIVE NEGATIVE Final    Comment: (NOTE) SARS-CoV-2 target nucleic acids are NOT DETECTED.  The SARS-CoV-2 RNA is generally detectable in upper respiratory specimens during the acute phase of infection. The lowest concentration of SARS-CoV-2 viral copies this assay can detect is 138 copies/mL. A negative result does not preclude SARS-Cov-2 infection and should not be used as the sole basis for treatment or other patient management decisions. A negative result may occur with  improper specimen collection/handling, submission of specimen other than nasopharyngeal swab, presence of viral mutation(s) within the areas targeted by this assay, and inadequate number of viral copies(<138 copies/mL). A negative result must be combined with clinical observations, patient history, and epidemiological information. The expected result is Negative.  Fact Sheet for Patients:  EntrepreneurPulse.com.au  Fact Sheet for Healthcare Providers:  IncredibleEmployment.be  This test is no t yet approved or cleared by the  [place] FDA and  has been authorized for detection and/or diagnosis of SARS-CoV-2 by FDA under an Emergency Use Authorization (EUA). This EUA will remain  in effect (meaning this test can be used) for the duration of the COVID-19 declaration under Section 564(b)(1) of the Act, 21 U.S.C.section 360bbb-3(b)(1), unless the authorization is terminated  or revoked sooner.       Influenza A by PCR NEGATIVE NEGATIVE Final   Influenza B by PCR NEGATIVE NEGATIVE Final    Comment: (NOTE) The Xpert Xpress SARS-CoV-2/FLU/RSV plus assay is intended as an aid in the diagnosis of influenza from Nasopharyngeal swab specimens and should not be used as a sole basis for treatment. Nasal washings and aspirates are unacceptable for Xpert Xpress SARS-CoV-2/FLU/RSV testing.  Fact Sheet for Patients: [employer]  Fact Sheet for Healthcare Providers: IncredibleEmployment.be  This test is not yet approved or cleared by the [place] FDA and has been authorized for detection and/or diagnosis of SARS-CoV-2 by FDA under an Emergency Use Authorization (EUA). This EUA will remain in effect (meaning this test can be used) for the duration of the COVID-19 declaration under Section 564(b)(1) of the Act, 21 U.S.C. section 360bbb-3(b)(1), unless the authorization is terminated or revoked.  Performed at [facility], [address], [place], [place] [address]      Labs: CBC: Recent Labs  Lab 05/12/20 1443 05/12/20 1533  WBC 9.9  --   NEUTROABS 7.3  --   HGB 17.4* 17.3*  HCT 51.7 51.0  MCV 89.1  --   PLT 175  --    Basic Metabolic Panel: Recent Labs  Lab 05/12/20 1443 05/12/20 1533  NA 140 139  K 5.0 4.5  CL 104 104  CO2 28  --   GLUCOSE 109* 103*  BUN 9 10  CREATININE 1.06 0.90  CALCIUM 9.9  --    Liver Function Tests: Recent Labs  Lab 05/12/20 1443  AST 15  ALT 12  ALKPHOS 91  BILITOT 1.0  PROT 7.7  ALBUMIN 4.0   CBG: No  results for input(s): GLUCAP in the  last 168 hours.  Time spent: 35 minutes  Signed:  Berle Mull  [facility] 05/13/2020 5:05 PM

## 2020-05-15 ENCOUNTER — Ambulatory Visit (HOSPITAL_COMMUNITY)
Admission: RE | Admit: 2020-05-15 | Discharge: 2020-05-15 | Disposition: A | Discharge status: Home / Self Care | Payer: Medicare HMO | Source: Ambulatory Visit | Attending: Family Medicine | Admitting: Family Medicine

## 2020-05-15 ENCOUNTER — Other Ambulatory Visit: Payer: Self-pay

## 2020-05-15 DIAGNOSIS — I714 Abdominal aortic aneurysm, without rupture: Secondary | ICD-10-CM | POA: Diagnosis not present

## 2020-05-15 DIAGNOSIS — Z122 Encounter for screening for malignant neoplasm of respiratory organs: Secondary | ICD-10-CM | POA: Diagnosis not present

## 2020-05-15 DIAGNOSIS — F17290 Nicotine dependence, other tobacco product, uncomplicated: Secondary | ICD-10-CM | POA: Insufficient documentation

## 2020-05-15 DIAGNOSIS — I700 Atherosclerosis of aorta: Secondary | ICD-10-CM | POA: Insufficient documentation

## 2020-05-15 DIAGNOSIS — F17210 Nicotine dependence, cigarettes, uncomplicated: Secondary | ICD-10-CM | POA: Diagnosis not present

## 2020-05-15 DIAGNOSIS — J432 Centrilobular emphysema: Secondary | ICD-10-CM | POA: Diagnosis not present

## 2020-05-15 DIAGNOSIS — I2510 Atherosclerotic heart disease of native coronary artery without angina pectoris: Secondary | ICD-10-CM | POA: Diagnosis not present

## 2020-05-15 DIAGNOSIS — Z87891 Personal history of nicotine dependence: Secondary | ICD-10-CM

## 2020-05-15 IMAGING — US US AORTA
1 series · 13 of 25 positions shown · non-contrast
Comparison: Aortic ultrasound - [DATE]; [DATE]

CLINICAL DATA: Follow-up abdominal aortic aneurysm from [DATE].

EXAM:
ULTRASOUND OF ABDOMINAL AORTA
TECHNIQUE: Ultrasound examination of the abdominal aorta and proximal common
iliac arteries was performed to evaluate for aneurysm. Additional
color and Doppler images of the distal aorta were obtained to
document patency.

[Series 1: us aorta · 13 of 30 slices shown]
[im 1/30]
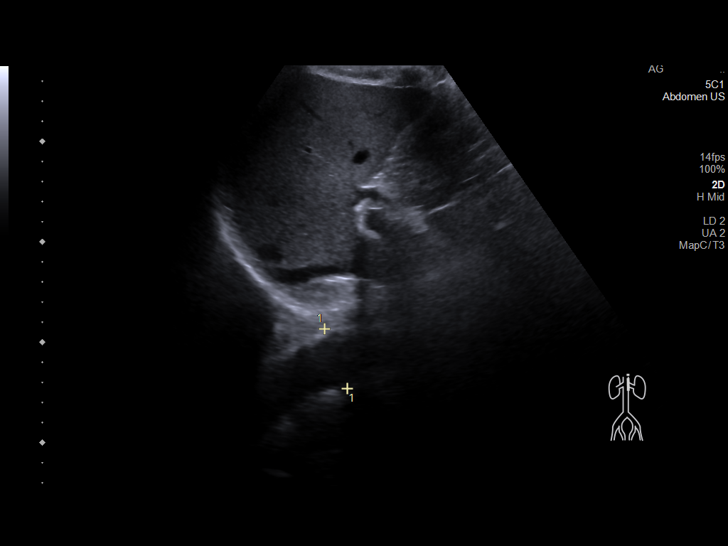
[im 3/30]
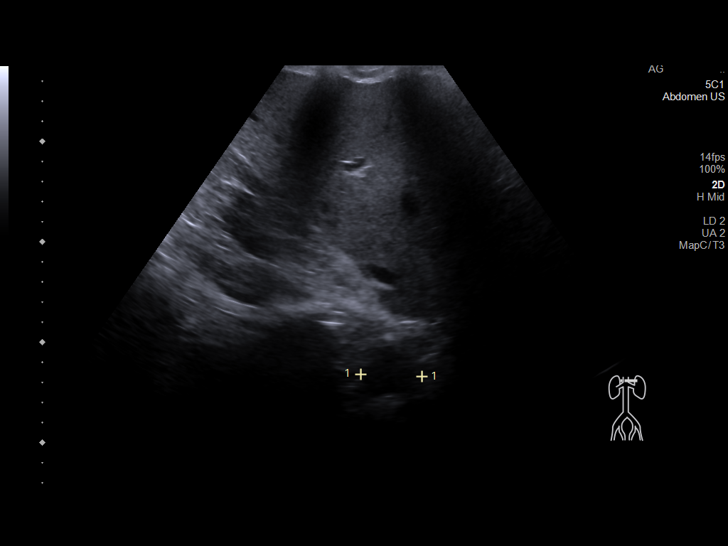
[im 5/30]
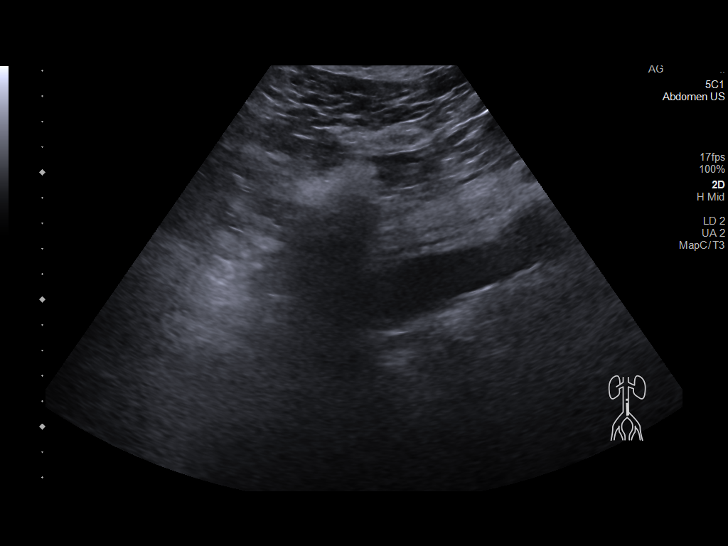
[im 8/30]
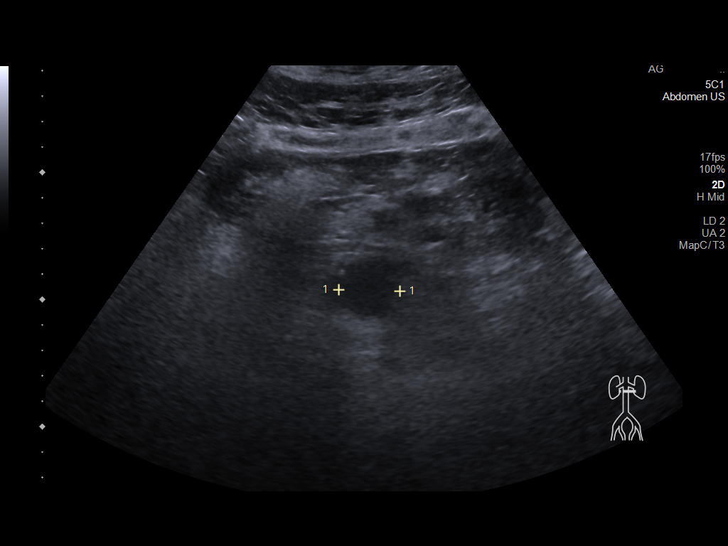
[im 10/30]
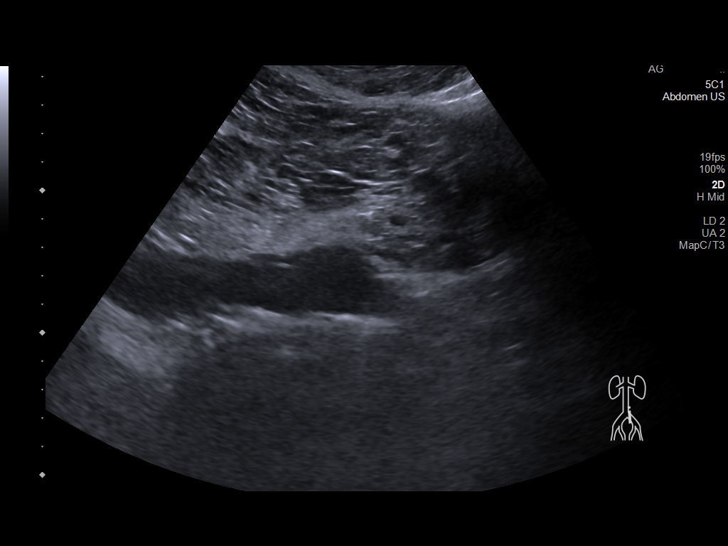
[im 13/30]
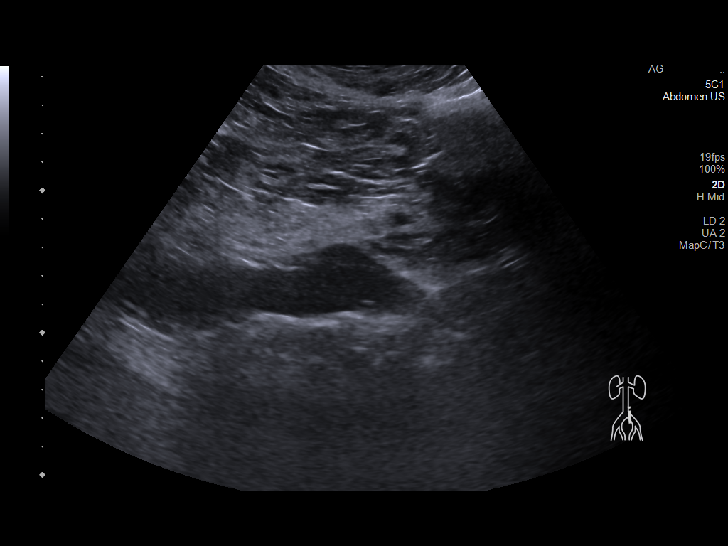
[im 15/30]
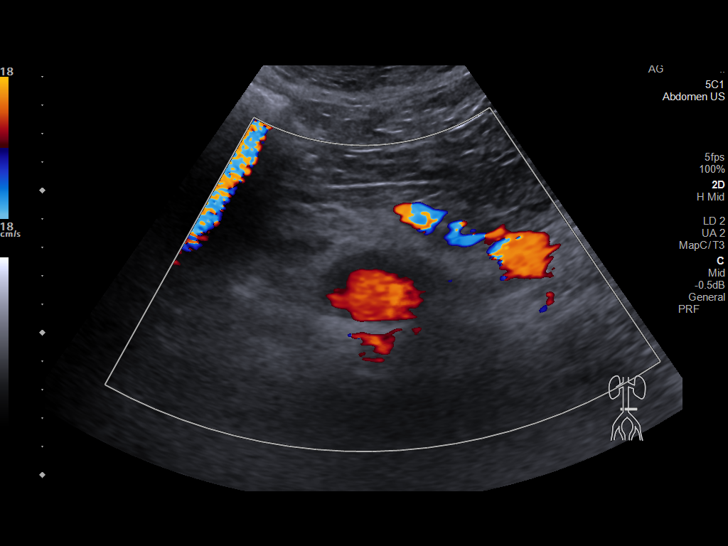
[im 17/30]
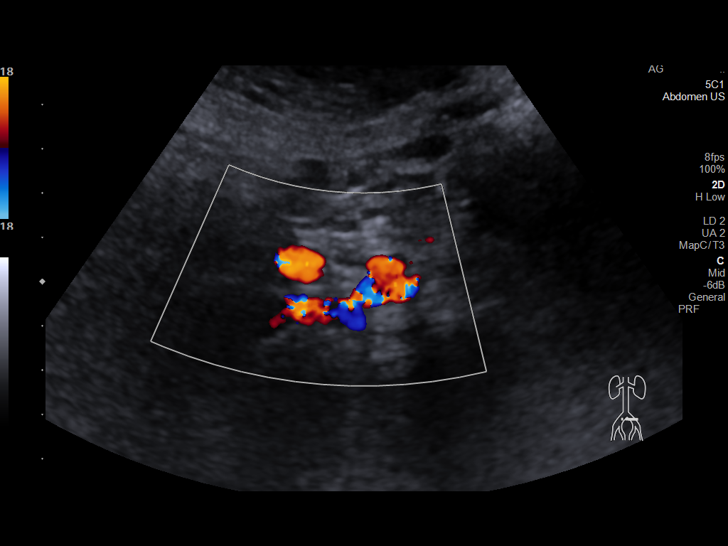
[im 20/30]
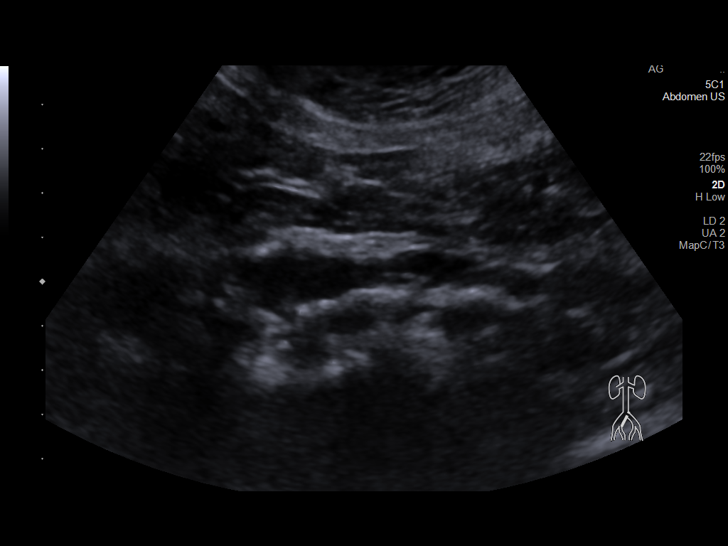
[im 22/30]
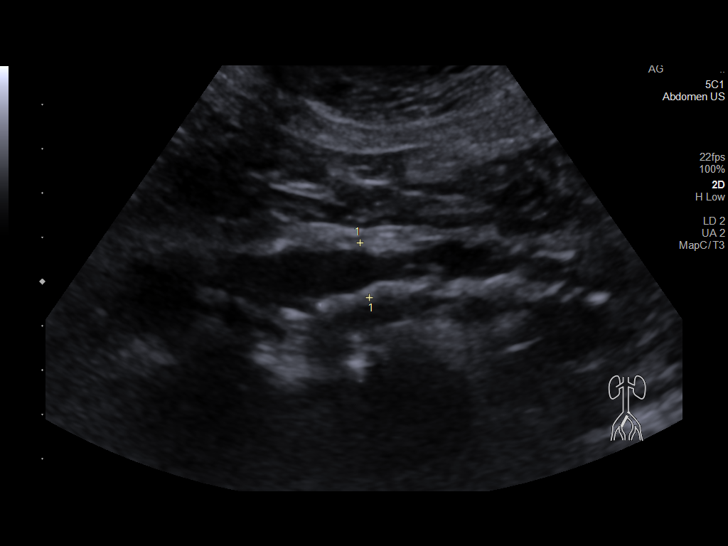
[im 25/30]
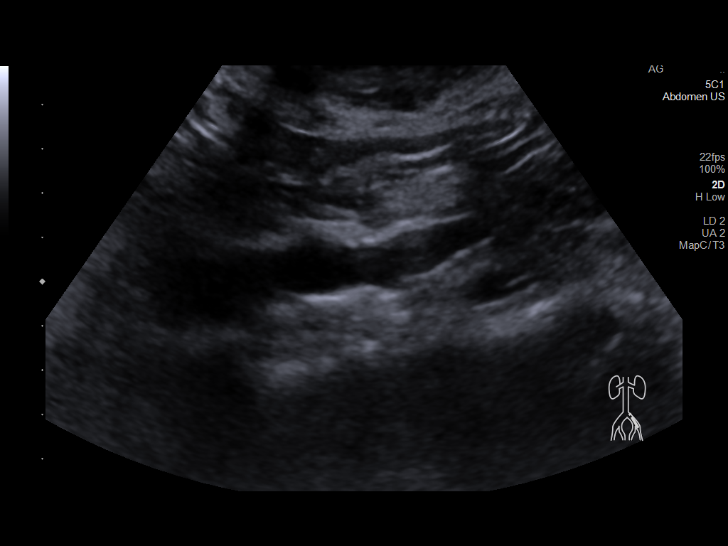
[im 27/30]
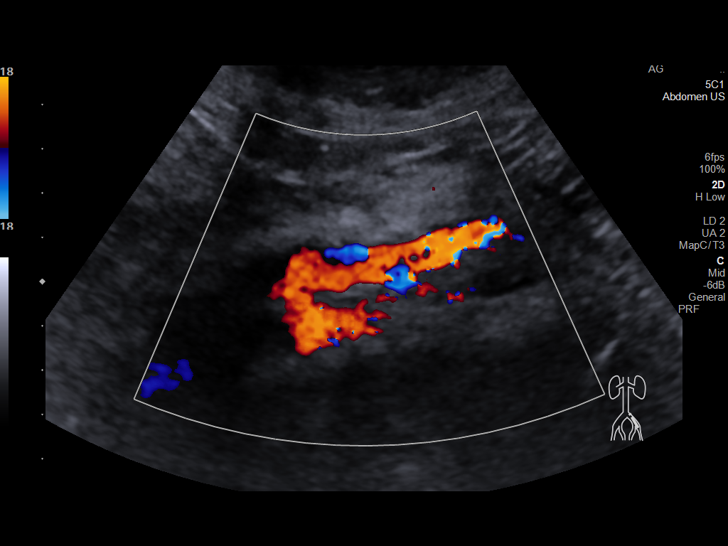
[im 30/30]
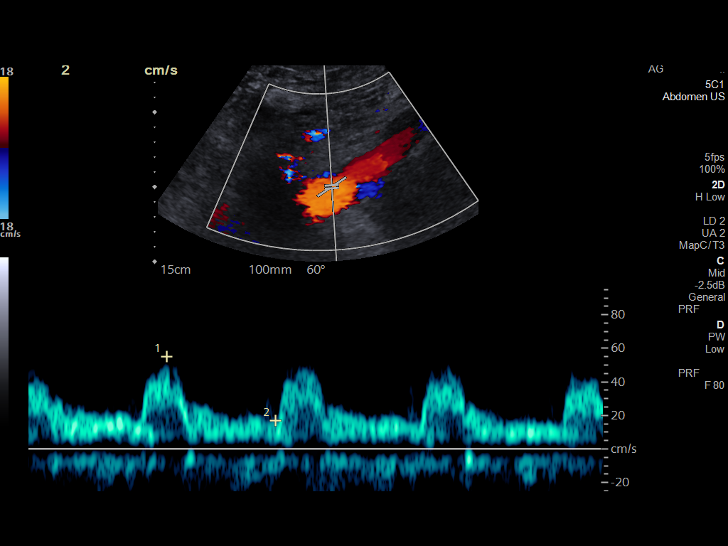

[13 of 25 positions shown; findings below may reference images not displayed]

FINDINGS: Abdominal aortic measurements as follows:

Proximal:  3.2 x 3.1 cm

Mid:  2.5 x 2.4 cm

Distal: 3.5 x 3.3 cm, previously, 2.6 x 2.4 cm when compared to the
[QT] examination

Patent: Yes, peak systolic velocity is 55 cm/s

There is a minimal amount of hypoechoic plaque/mural thrombus within
the dominant component of the infrarenal abdominal aortic aneurysm
(representative image 17).

Right common iliac artery: 1.3 x 1.2 cm

Left common iliac artery: 1.2 x 1.1 cm

There is a moderate amount of eccentric echogenic plaque scattered
throughout the bilateral common iliac arteries, right greater than
left (right - image 24; left - image 26).
IMPRESSION: 1. Interval increase in size of infrarenal abdominal aortic
aneurysm, now measuring 3.5 cm, previously, 2.6 cm (when compared to
the [QT] examination). Recommend follow-up aortic ultrasound in 2
years. This recommendation follows ACR consensus guidelines: White
Paper of the ACR Incidental Findings Committee II on Vascular
Findings. [HOSPITAL] [QT]; [DATE]. Aortic aneurysm NOS
([QT]-[QT]).
2. Moderate amount of atherosclerotic plaque involving the bilateral
common iliac arteries, right greater than left. Correlation for
symptoms of PAD is advised. Aortic Atherosclerosis ([QT]-[QT]).

## 2020-05-15 IMAGING — CT CT CHEST LUNG CANCER SCREENING LOW DOSE W/O CM
2 of 3 series · 15 of 36 positions shown, 18 images · non-contrast
Comparison: Low-dose lung cancer screening chest CT [DATE].

CLINICAL DATA: 70-year-old male current smoker with 43 pack-year
history of smoking. Lung cancer screening examination.

EXAM:
CT CHEST WITHOUT CONTRAST LOW-DOSE FOR LUNG CANCER SCREENING
TECHNIQUE: Multidetector CT imaging of the chest was performed following the
standard protocol without IV contrast.

[Series 2: axial st · axial · 0.67mm/px · z∈[+1199,+1489]mm · 12 of 68 slices shown, 15 images]
[im 5/68  mediastinal]
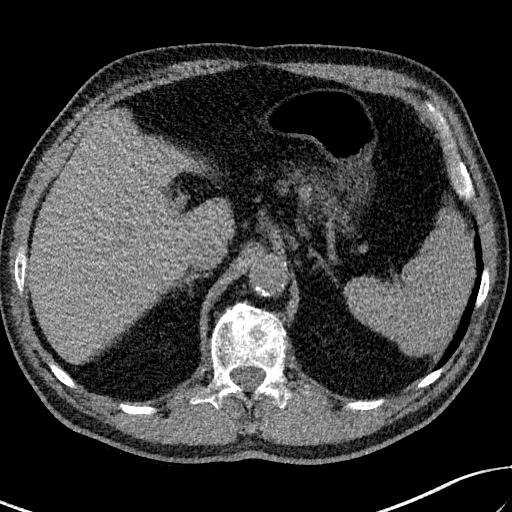
[im 5/68  lung]
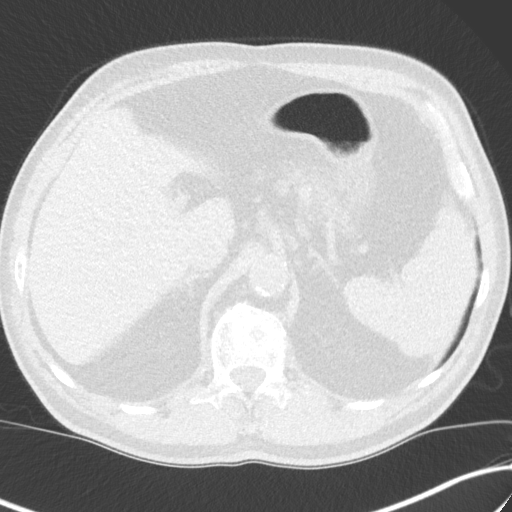
[im 10/68  lung]
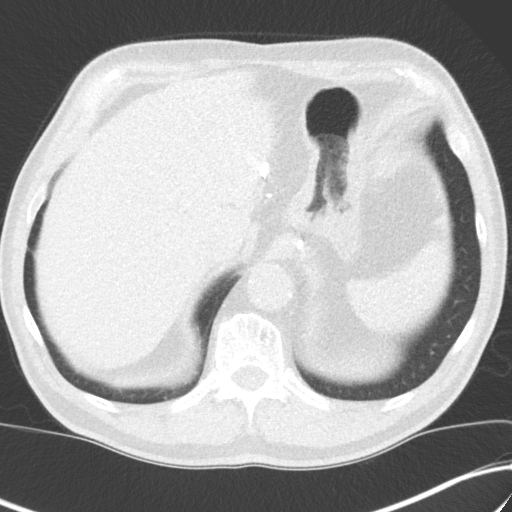
[im 15/68  lung]
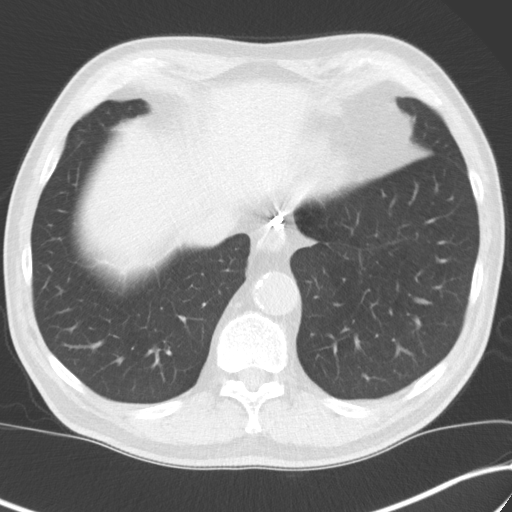
[im 20/68  lung]
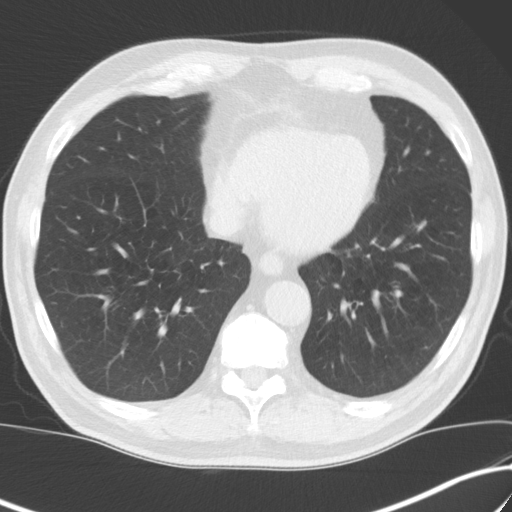
[im 25/68  mediastinal]
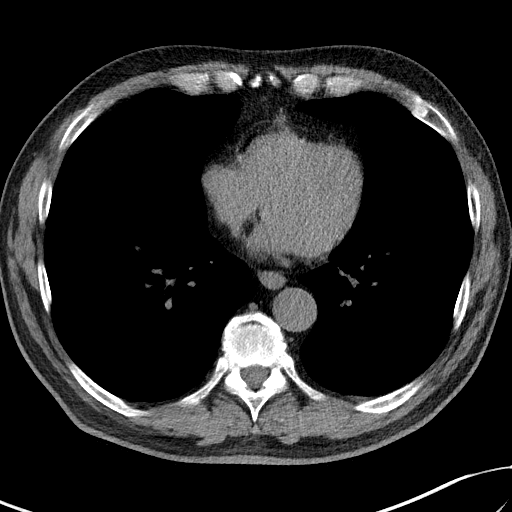
[im 25/68  lung]
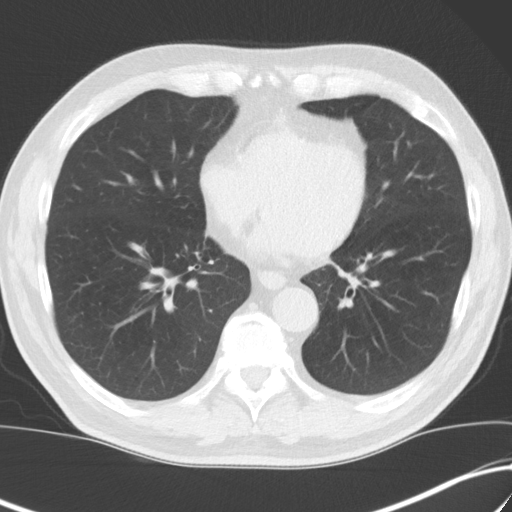
[im 30/68  lung]
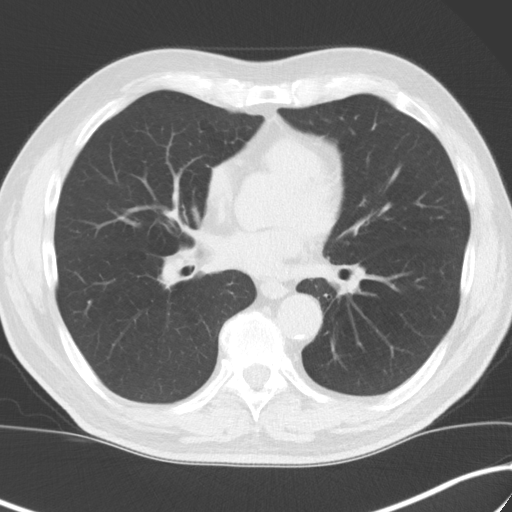
[im 38/68  lung]
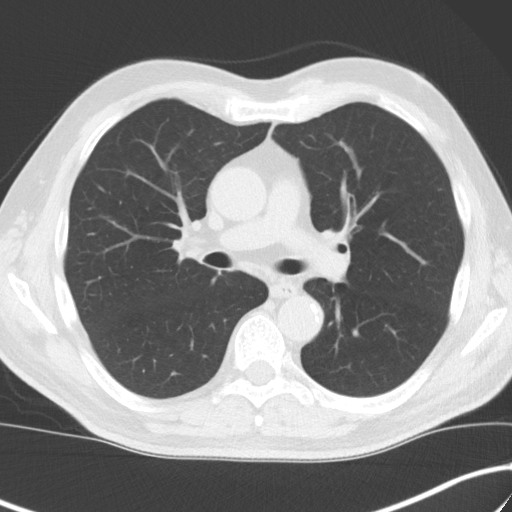
[im 43/68  lung]
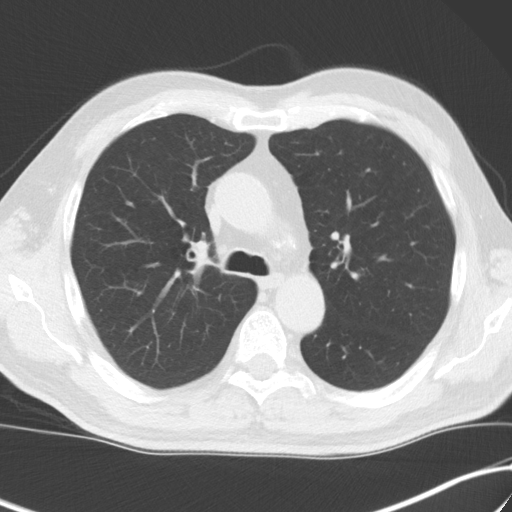
[im 48/68  mediastinal]
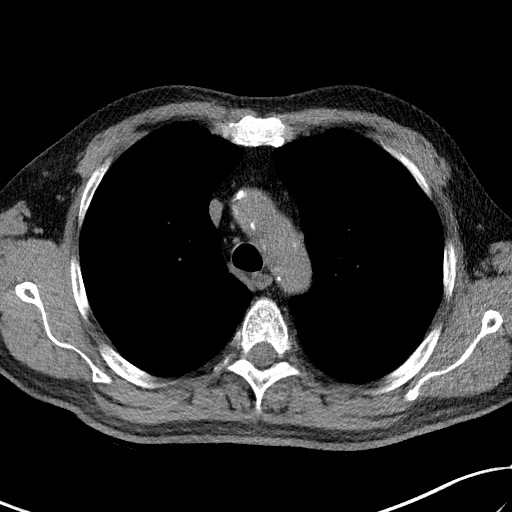
[im 48/68  lung]
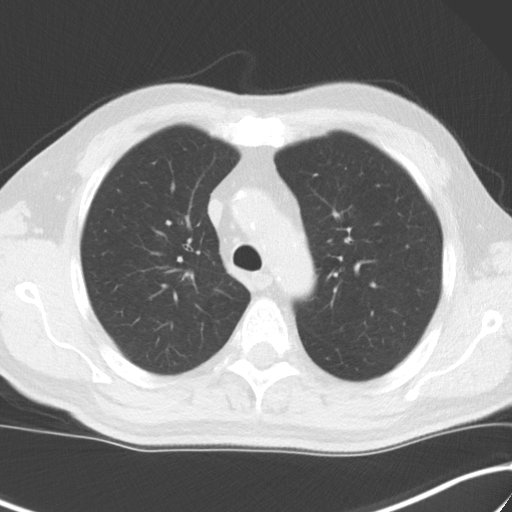
[im 53/68  lung]
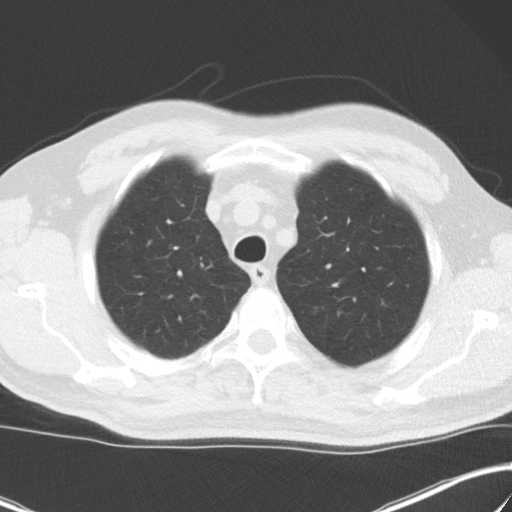
[im 58/68  lung]
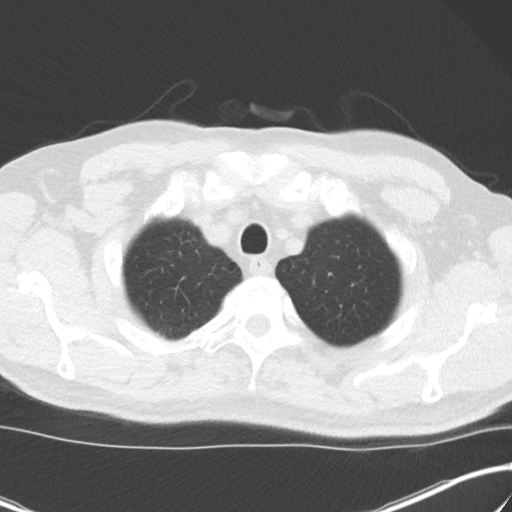
[im 63/68  lung]
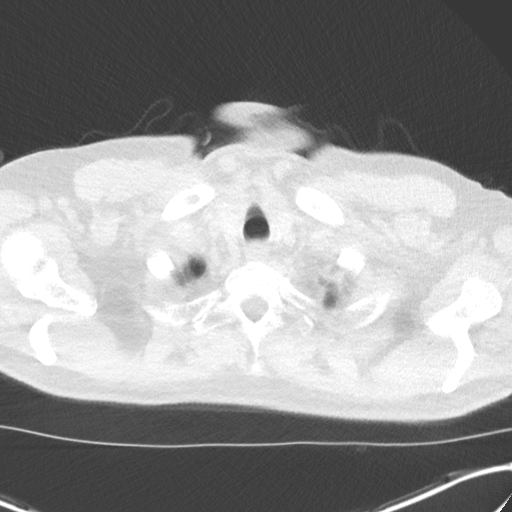

[Series 5: coronal · coronal · 0.72mm/px · 3 of 300 slices shown]
[im 60/300  lung]
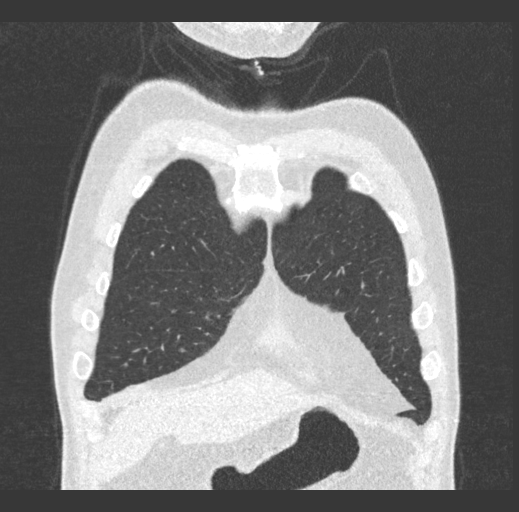
[im 120/300  lung]
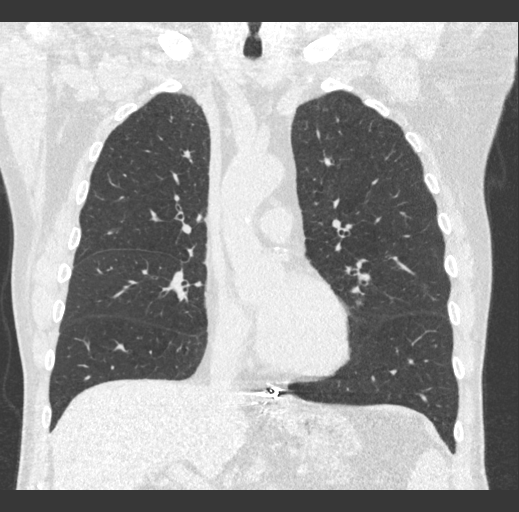
[im 180/300  lung]
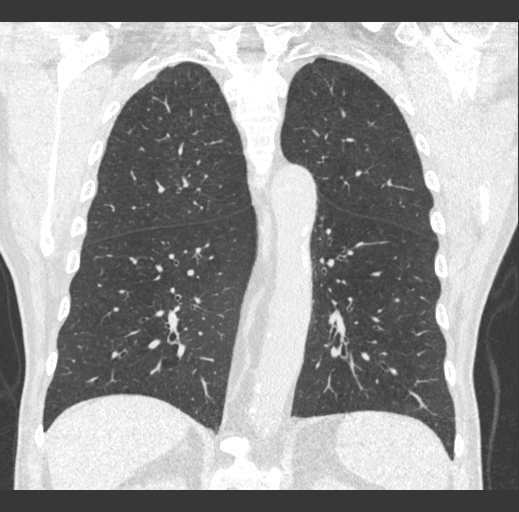

[15 of 36 positions shown; findings below may reference images not displayed]

FINDINGS: Cardiovascular: Heart size is normal. There is no significant
pericardial fluid, thickening or pericardial calcification. There is
aortic atherosclerosis, as well as atherosclerosis of the great
vessels of the mediastinum and the coronary arteries, including
calcified atherosclerotic plaque in the left main, left anterior
descending and left circumflex coronary arteries.

Mediastinum/Nodes: No pathologically enlarged mediastinal or hilar
lymph nodes. Please note that accurate exclusion of hilar adenopathy
is limited on noncontrast CT scans. Esophagus is unremarkable in
appearance. No axillary lymphadenopathy. Surgical clips adjacent to
the gastroesophageal junction.

Lungs/Pleura: Tiny pulmonary nodules are noted in the lungs, largest
of which is in the left lower lobe (axial image 278 of series 3),
with a volume derived mean diameter of 2.4 mm. No other larger more
suspicious appearing pulmonary nodules or masses are noted. No acute
consolidative airspace disease. No pleural effusions. Mild diffuse
bronchial wall thickening with mild centrilobular and paraseptal
emphysema.

Upper Abdomen: Numerous surgical clips in the upper abdomen. Aortic
atherosclerosis.

Musculoskeletal: Chronic appearing compression fracture of L1 with
30% loss of anterior vertebral body height, similar to the prior
study. There are no aggressive appearing lytic or blastic lesions
noted in the visualized portions of the skeleton.
IMPRESSION: 1. Lung-RADS 2S, benign appearance or behavior. Continue annual
screening with low-dose chest CT without contrast in 12 months.
2. The "S" modifier above refers to potentially clinically
significant non lung cancer related findings. Specifically, there is
aortic atherosclerosis, in addition to left main and 2 vessel
coronary artery disease. Please note that although the presence of
coronary artery calcium documents the presence of coronary artery
disease, the severity of this disease and any potential stenosis
cannot be assessed on this non-gated CT examination. Assessment for
potential risk factor modification, dietary therapy or pharmacologic
therapy may be warranted, if clinically indicated.
3. Mild diffuse bronchial wall thickening with mild centrilobular
and paraseptal emphysema; imaging findings suggestive of underlying
COPD.

Aortic Atherosclerosis ([JH]-[JH]) and Emphysema ([JH]-[JH]).

## 2020-05-17 ENCOUNTER — Encounter (HOSPITAL_COMMUNITY): Payer: Self-pay

## 2020-05-17 NOTE — Progress Notes (Signed)
Patient notified of LDCT Lung Cancer Screening Results via mail with the recommendation to follow-up in 12 months. Patient's referring provider has been sent a copy of results. Results are as follows:  IMPRESSION: 1. Lung-RADS 2S, benign appearance or behavior. Continue annual screening with low-dose chest CT without contrast in 12 months. 2. The "S" modifier above refers to potentially clinically significant non lung cancer related findings. Specifically, there is aortic atherosclerosis, in addition to left main and 2 vessel coronary artery disease. Please note that although the presence of coronary artery calcium documents the presence of coronary artery disease, the severity of this disease and any potential stenosis cannot be assessed on this non-gated CT examination. Assessment for potential risk factor modification, dietary therapy or pharmacologic therapy may be warranted, if clinically indicated. 3. Mild diffuse bronchial wall thickening with mild centrilobular and paraseptal emphysema; imaging findings suggestive of underlying COPD.

## 2020-05-18 DIAGNOSIS — Z1331 Encounter for screening for depression: Secondary | ICD-10-CM | POA: Diagnosis not present

## 2020-05-18 DIAGNOSIS — Z6827 Body mass index (BMI) 27.0-27.9, adult: Secondary | ICD-10-CM | POA: Diagnosis not present

## 2020-05-18 DIAGNOSIS — I719 Aortic aneurysm of unspecified site, without rupture: Secondary | ICD-10-CM | POA: Diagnosis not present

## 2020-05-18 DIAGNOSIS — I6389 Other cerebral infarction: Secondary | ICD-10-CM | POA: Diagnosis not present

## 2020-05-24 ENCOUNTER — Telehealth: Payer: Self-pay | Admitting: *Deleted

## 2020-05-24 ENCOUNTER — Ambulatory Visit: Payer: Medicare HMO

## 2020-05-24 DIAGNOSIS — I639 Cerebral infarction, unspecified: Secondary | ICD-10-CM

## 2020-05-24 NOTE — Telephone Encounter (Signed)
Spoke with pt and informed about the monitor. Pt voiced understanding.

## 2020-05-24 NOTE — Telephone Encounter (Signed)
Called to notify pt that order for 14 day Zio monitor has been placed. No answer. Left msg to call back.

## 2020-05-29 ENCOUNTER — Encounter: Payer: Self-pay | Admitting: Vascular Surgery

## 2020-05-29 ENCOUNTER — Other Ambulatory Visit: Payer: Self-pay

## 2020-05-29 ENCOUNTER — Ambulatory Visit: Payer: Medicare HMO | Admitting: Vascular Surgery

## 2020-05-29 VITALS — BP 170/103 | HR 91 | Temp 99.2°F | Resp 18 | Ht 68.0 in | Wt 185.0 lb

## 2020-05-29 DIAGNOSIS — I714 Abdominal aortic aneurysm, without rupture, unspecified: Secondary | ICD-10-CM

## 2020-05-29 NOTE — Progress Notes (Signed)
[facility] [place]  Patient name: Frank Lin MRN: [id number] DOB: 1950-11-22 Sex: male  REASON FOR CONSULT: Discuss asymptomatic abdominal aortic aneurysm  HPI: NATHANAL Lin is a 70 y.o. male, who is here today for discussion of recent ultrasound showing abdominal aortic aneurysm.  He was being evaluated in the hospital for acute stroke.  He reports that 2 weeks ago he had an episode where he actually fell due to weakness in his left leg and also had left arm weakness.  He was admitted with a right brain stroke.  Ultrasound of his carotids and MRA ruled revealed no evidence of extracranial cerebral occlusive disease.  He did have an ultrasound at that time showing some enlargement in his aorta compared to study several years ago.  Past medical history is significant for remote history of ulcer surgery and 1971 as a young man.  He has had no further abdominal surgeries.  Did have coronary intervention for coronary stenosis in 1995.  He continues to be a cigarette smoker.  Past Medical History:  Diagnosis Date  . Anemia 11/28/2011  . B12 deficiency 11/29/2011  . CAD in native artery, PCI 1995   . Colon polyps 11/30/2011  . GERD (gastroesophageal reflux disease)   . Leukopenia 11/30/2011  . Pancytopenia ([place]) 11/28/2011  . Peptic ulcer 1971  . Unintentional weight loss 11/28/2011    Family History  Problem Relation Age of Onset  . Heart failure Brother     SOCIAL HISTORY: Social History   Socioeconomic History  . Marital status: Widowed    Spouse name: Not on file  . Number of children: 2  . Years of education: Not on file  . Highest education level: Not on file  Occupational History  . Occupation: Psychiatric nurse: [employer]  Tobacco Use  . Smoking status: Former Smoker    Packs/day: 0.50    Years: 45.00    Pack years: 22.50    Types: Cigarettes    Quit date: 11/27/1996     Years since quitting: 23.5  . Smokeless tobacco: Never Used  Substance and Sexual Activity  . Alcohol use: No  . Drug use: No  . Sexual activity: Yes  Other Topics Concern  . Not on file  Social History Narrative  . Not on file   Social Determinants of Health   Financial Resource Strain: Not on file  Food Insecurity: Not on file  Transportation Needs: Not on file  Physical Activity: Not on file  Stress: Not on file  Social Connections: Not on file  Intimate Partner Violence: Not on file    No Known Allergies  Current Outpatient Medications  Medication Sig Dispense Refill  . aspirin EC 81 MG tablet Take 1 tablet (81 mg total) by mouth daily. Swallow whole. 150 tablet 2  . atorvastatin (LIPITOR) 40 MG tablet Take 1 tablet (40 mg total) by mouth daily. 30 tablet 0  . cyanocobalamin (,VITAMIN B-12,) 1000 MCG/ML injection INJECT 1 ML INTRAMUSCULARLY ONCE MONTHLY. 1 mL 13  . nicotine (NICODERM CQ - DOSED IN MG/24 HOURS) 14 mg/24hr patch Place 1 patch (14 mg total) onto the skin daily. (Patient not taking: Reported on 05/29/2020) 28 patch 0   No current facility-administered medications for this visit.    REVIEW OF SYSTEMS:  [X]  denotes positive finding, [ ]  denotes negative finding Cardiac  Comments:  Chest pain or chest pressure:    Shortness of breath upon  exertion:    Short of breath when lying flat:    Irregular heart rhythm:        Vascular    Pain in calf, thigh, or hip brought on by ambulation:    Pain in feet at night that wakes you up from your sleep:     Blood clot in your veins: x   Leg swelling:         Pulmonary    Oxygen at home:    Productive cough:     Wheezing:         Neurologic    Sudden weakness in arms or legs:  x   Sudden numbness in arms or legs:  x   Sudden onset of difficulty speaking or slurred speech:    Temporary loss of vision in one eye:     Problems with dizziness:         Gastrointestinal    Blood in stool:     Vomited blood:          Genitourinary    Burning when urinating:     Blood in urine:        Psychiatric    Major depression:         Hematologic    Bleeding problems:    Problems with blood clotting too easily:        Skin    Rashes or ulcers:        Constitutional    Fever or chills:      PHYSICAL EXAM: Vitals:   05/29/20 1002  BP: (!) 170/103  Pulse: 91  Resp: 18  Temp: 99.2 F (37.3 C)  TempSrc: Other (Comment)  SpO2: 98%  Weight: 185 lb (83.9 kg)  Height: 5\' 8"  (1.727 m)    GENERAL: The patient is a well-nourished male, in no acute distress. The vital signs are documented above. CARDIOVASCULAR: Carotid arteries without bruits bilaterally.  2+ radial pulses bilaterally.  2+ femoral pulses bilaterally.  No evidence of popliteal artery aneurysm by physical exam.  He does have a prominent aortic pulsation which is nontender in his abdomen PULMONARY: There is good air exchange  MUSCULOSKELETAL: There are no major deformities or cyanosis. NEUROLOGIC: No focal weakness or paresthesias are detected. SKIN: There are no ulcers or rashes noted. PSYCHIATRIC: The patient has a normal affect.  DATA:  Recent ultrasound of his aorta on 05/15/2020 revealed a maximal diameter of 3.5 cm.  Maximal diameter in 2016 was 2.6 cm.  MEDICAL ISSUES: Had long discussion with the patient regarding his small aneurysm.  Explained that he has had some growth over the last 6 years as expected.  I explained open and stent graft repair of abdominal aortic aneurysm.  Explained that he is minimal if any risk for rupture it is very small aneurysm size.  I have recommended repeat duplex in 2 years to rule out any significant progression.   Rosetta Posner, MD FACS [facility] [phone] Pager [phone]  Note: Portions of this report may have been transcribed using voice recognition software.  Every effort has been made to ensure accuracy; however, inadvertent  computerized transcription errors may still be present.

## 2020-06-20 ENCOUNTER — Ambulatory Visit: Payer: Medicare HMO | Admitting: Diagnostic Neuroimaging

## 2021-03-23 DIAGNOSIS — Z0001 Encounter for general adult medical examination with abnormal findings: Secondary | ICD-10-CM | POA: Diagnosis not present

## 2021-03-23 DIAGNOSIS — F17200 Nicotine dependence, unspecified, uncomplicated: Secondary | ICD-10-CM | POA: Diagnosis not present

## 2021-03-23 DIAGNOSIS — Z6826 Body mass index (BMI) 26.0-26.9, adult: Secondary | ICD-10-CM | POA: Diagnosis not present

## 2021-03-23 DIAGNOSIS — Z1331 Encounter for screening for depression: Secondary | ICD-10-CM | POA: Diagnosis not present

## 2021-03-23 DIAGNOSIS — E663 Overweight: Secondary | ICD-10-CM | POA: Diagnosis not present

## 2021-03-23 DIAGNOSIS — I719 Aortic aneurysm of unspecified site, without rupture: Secondary | ICD-10-CM | POA: Diagnosis not present

## 2021-03-23 DIAGNOSIS — I6930 Unspecified sequelae of cerebral infarction: Secondary | ICD-10-CM | POA: Diagnosis not present

## 2021-04-25 ENCOUNTER — Encounter (HOSPITAL_COMMUNITY): Payer: Self-pay

## 2021-04-25 NOTE — Progress Notes (Signed)
Call placed to patient regarding follow-up LDCT. Unable to reach patient at this time. Detailed VM left asking that the patient return my call.

## 2021-08-06 DIAGNOSIS — H5210 Myopia, unspecified eye: Secondary | ICD-10-CM | POA: Diagnosis not present

## 2021-08-06 DIAGNOSIS — Z0100 Encounter for examination of eyes and vision without abnormal findings: Secondary | ICD-10-CM | POA: Diagnosis not present

## 2021-11-14 DIAGNOSIS — M9903 Segmental and somatic dysfunction of lumbar region: Secondary | ICD-10-CM | POA: Diagnosis not present

## 2021-11-14 DIAGNOSIS — M5441 Lumbago with sciatica, right side: Secondary | ICD-10-CM | POA: Diagnosis not present

## 2021-11-14 DIAGNOSIS — M9902 Segmental and somatic dysfunction of thoracic region: Secondary | ICD-10-CM | POA: Diagnosis not present

## 2021-11-14 DIAGNOSIS — M9905 Segmental and somatic dysfunction of pelvic region: Secondary | ICD-10-CM | POA: Diagnosis not present

## 2021-11-26 DIAGNOSIS — M5441 Lumbago with sciatica, right side: Secondary | ICD-10-CM | POA: Diagnosis not present

## 2021-11-26 DIAGNOSIS — M9903 Segmental and somatic dysfunction of lumbar region: Secondary | ICD-10-CM | POA: Diagnosis not present

## 2021-11-26 DIAGNOSIS — M9902 Segmental and somatic dysfunction of thoracic region: Secondary | ICD-10-CM | POA: Diagnosis not present

## 2021-11-26 DIAGNOSIS — M9905 Segmental and somatic dysfunction of pelvic region: Secondary | ICD-10-CM | POA: Diagnosis not present

## 2021-12-19 NOTE — Progress Notes (Signed)
Attempted to call patient to schedule LDCT. Unable to reach patient directly. Detailed VM left asking that the patient return my call.

## 2022-03-21 NOTE — Progress Notes (Signed)
Attempted to reach patient regarding follow-up LDCT. Unable to reach the patient directly, detailed VM left asking that the patient return my call.  

## 2022-04-23 DIAGNOSIS — H11153 Pinguecula, bilateral: Secondary | ICD-10-CM | POA: Diagnosis not present

## 2022-04-23 DIAGNOSIS — H35031 Hypertensive retinopathy, right eye: Secondary | ICD-10-CM | POA: Diagnosis not present

## 2022-04-23 DIAGNOSIS — H02831 Dermatochalasis of right upper eyelid: Secondary | ICD-10-CM | POA: Diagnosis not present

## 2022-04-23 DIAGNOSIS — H2513 Age-related nuclear cataract, bilateral: Secondary | ICD-10-CM | POA: Diagnosis not present

## 2022-05-01 NOTE — Progress Notes (Signed)
Attempted to reach patient regarding follow-up LDCT. Unable to reach the patient directly, detailed VM left asking that the patient return my call.

## 2022-05-23 DIAGNOSIS — D23122 Other benign neoplasm of skin of left lower eyelid, including canthus: Secondary | ICD-10-CM | POA: Diagnosis not present

## 2022-05-23 DIAGNOSIS — H0279 Other degenerative disorders of eyelid and periocular area: Secondary | ICD-10-CM | POA: Diagnosis not present

## 2022-05-23 DIAGNOSIS — D485 Neoplasm of uncertain behavior of skin: Secondary | ICD-10-CM | POA: Diagnosis not present

## 2022-05-24 ENCOUNTER — Other Ambulatory Visit (HOSPITAL_COMMUNITY): Payer: Self-pay | Admitting: Family Medicine

## 2022-05-24 DIAGNOSIS — E782 Mixed hyperlipidemia: Secondary | ICD-10-CM | POA: Diagnosis not present

## 2022-05-24 DIAGNOSIS — Z125 Encounter for screening for malignant neoplasm of prostate: Secondary | ICD-10-CM | POA: Diagnosis not present

## 2022-05-24 DIAGNOSIS — E663 Overweight: Secondary | ICD-10-CM | POA: Diagnosis not present

## 2022-05-24 DIAGNOSIS — Z6827 Body mass index (BMI) 27.0-27.9, adult: Secondary | ICD-10-CM | POA: Diagnosis not present

## 2022-05-24 DIAGNOSIS — F17200 Nicotine dependence, unspecified, uncomplicated: Secondary | ICD-10-CM | POA: Diagnosis not present

## 2022-05-24 DIAGNOSIS — R600 Localized edema: Secondary | ICD-10-CM | POA: Diagnosis not present

## 2022-05-24 DIAGNOSIS — Z1331 Encounter for screening for depression: Secondary | ICD-10-CM | POA: Diagnosis not present

## 2022-05-24 DIAGNOSIS — Z0001 Encounter for general adult medical examination with abnormal findings: Secondary | ICD-10-CM | POA: Diagnosis not present

## 2022-05-24 DIAGNOSIS — E559 Vitamin D deficiency, unspecified: Secondary | ICD-10-CM | POA: Diagnosis not present

## 2022-05-24 DIAGNOSIS — I719 Aortic aneurysm of unspecified site, without rupture: Secondary | ICD-10-CM | POA: Diagnosis not present

## 2022-05-24 DIAGNOSIS — I6930 Unspecified sequelae of cerebral infarction: Secondary | ICD-10-CM | POA: Diagnosis not present

## 2022-05-24 DIAGNOSIS — R29898 Other symptoms and signs involving the musculoskeletal system: Secondary | ICD-10-CM | POA: Diagnosis not present

## 2022-05-29 ENCOUNTER — Other Ambulatory Visit: Payer: Self-pay

## 2022-05-29 DIAGNOSIS — I714 Abdominal aortic aneurysm, without rupture, unspecified: Secondary | ICD-10-CM

## 2022-05-29 DIAGNOSIS — R29898 Other symptoms and signs involving the musculoskeletal system: Secondary | ICD-10-CM

## 2022-06-04 DIAGNOSIS — I10 Essential (primary) hypertension: Secondary | ICD-10-CM | POA: Diagnosis not present

## 2022-06-19 ENCOUNTER — Other Ambulatory Visit: Payer: Self-pay

## 2022-06-19 DIAGNOSIS — D485 Neoplasm of uncertain behavior of skin: Secondary | ICD-10-CM | POA: Diagnosis not present

## 2022-06-19 DIAGNOSIS — L820 Inflamed seborrheic keratosis: Secondary | ICD-10-CM | POA: Diagnosis not present

## 2022-06-19 DIAGNOSIS — H0279 Other degenerative disorders of eyelid and periocular area: Secondary | ICD-10-CM | POA: Diagnosis not present

## 2022-06-19 DIAGNOSIS — D23122 Other benign neoplasm of skin of left lower eyelid, including canthus: Secondary | ICD-10-CM | POA: Diagnosis not present

## 2022-06-19 HISTORY — PX: EYELID CARCINOMA EXCISION: SHX1563

## 2022-06-26 ENCOUNTER — Ambulatory Visit (INDEPENDENT_AMBULATORY_CARE_PROVIDER_SITE_OTHER): Payer: Medicare HMO

## 2022-06-26 ENCOUNTER — Ambulatory Visit: Payer: Medicare HMO | Admitting: Vascular Surgery

## 2022-06-26 ENCOUNTER — Encounter: Payer: Self-pay | Admitting: Vascular Surgery

## 2022-06-26 VITALS — BP 171/95 | HR 71 | Temp 97.7°F | Ht 68.0 in | Wt 180.6 lb

## 2022-06-26 DIAGNOSIS — I714 Abdominal aortic aneurysm, without rupture, unspecified: Secondary | ICD-10-CM | POA: Diagnosis not present

## 2022-06-26 DIAGNOSIS — R29898 Other symptoms and signs involving the musculoskeletal system: Secondary | ICD-10-CM | POA: Diagnosis not present

## 2022-06-26 LAB — VAS US ABI WITH/WO TBI
Left ABI: 0.94
Right ABI: 0.88

## 2022-06-26 NOTE — Progress Notes (Signed)
[facility] [place]  Patient name: Frank Lin MRN: [id number] DOB: 02-Mar-1951 Sex: male  REASON FOR VISIT: Follow-up asymptomatic abdominal aortic aneurysm  HPI: Frank Lin is a 72 y.o. male here today for follow-up.  Has known small abdominal aortic aneurysm and is here today for ultrasound follow-up.  He has no symptoms referable to his aneurysm.  Reports his health is otherwise stable.  Past Medical History:  Diagnosis Date   Anemia 11/28/2011   B12 deficiency 11/29/2011   CAD in native artery, PCI 1995    Colon polyps 11/30/2011   GERD (gastroesophageal reflux disease)    Leukopenia 11/30/2011   Pancytopenia 11/28/2011   Peptic ulcer 1971   Unintentional weight loss 11/28/2011    Family History  Problem Relation Age of Onset   Heart failure Brother     SOCIAL HISTORY: Social History   Tobacco Use   Smoking status: Every Day    Packs/day: 1.00    Years: 45.00    Additional pack years: 0.00    Total pack years: 45.00    Types: Cigarettes    Last attempt to quit: 11/27/1996    Years since quitting: 25.5   Smokeless tobacco: Never  Substance Use Topics   Alcohol use: No    No Known Allergies  Current Outpatient Medications  Medication Sig Dispense Refill   aspirin EC 81 MG tablet Take 81 mg by mouth daily. Swallow whole.     atorvastatin (LIPITOR) 10 MG tablet Take 10 mg by mouth daily.     cyanocobalamin (,VITAMIN B-12,) 1000 MCG/ML injection INJECT 1 ML INTRAMUSCULARLY ONCE MONTHLY. 1 mL 13   No current facility-administered medications for this visit.    REVIEW OF SYSTEMS:  [X]  denotes positive finding, [ ]  denotes negative finding Cardiac  Comments:  Chest pain or chest pressure:    Shortness of breath upon exertion:    Short of breath when lying flat:    Irregular heart rhythm:        Vascular    Pain in calf, thigh, or hip brought on by ambulation:    Pain in feet at night that wakes  you up from your sleep:     Blood clot in your veins:    Leg swelling:           PHYSICAL EXAM: Vitals:   06/26/22 0850  BP: (!) 171/95  Pulse: 71  Temp: 97.7 F (36.5 C)  SpO2: 98%  Weight: 180 lb 9.6 oz (81.9 kg)  Height: 5\' 8"  (1.727 m)    GENERAL: The patient is a well-nourished male, in no acute distress. The vital signs are documented above. CARDIOVASCULAR: Plus radial 2+ femoral and 2+ popliteal pulses bilaterally.  I do not feel abdominal aortic aneurysm.  He has no abdominal tenderness. PULMONARY: There is good air exchange  MUSCULOSKELETAL: There are no major deformities or cyanosis. NEUROLOGIC: No focal weakness or paresthesias are detected. SKIN: There are no ulcers or rashes noted. PSYCHIATRIC: The patient has a normal affect.  DATA:  Ultrasound today reveals maximal diameter of his aorta at 3.5 cm.  Been no change since February 2022 ultrasound.  In 2016 his maximal diameter was 2.6 cm.  MEDICAL ISSUES: I again discussed the difference of his small abdominal arctic aneurysm.  I explained that he is a minimal risk from his current size of aneurysm.  I did recommend that we see him again in 2 years for continued ultrasound surveillance.  Rosetta Posner, MD FACS [facility] [phone]  Note: Portions of this report may have been transcribed using voice recognition software.  Every effort has been made to ensure accuracy; however, inadvertent computerized transcription errors may still be present.

## 2022-07-01 ENCOUNTER — Ambulatory Visit (HOSPITAL_COMMUNITY)
Admission: RE | Admit: 2022-07-01 | Discharge: 2022-07-01 | Disposition: A | Discharge status: Home / Self Care | Payer: Medicare HMO | Source: Ambulatory Visit | Attending: Family Medicine | Admitting: Family Medicine

## 2022-07-01 DIAGNOSIS — Z122 Encounter for screening for malignant neoplasm of respiratory organs: Secondary | ICD-10-CM | POA: Insufficient documentation

## 2022-07-01 DIAGNOSIS — J432 Centrilobular emphysema: Secondary | ICD-10-CM | POA: Insufficient documentation

## 2022-07-01 DIAGNOSIS — F17200 Nicotine dependence, unspecified, uncomplicated: Secondary | ICD-10-CM

## 2022-07-01 DIAGNOSIS — F17210 Nicotine dependence, cigarettes, uncomplicated: Secondary | ICD-10-CM | POA: Diagnosis not present

## 2022-07-01 DIAGNOSIS — I700 Atherosclerosis of aorta: Secondary | ICD-10-CM | POA: Diagnosis not present

## 2022-07-01 DIAGNOSIS — I2510 Atherosclerotic heart disease of native coronary artery without angina pectoris: Secondary | ICD-10-CM | POA: Diagnosis not present

## 2022-08-01 DIAGNOSIS — I10 Essential (primary) hypertension: Secondary | ICD-10-CM | POA: Diagnosis not present

## 2022-11-21 DIAGNOSIS — H2513 Age-related nuclear cataract, bilateral: Secondary | ICD-10-CM | POA: Diagnosis not present

## 2022-11-21 DIAGNOSIS — H11153 Pinguecula, bilateral: Secondary | ICD-10-CM | POA: Diagnosis not present

## 2022-11-21 DIAGNOSIS — H2512 Age-related nuclear cataract, left eye: Secondary | ICD-10-CM | POA: Diagnosis not present

## 2022-11-21 DIAGNOSIS — H35361 Drusen (degenerative) of macula, right eye: Secondary | ICD-10-CM | POA: Diagnosis not present

## 2022-11-21 DIAGNOSIS — H35033 Hypertensive retinopathy, bilateral: Secondary | ICD-10-CM | POA: Diagnosis not present

## 2023-02-18 DIAGNOSIS — H268 Other specified cataract: Secondary | ICD-10-CM | POA: Diagnosis not present

## 2023-02-18 DIAGNOSIS — H2513 Age-related nuclear cataract, bilateral: Secondary | ICD-10-CM | POA: Diagnosis not present

## 2023-02-18 DIAGNOSIS — H2512 Age-related nuclear cataract, left eye: Secondary | ICD-10-CM | POA: Diagnosis not present

## 2023-02-24 DIAGNOSIS — Z1212 Encounter for screening for malignant neoplasm of rectum: Secondary | ICD-10-CM | POA: Diagnosis not present

## 2023-02-24 DIAGNOSIS — Z1211 Encounter for screening for malignant neoplasm of colon: Secondary | ICD-10-CM | POA: Diagnosis not present

## 2023-03-09 LAB — COLOGUARD: COLOGUARD: POSITIVE — AB

## 2023-03-17 DIAGNOSIS — H2511 Age-related nuclear cataract, right eye: Secondary | ICD-10-CM | POA: Diagnosis not present

## 2023-03-25 DIAGNOSIS — H268 Other specified cataract: Secondary | ICD-10-CM | POA: Diagnosis not present

## 2023-03-25 DIAGNOSIS — H2511 Age-related nuclear cataract, right eye: Secondary | ICD-10-CM | POA: Diagnosis not present

## 2023-03-28 NOTE — Progress Notes (Signed)
 Referring Provider: Leonce Lucie JINNY DEVONNA Primary Care Physician:  Leonce Lucie JINNY DEVONNA Primary Gastroenterologist:  Dr. Shaaron  Chief Complaint  Patient presents with   Colonoscopy    Positive cologuard     HPI:   Frank Lin is a 73 y.o. male presenting today at the request of Leonce Lucie JINNY, PA-C for positive Cologuard.  Colonoscopy 12/30/2011 with multiple colon polyps s/p multiple snare polypectomies and polyp debulking.  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   He was referred to general surgery for partial colectomy.  He underwent laparoscopic hand-assisted partial colectomy with removal of terminal ileum 01/15/2012.  Ligation was performed at [facility] and proximal sigmoid colon with side-to-side ileocolic anastomosis thereafter.  Pathology with multiple fragments of tubulovillous adenomas and tubular adenomas with previous biopsy site changes and surface ulceration.  15 lymph nodes negative for neoplasm.  Margins negative for atypia or malignancy.  Dr. Shaaron I recommend repeating a colonoscopy in 1 year after surgery.  Today:  Cologuard was completed either December or early January. No overt GI bleeding. Has intermittent diarrhea. Usually just 1 BM per day. Stool is loose. Depends on what he eats. Worse with fried/greasy foods. No unintentional weight loss, abdominal pain, nausea, vomiting, GERD symptoms, dysphagia.    Past Medical History:  Diagnosis Date   Anemia 11/28/2011   B12 deficiency 11/29/2011   CAD in native artery, PCI 1995    Colon polyps 11/30/2011   GERD (gastroesophageal reflux disease)    Leukopenia 11/30/2011   Pancytopenia (HCC) 11/28/2011   Peptic ulcer 1971   Unintentional weight loss 11/28/2011    Past Surgical History:  Procedure Laterality Date   APPENDECTOMY  03/18/1968   CARDIAC CATHETERIZATION  03/18/1994   s/p PTCA/stent  (Dr Morris)   COLON RESECTION  01/15/2012   Procedure: HAND ASSISTED LAPAROSCOPIC COLON RESECTION;  Surgeon: Oneil DELENA Budge, MD;  Location: [facility];  Service: General;  Laterality: N/A;  partial colectomy   COLONOSCOPY  12/30/2011   RMR: Multiple colonic polyps-status post multiple snare polypectomies and polyp debulking. 1 small rectal polyp removed as described above . Sigmoid and rectum looked good endoscopically.    ESOPHAGOGASTRODUODENOSCOPY  11/29/2014   RMR: Normal esophagus. Status post prior gastric surgery as described above. Abnoraml proximal small bowel status post biopsy. Status psot gastric biopsy.    EYELID CARCINOMA EXCISION Left 06/19/2022   Benign   PARTIAL COLECTOMY  01/15/2012   Procedure: PARTIAL COLECTOMY;  Surgeon: Oneil DELENA Budge, MD;  Location: [facility];  Service: General;  Laterality: N/A;   SEPTOPLASTY     STOMACH SURGERY  03/18/1969   Hx PUD     Current Outpatient Medications  Medication Sig Dispense Refill   aspirin  EC 81 MG tablet Take 81 mg by mouth daily. Swallow whole.     atorvastatin  (LIPITOR) 10 MG tablet Take 10 mg by mouth daily.     cyanocobalamin  (,VITAMIN B-12,) 1000 MCG/ML injection INJECT 1 ML INTRAMUSCULARLY ONCE MONTHLY. 1 mL 13   No current facility-administered medications for this visit.    Allergies as of 03/31/2023   (No Known Allergies)    Family History  Problem Relation Age of Onset   Heart failure Brother    Colon cancer Neg Hx     Social History   Socioeconomic History   Marital status: Widowed    Spouse name: Not on file   Number of children: 2  Years of education: Not on file   Highest education level: Not on file  Occupational History   Occupation: Chartered Loss Adjuster Delivery tech    Employer: [employer]  Tobacco Use   Smoking status: Every Day    Current packs/day: 0.00    Average packs/day: 1 pack/day for 45.0 years (45.0 ttl pk-yrs)    Types: Cigarettes    Start date: 11/28/1951    Last attempt to  quit: 11/27/1996    Years since quitting: 26.3   Smokeless tobacco: Never  Vaping Use   Vaping status: Never Used  Substance and Sexual Activity   Alcohol use: No   Drug use: No   Sexual activity: Yes  Other Topics Concern   Not on file  Social History Narrative   Not on file   Social Drivers of Health   Financial Resource Strain: Not on file  Food Insecurity: Not on file  Transportation Needs: Not on file  Physical Activity: Not on file  Stress: Not on file  Social Connections: Not on file  Intimate Partner Violence: Not on file    Review of Systems: Gen: Denies any fever, chills, cold or flulike symptoms, presyncope, syncope. CV: Denies chest pain, heart palpitations. Resp: Denies shortness of breath, cough. GI: See HPI GU : Denies urinary burning, urinary frequency, urinary hesitancy MS: Denies joint pain. Derm: Denies rash. Psych: Denies depression, anxiety. Heme: See HPI  Physical Exam: BP 135/88 (BP Location: Right Arm, Patient Position: Sitting, Cuff Size: Large)   Pulse 91   Temp 98.1 F (36.7 C) (Temporal)   Ht 5' 8 (1.727 m)   Wt 183 lb 12.8 oz (83.4 kg)   BMI 27.95 kg/m  General:   Alert and oriented. Pleasant and cooperative. Well-nourished and well-developed.  Head:  Normocephalic and atraumatic. Eyes:  Without icterus, sclera clear and conjunctiva pink.  Ears:  Normal auditory acuity. Lungs:  Clear to auscultation bilaterally. No wheezes, rales, or rhonchi. No distress.  Heart:  S1, S2 present without murmurs appreciated.  Abdomen:  +BS, soft, non-tender and non-distended. No HSM noted. No guarding or rebound. No masses appreciated.  Rectal:  Deferred  Msk:  Symmetrical without gross deformities. Normal posture. Extremities:  Without edema. Neurologic:  Alert and  oriented x4;  grossly normal neurologically. Skin:  Intact without significant lesions or rashes. Psych:  Normal mood and affect.    Assessment:  73 year old male with history of  CAD with prior coronary stenting, B12 deficiency, AAA,  CVA, PUD, multiple adenomatous colon polyps with high-grade dysplasia in 2013 s/p partial colectomy, presenting today at the request of Leonce, Samantha J, PA-C for positive Cologuard. Clinically patient is doing well without any significant GI symptoms.  No overt GI bleeding, unintentional weight loss.  He is overdue for surveillance colonoscopy and now with positive cologuard. We will get a colonoscopy arranged for him.    Plan:  Proceed with colonoscopy with propofol  by Dr. Shaaron in near future. The risks, benefits, and alternatives have been discussed with the patient in detail. The patient states understanding and desires to proceed.  ASA 3 Follow-up prn.    Josette Centers, PA-C [facility] 03/31/2023

## 2023-03-31 ENCOUNTER — Encounter: Payer: Self-pay | Admitting: Gastroenterology

## 2023-03-31 ENCOUNTER — Ambulatory Visit (INDEPENDENT_AMBULATORY_CARE_PROVIDER_SITE_OTHER): Payer: Medicare HMO | Admitting: Gastroenterology

## 2023-03-31 ENCOUNTER — Telehealth: Payer: Self-pay | Admitting: *Deleted

## 2023-03-31 ENCOUNTER — Other Ambulatory Visit: Payer: Self-pay | Admitting: *Deleted

## 2023-03-31 ENCOUNTER — Encounter: Payer: Self-pay | Admitting: *Deleted

## 2023-03-31 VITALS — BP 135/88 | HR 91 | Temp 98.1°F | Ht 68.0 in | Wt 183.8 lb

## 2023-03-31 DIAGNOSIS — Z860101 Personal history of adenomatous and serrated colon polyps: Secondary | ICD-10-CM | POA: Diagnosis not present

## 2023-03-31 DIAGNOSIS — R195 Other fecal abnormalities: Secondary | ICD-10-CM

## 2023-03-31 MED ORDER — PEG 3350-KCL-NA BICARB-NACL 420 G PO SOLR: 4000.0000 mL | Freq: Once | ORAL | 0 refills | Status: AC

## 2023-03-31 NOTE — Telephone Encounter (Signed)
 PA approved via cohere DOS:  05/14/2023 - 08/11/2023 Tracking #[id number]

## 2023-03-31 NOTE — Patient Instructions (Signed)
 We will get you scheduled for colonoscopy in the near future with Dr. Shaaron.  If you are not scheduled today, you should receive a call from our scheduler, Madelin Ponto.  The number that she would likely call from is [phone].  You can try saving this into your phone so that you receive the call from us .  I will see you back in the office as Dr. Shaaron recommends.   It was good to meet you today!   Josette Centers, PA-C [facility]

## 2023-04-01 ENCOUNTER — Encounter: Payer: Self-pay | Admitting: *Deleted

## 2023-05-08 NOTE — Patient Instructions (Addendum)
Frank Lin  05/08/2023     @PREFPERIOPPHARMACY @   Your procedure is scheduled on  05/14/2023.   Report to [facility] at  0900 A.M.   Call this number if you have problems the morning of surgery:  [phone]  If you experience any cold or flu symptoms such as cough, fever, chills, shortness of breath, etc. between now and your scheduled surgery, please notify us at the above number.   Remember:  Follow the diet and prep instructions given to you by the office.    You may drink clear liquids until 0700 am on 05/14/2023.    Clear liquids allowed are:                    Water, Juice (No red color; non-citric and without pulp; diabetics please choose diet or no sugar options), Carbonated beverages (diabetics please choose diet or no sugar options), Clear Tea (No creamer, milk, or cream, including half & half and powdered creamer), Black Coffee Only (No creamer, milk or cream, including half & half and powdered creamer), and Clear Sports drink (No red color; diabetics please choose diet or no sugar options)    Take these medicines the morning of surgery with A SIP OF WATER                                                None.    Do not wear jewelry, make-up or nail polish, including gel polish,  artificial nails, or any other type of covering on natural nails (fingers and  toes).  Do not wear lotions, powders, or perfumes, or deodorant.  Do not shave 48 hours prior to surgery.  Men may shave face and neck.  Do not bring valuables to the hospital.  [facility] is not responsible for any belongings or valuables.  Contacts, dentures or bridgework may not be worn into surgery.  Leave your suitcase in the car.  After surgery it may be brought to your room.  For patients admitted to the hospital, discharge time will be determined by your treatment team.  Patients discharged the day of surgery will not be allowed to drive home and must have someone with them for 24 hours.     Special instructions:   DO NOT smoke tobacco or vape for 24 hours before your procedure.  Please read over the following fact sheets that you were given. Anesthesia Post-op Instructions and Care and Recovery After Surgery       Colonoscopy, Adult, Care After The following information offers guidance on how to care for yourself after your procedure. Your health care provider may also give you more specific instructions. If you have problems or questions, contact your health care provider. What can I expect after the procedure? After the procedure, it is common to have: A small amount of blood in your stool for 24 hours after the procedure. Some gas. Mild cramping or bloating of your abdomen. Follow these instructions at home: Eating and drinking  Drink enough fluid to keep your urine pale yellow. Follow instructions from your health care provider about eating or drinking restrictions. Resume your normal diet as told by your health care provider. Avoid heavy or fried foods that are hard to digest. Activity Rest as told by your health care provider. Avoid sitting for  a long time without moving. Get up to take short walks every 1-2 hours. This is important to improve blood flow and breathing. Ask for help if you feel weak or unsteady. Return to your normal activities as told by your health care provider. Ask your health care provider what activities are safe for you. Managing cramping and bloating  Try walking around when you have cramps or feel bloated. If directed, apply heat to your abdomen as told by your health care provider. Use the heat source that your health care provider recommends, such as a moist heat pack or a heating pad. Place a towel between your skin and the heat source. Leave the heat on for 20-30 minutes. Remove the heat if your skin turns bright red. This is especially important if you are unable to feel pain, heat, or cold. You have a greater risk of getting  burned. General instructions If you were given a sedative during the procedure, it can affect you for several hours. Do not drive or operate machinery until your health care provider says that it is safe. For the first 24 hours after the procedure: Do not sign important documents. Do not drink alcohol. Do your regular daily activities at a slower pace than normal. Eat soft foods that are easy to digest. Take over-the-counter and prescription medicines only as told by your health care provider. Keep all follow-up visits. This is important. Contact a health care provider if: You have blood in your stool 2-3 days after the procedure. Get help right away if: You have more than a small spotting of blood in your stool. You have large blood clots in your stool. You have swelling of your abdomen. You have nausea or vomiting. You have a fever. You have increasing pain in your abdomen that is not relieved with medicine. These symptoms may be an emergency. Get help right away. Call 911. Do not wait to see if the symptoms will go away. Do not drive yourself to the hospital. Summary After the procedure, it is common to have a small amount of blood in your stool. You may also have mild cramping and bloating of your abdomen. If you were given a sedative during the procedure, it can affect you for several hours. Do not drive or operate machinery until your health care provider says that it is safe. Get help right away if you have a lot of blood in your stool, nausea or vomiting, a fever, or increased pain in your abdomen. This information is not intended to replace advice given to you by your health care provider. Make sure you discuss any questions you have with your health care provider. Document Revised: 04/16/2022 Document Reviewed: 10/25/2020 Elsevier Patient Education  2024 Elsevier Inc.Monitored Anesthesia Care, Care After The following information offers guidance on how to care for yourself  after your procedure. Your health care provider may also give you more specific instructions. If you have problems or questions, contact your health care provider. What can I expect after the procedure? After the procedure, it is common to have: Tiredness. Little or no memory about what happened during or after the procedure. Impaired judgment when it comes to making decisions. Nausea or vomiting. Some trouble with balance. Follow these instructions at home: For the time period you were told by your health care provider:  Rest. Do not participate in activities where you could fall or become injured. Do not drive or use machinery. Do not drink alcohol. Do not take sleeping pills  or medicines that cause drowsiness. Do not make important decisions or sign legal documents. Do not take care of children on your own. Medicines Take over-the-counter and prescription medicines only as told by your health care provider. If you were prescribed antibiotics, take them as told by your health care provider. Do not stop using the antibiotic even if you start to feel better. Eating and drinking Follow instructions from your health care provider about what you may eat and drink. Drink enough fluid to keep your urine pale yellow. If you vomit: Drink clear fluids slowly and in small amounts as you are able. Clear fluids include water, ice chips, low-calorie sports drinks, and fruit juice that has water added to it (diluted fruit juice). Eat light and bland foods in small amounts as you are able. These foods include bananas, applesauce, rice, lean meats, toast, and crackers. General instructions  Have a responsible adult stay with you for the time you are told. It is important to have someone help care for you until you are awake and alert. If you have sleep apnea, surgery and some medicines can increase your risk for breathing problems. Follow instructions from your health care provider about wearing your  sleep device: When you are sleeping. This includes during daytime naps. While taking prescription pain medicines, sleeping medicines, or medicines that make you drowsy. Do not use any products that contain nicotine or tobacco. These products include cigarettes, chewing tobacco, and vaping devices, such as e-cigarettes. If you need help quitting, ask your health care provider. Contact a health care provider if: You feel nauseous or vomit every time you eat or drink. You feel light-headed. You are still sleepy or having trouble with balance after 24 hours. You get a rash. You have a fever. You have redness or swelling around the IV site. Get help right away if: You have trouble breathing. You have new confusion after you get home. These symptoms may be an emergency. Get help right away. Call 911. Do not wait to see if the symptoms will go away. Do not drive yourself to the hospital. This information is not intended to replace advice given to you by your health care provider. Make sure you discuss any questions you have with your health care provider. Document Revised: 07/30/2021 Document Reviewed: 07/30/2021 Elsevier Patient Education  2024 [employer].

## 2023-05-12 ENCOUNTER — Encounter (HOSPITAL_COMMUNITY)
Admission: RE | Admit: 2023-05-12 | Discharge: 2023-05-12 | Disposition: A | Discharge status: Home / Self Care | Payer: Medicare HMO | Source: Ambulatory Visit | Attending: Internal Medicine | Admitting: Internal Medicine

## 2023-05-12 ENCOUNTER — Encounter (HOSPITAL_COMMUNITY): Payer: Self-pay

## 2023-05-12 VITALS — BP 168/83 | HR 82 | Temp 97.8°F | Resp 18 | Ht 68.0 in | Wt 183.9 lb

## 2023-05-12 DIAGNOSIS — Z01818 Encounter for other preprocedural examination: Secondary | ICD-10-CM | POA: Insufficient documentation

## 2023-05-12 DIAGNOSIS — F17210 Nicotine dependence, cigarettes, uncomplicated: Secondary | ICD-10-CM | POA: Diagnosis not present

## 2023-05-12 DIAGNOSIS — D510 Vitamin B12 deficiency anemia due to intrinsic factor deficiency: Secondary | ICD-10-CM | POA: Diagnosis not present

## 2023-05-12 DIAGNOSIS — I639 Cerebral infarction, unspecified: Secondary | ICD-10-CM | POA: Diagnosis not present

## 2023-05-12 DIAGNOSIS — R195 Other fecal abnormalities: Secondary | ICD-10-CM | POA: Diagnosis not present

## 2023-05-12 DIAGNOSIS — F17200 Nicotine dependence, unspecified, uncomplicated: Secondary | ICD-10-CM

## 2023-05-12 HISTORY — DX: Chronic obstructive pulmonary disease, unspecified: J44.9

## 2023-05-12 LAB — CBC WITH DIFFERENTIAL/PLATELET
Abs Immature Granulocytes: 0.02 10*3/uL (ref 0.00–0.07)
Basophils Absolute: 0.1 10*3/uL (ref 0.0–0.1)
Basophils Relative: 1 %
Eosinophils Absolute: 0.1 10*3/uL (ref 0.0–0.5)
Eosinophils Relative: 2 %
HCT: 46.2 % (ref 39.0–52.0)
Hemoglobin: 15.6 g/dL (ref 13.0–17.0)
Immature Granulocytes: 0 %
Lymphocytes Relative: 44 %
Lymphs Abs: 3 10*3/uL (ref 0.7–4.0)
MCH: 29.2 pg (ref 26.0–34.0)
MCHC: 33.8 g/dL (ref 30.0–36.0)
MCV: 86.4 fL (ref 80.0–100.0)
Monocytes Absolute: 0.6 10*3/uL (ref 0.1–1.0)
Monocytes Relative: 9 %
Neutro Abs: 3.1 10*3/uL (ref 1.7–7.7)
Neutrophils Relative %: 44 %
Platelets: 131 10*3/uL — ABNORMAL LOW (ref 150–400)
RBC: 5.35 MIL/uL (ref 4.22–5.81)
RDW: 13.3 % (ref 11.5–15.5)
WBC: 6.8 10*3/uL (ref 4.0–10.5)
nRBC: 0 % (ref 0.0–0.2)

## 2023-05-14 ENCOUNTER — Encounter (HOSPITAL_COMMUNITY): Payer: Self-pay | Admitting: Internal Medicine

## 2023-05-14 ENCOUNTER — Ambulatory Visit (HOSPITAL_COMMUNITY)
Admission: RE | Admit: 2023-05-14 | Discharge: 2023-05-14 | Disposition: A | Discharge status: Home / Self Care | Payer: Medicare HMO | Source: Ambulatory Visit | Attending: Internal Medicine | Admitting: Internal Medicine

## 2023-05-14 ENCOUNTER — Encounter (HOSPITAL_COMMUNITY)
Admission: RE | Disposition: A | Discharge status: Home / Self Care | Payer: Self-pay | Source: Ambulatory Visit | Attending: Internal Medicine

## 2023-05-14 ENCOUNTER — Ambulatory Visit (HOSPITAL_COMMUNITY): Payer: Medicare HMO | Admitting: Anesthesiology

## 2023-05-14 DIAGNOSIS — K635 Polyp of colon: Secondary | ICD-10-CM | POA: Insufficient documentation

## 2023-05-14 DIAGNOSIS — Z87891 Personal history of nicotine dependence: Secondary | ICD-10-CM | POA: Insufficient documentation

## 2023-05-14 DIAGNOSIS — Z980 Intestinal bypass and anastomosis status: Secondary | ICD-10-CM | POA: Diagnosis not present

## 2023-05-14 DIAGNOSIS — R195 Other fecal abnormalities: Secondary | ICD-10-CM | POA: Insufficient documentation

## 2023-05-14 DIAGNOSIS — Z9049 Acquired absence of other specified parts of digestive tract: Secondary | ICD-10-CM | POA: Insufficient documentation

## 2023-05-14 DIAGNOSIS — D124 Benign neoplasm of descending colon: Secondary | ICD-10-CM | POA: Insufficient documentation

## 2023-05-14 DIAGNOSIS — K219 Gastro-esophageal reflux disease without esophagitis: Secondary | ICD-10-CM | POA: Diagnosis not present

## 2023-05-14 DIAGNOSIS — Z955 Presence of coronary angioplasty implant and graft: Secondary | ICD-10-CM | POA: Diagnosis not present

## 2023-05-14 DIAGNOSIS — Z1211 Encounter for screening for malignant neoplasm of colon: Secondary | ICD-10-CM | POA: Diagnosis not present

## 2023-05-14 DIAGNOSIS — Z8711 Personal history of peptic ulcer disease: Secondary | ICD-10-CM | POA: Diagnosis not present

## 2023-05-14 DIAGNOSIS — Z8673 Personal history of transient ischemic attack (TIA), and cerebral infarction without residual deficits: Secondary | ICD-10-CM | POA: Insufficient documentation

## 2023-05-14 DIAGNOSIS — I2510 Atherosclerotic heart disease of native coronary artery without angina pectoris: Secondary | ICD-10-CM

## 2023-05-14 DIAGNOSIS — Z860100 Personal history of colon polyps, unspecified: Secondary | ICD-10-CM | POA: Diagnosis not present

## 2023-05-14 DIAGNOSIS — D122 Benign neoplasm of ascending colon: Secondary | ICD-10-CM

## 2023-05-14 HISTORY — PX: COLONOSCOPY WITH PROPOFOL: SHX5780

## 2023-05-14 HISTORY — PX: POLYPECTOMY: SHX5525

## 2023-05-14 SURGERY — COLONOSCOPY WITH PROPOFOL
Anesthesia: General

## 2023-05-14 MED ORDER — SODIUM CHLORIDE 0.9% FLUSH: 3.0000 mL | Freq: Two times a day (BID) | INTRAVENOUS | Status: DC

## 2023-05-14 MED ORDER — PROPOFOL 10 MG/ML IV BOLUS
INTRAVENOUS | Status: DC | PRN
  Administered 2023-05-14: 80 mg via INTRAVENOUS

## 2023-05-14 MED ORDER — SODIUM CHLORIDE 0.9% FLUSH: 3.0000 mL | INTRAVENOUS | Status: DC | PRN

## 2023-05-14 MED ORDER — LACTATED RINGERS IV SOLN: INTRAVENOUS | Status: DC | PRN

## 2023-05-14 MED ORDER — LIDOCAINE HCL (PF) 2 % IJ SOLN
INTRAMUSCULAR | Status: AC
  Filled 2023-05-14: qty 5

## 2023-05-14 MED ORDER — PROPOFOL 500 MG/50ML IV EMUL
INTRAVENOUS | Status: AC
  Filled 2023-05-14: qty 50

## 2023-05-14 MED ORDER — PROPOFOL 500 MG/50ML IV EMUL
INTRAVENOUS | Status: DC | PRN
  Administered 2023-05-14: 150 ug/kg/min via INTRAVENOUS

## 2023-05-14 MED ORDER — PHENYLEPHRINE 80 MCG/ML (10ML) SYRINGE FOR IV PUSH (FOR BLOOD PRESSURE SUPPORT)
PREFILLED_SYRINGE | INTRAVENOUS | Status: DC | PRN
  Administered 2023-05-14: 160 ug via INTRAVENOUS
  Administered 2023-05-14: 80 ug via INTRAVENOUS
  Administered 2023-05-14: 160 ug via INTRAVENOUS

## 2023-05-14 MED ORDER — STERILE WATER FOR IRRIGATION IR SOLN
Status: DC | PRN
  Administered 2023-05-14: 60 mL

## 2023-05-14 MED ORDER — LIDOCAINE HCL (CARDIAC) PF 100 MG/5ML IV SOSY
PREFILLED_SYRINGE | INTRAVENOUS | Status: DC | PRN
  Administered 2023-05-14: 60 mg via INTRATRACHEAL

## 2023-05-14 NOTE — Anesthesia Preprocedure Evaluation (Addendum)
 Anesthesia Evaluation  Patient identified by MRN, date of birth, ID band Patient awake    Reviewed: Allergy & Precautions, H&P , NPO status , Patient's Chart, lab work & pertinent test results  Airway Mallampati: II  TM Distance: >3 FB Neck ROM: Full    Dental  (+) Dental Advisory Given, Caps All front teeth are capped:   Pulmonary Current Smoker and Patient abstained from smoking., former smoker   Pulmonary exam normal breath sounds clear to auscultation       Cardiovascular + CAD and + Cardiac Stents  Normal cardiovascular exam Rhythm:Regular Rate:Normal  PCI 1995   Neuro/Psych CVA    GI/Hepatic PUD,GERD  Medicated and Controlled,,  Endo/Other    Renal/GU      Musculoskeletal   Abdominal   Peds  Hematology   Anesthesia Other Findings   Reproductive/Obstetrics                             Anesthesia Physical Anesthesia Plan  ASA: 3  Anesthesia Plan: General   Post-op Pain Management: Minimal or no pain anticipated   Induction: Intravenous  PONV Risk Score and Plan: Propofol infusion  Airway Management Planned: Nasal Cannula and Natural Airway  Additional Equipment: None  Intra-op Plan:   Post-operative Plan: Extubation in OR  Informed Consent: I have reviewed the patients History and Physical, chart, labs and discussed the procedure including the risks, benefits and alternatives for the proposed anesthesia with the patient or authorized representative who has indicated his/her understanding and acceptance.       Plan Discussed with: CRNA  Anesthesia Plan Comments:         Anesthesia Quick Evaluation

## 2023-05-14 NOTE — H&P (Signed)
 @LOGO @   Primary Care Physician:  Ladon Applebaum Primary Gastroenterologist:  Dr. [name]  Pre-Procedure History & Physical: HPI:  Frank Lin is a 73 y.o. male here for  surveillance EGD/positive Cologuard.  History of large polyp burden back in [id number] tubulovillous adenomas with high-grade dysplasia ascending colon required surgical resection.  He did not return for 1 year follow-up and or any other follow-up colonoscopy since that time.  He is here for surveillance.  Past Medical History:  Diagnosis Date   Anemia 11/28/2011   B12 deficiency 11/29/2011   CAD in native artery, PCI 1995    Colon polyps 11/30/2011   COPD (chronic obstructive pulmonary disease) (HCC)    GERD (gastroesophageal reflux disease)    Leukopenia 11/30/2011   Pancytopenia (HCC) 11/28/2011   Peptic ulcer 1971   Stroke (HCC) 05/16/2019   Unintentional weight loss 11/28/2011    Past Surgical History:  Procedure Laterality Date   APPENDECTOMY  03/18/1968   CARDIAC CATHETERIZATION  03/18/1994   s/p PTCA/stent (Dr Riley Kill)   COLON RESECTION  01/15/2012   Procedure: HAND ASSISTED LAPAROSCOPIC COLON RESECTION;  Surgeon: Dalia Heading, MD;  Location: [facility];  Service: General;  Laterality: N/A;  partial colectomy   COLONOSCOPY  12/30/2011   RMR: Multiple colonic polyps-status post multiple snare polypectomies and polyp debulking. 1 small rectal polyp removed as described above . Sigmoid and rectum looked good endoscopically.    ESOPHAGOGASTRODUODENOSCOPY  11/29/2014   RMR: Normal esophagus. Status post prior gastric surgery as described above. Abnoraml proximal small bowel status post biopsy. Status psot gastric biopsy.    EYELID CARCINOMA EXCISION Left 06/19/2022   Benign   PARTIAL COLECTOMY  01/15/2012   Procedure: PARTIAL COLECTOMY;  Surgeon: Dalia Heading, MD;  Location: [facility];  Service: General;  Laterality: N/A;   SEPTOPLASTY     STOMACH SURGERY  03/18/1969   Hx PUD     Prior to  Admission medications   Medication Sig Start Date End Date Taking? Authorizing Provider  aspirin EC 81 MG tablet Take 81 mg by mouth daily. Swallow whole.   Yes [provider]  atorvastatin (LIPITOR) 10 MG tablet Take 10 mg by mouth daily. 04/02/22  Yes [provider]  cyanocobalamin (,VITAMIN B-12,) 1000 MCG/ML injection INJECT 1 ML INTRAMUSCULARLY ONCE MONTHLY. 10/19/18  Yes Doreatha Massed, MD    Allergies as of 03/31/2023   (No Known Allergies)    Family History  Problem Relation Age of Onset   Heart failure Brother    Colon cancer Neg Hx     Social History   Socioeconomic History   Marital status: Widowed    Spouse name: Not on file   Number of children: 2   Years of education: Not on file   Highest education level: Not on file  Occupational History   Occupation: Chartered loss adjuster Delivery tech    Employer: [employer]  Tobacco Use   Smoking status: Every Day    Current packs/day: 0.00    Average packs/day: 1 pack/day for 45.0 years (45.0 ttl pk-yrs)    Types: Cigarettes    Start date: 11/28/1951    Last attempt to quit: 11/27/1996    Years since quitting: 26.4   Smokeless tobacco: Never  Vaping Use   Vaping status: Never Used  Substance and Sexual Activity   Alcohol use: No   Drug use: No   Sexual activity: Yes  Other Topics Concern   Not on file  Social History  Narrative   Not on file   Social Drivers of Health   Financial Resource Strain: Not on file  Food Insecurity: Not on file  Transportation Needs: Not on file  Physical Activity: Not on file  Stress: Not on file  Social Connections: Not on file  Intimate Partner Violence: Not on file    Review of Systems: See HPI, otherwise negative ROS  Physical Exam: BP (!) 160/88   Pulse 87   Temp 97.9 F (36.6 C) (Oral)   Resp 18   Ht 5\' 8"  (1.727 m)   Wt 83.4 kg   SpO2 100%   BMI 27.96 kg/m  General:   Alert,  Well-developed, well-nourished, pleasant and  cooperative in NAD Neck:  Supple; no masses or thyromegaly. No significant cervical adenopathy. Lungs:  Clear throughout to auscultation.   No wheezes, crackles, or rhonchi. No acute distress. Heart:  Regular rate and rhythm; no murmurs, clicks, rubs,  or gallops. Abdomen: Non-distended, normal bowel sounds.  Soft and nontender without appreciable mass or hepatosplenomegaly.      Impression: This 73 year old gentleman here with a positive Cologuard.  Past GI history significant for large polyp burden requiring right hemicolectomy 2013 no frank cancer found.  He is far overdue for surveillance examination. The risks, benefits, limitations, alternatives and imponderables have been reviewed with the patient. Questions have been answered. All parties are agreeable.      Notice: This dictation was prepared with Dragon dictation along with smaller phrase technology. Any transcriptional errors that result from this process are unintentional and may not be corrected upon review.

## 2023-05-14 NOTE — Transfer of Care (Addendum)
 Immediate Anesthesia Transfer of Care Note  Patient: Frank Lin  Procedure(s) Performed: COLONOSCOPY WITH PROPOFOL POLYPECTOMY  Patient Location: Short Stay  Anesthesia Type:General  Level of Consciousness: drowsy and patient cooperative  Airway & Oxygen Therapy: Patient Spontanous Breathing and Patient connected to nasal cannula oxygen  Post-op Assessment: Report given to RN and Post -op Vital signs reviewed and stable  Post vital signs: Reviewed and stable  Last Vitals:  Vitals Value Taken Time  BP 114/52 05/14/23   1114  Temp 36.2 05/14/23   1117  Pulse 73 05/14/23   1114  Resp 17 05/14/23   1114  SpO2 96% 05/14/23   1114    Last Pain:  Vitals:   05/14/23 1028  TempSrc:   PainSc: 0-No pain         Complications: No notable events documented.

## 2023-05-14 NOTE — Anesthesia Postprocedure Evaluation (Signed)
 Anesthesia Post Note  Patient: Frank Lin  Procedure(s) Performed: COLONOSCOPY WITH PROPOFOL POLYPECTOMY  Patient location during evaluation: PACU Anesthesia Type: General Level of consciousness: awake and alert Pain management: pain level controlled Vital Signs Assessment: post-procedure vital signs reviewed and stable Respiratory status: spontaneous breathing, nonlabored ventilation, respiratory function stable and patient connected to nasal cannula oxygen Cardiovascular status: blood pressure returned to baseline and stable Postop Assessment: no apparent nausea or vomiting Anesthetic complications: no   There were no known notable events for this encounter.   Last Vitals:  Vitals:   05/14/23 1114 05/14/23 1117  BP: 114/62   Pulse: 71   Resp: 17   Temp:  (!) 36.2 C  SpO2: 96%     Last Pain:  Vitals:   05/14/23 1121  TempSrc:   PainSc: 0-No pain                 Makell Drohan L Laraya Pestka

## 2023-05-14 NOTE — Op Note (Signed)
 [facility] Patient Name: Frank Lin Procedure Date: 05/14/2023 10:10 AM MRN: [id number] Date of Birth: April 13, 1950 Attending MD: Gennette Pac , MD, [id number] CSN: [id number] Age: 73 Admit Type: Outpatient Procedure:                Colonoscopy Indications:              High risk colon cancer surveillance: Personal                            history of colonic polyps Providers:                Gennette Pac, MD, Francoise Ceo RN, RN,                            Pandora Leiter, Technician Referring MD:              Medicines:                Propofol per Anesthesia Complications:            No immediate complications. Estimated Blood Loss:     Estimated blood loss was minimal. Procedure:                Pre-Anesthesia Assessment:                           - Prior to the procedure, a History and Physical                            was performed, and patient medications and                            allergies were reviewed. The patient's tolerance of                            previous anesthesia was also reviewed. The risks                            and benefits of the procedure and the sedation                            options and risks were discussed with the patient.                            All questions were answered, and informed consent                            was obtained. Prior Anticoagulants: The patient has                            taken no anticoagulant or antiplatelet agents. ASA                            Grade Assessment: III - A patient with severe  systemic disease. After reviewing the risks and                            benefits, the patient was deemed in satisfactory                            condition to undergo the procedure.                           After obtaining informed consent, the colonoscope                            was passed under direct vision. Throughout the                            procedure,  the patient's blood pressure, pulse, and                            oxygen saturations were monitored continuously. The                            [id number] scope was introduced through the                            anus and advanced to the the ileocolonic                            anastomosis. The colonoscopy was performed without                            difficulty. The patient tolerated the procedure                            well. The quality of the bowel preparation was                            adequate. The rectum was photographed. The entire                            colon was well visualized. Scope In: 10:33:01 AM Scope Out: 11:07:58 AM Scope Withdrawal Time: 0 hours 32 minutes 48 seconds  Total Procedure Duration: 0 hours 34 minutes 57 seconds  Findings:      The perianal and digital rectal examinations were normal.      16 semi-pedunculated polyps were found in the descending colon and       proximal ascending colon. The polyps were 4 mm to 1.5 cm in size. These       polyps were removed with a cold and hot snare. Polypectomy . Polyps were       completely removed.      The retroflexed view of the distal rectum and anal verge was normal and       showed no anal or rectal abnormalities. Estimated blood loss was minimal. Impression:               - (16) 4 mm-1.5 cm polyps  in the descending colon                            and in the proximal ascending colon, removed with a                            cold snare.                           - The distal rectum and anal verge are normal on                            retroflexion view. Moderate Sedation:      Moderate (conscious) sedation was personally administered by an       anesthesia professional. The following parameters were monitored: oxygen       saturation, heart rate, blood pressure, respiratory rate, EKG, adequacy       of pulmonary ventilation, and response to care. Recommendation:           - Patient  has a contact number available for                            emergencies. The signs and symptoms of potential                            delayed complications were discussed with the                            patient. Return to normal activities tomorrow.                            Written discharge instructions were provided to the                            patient.                           - Advance diet as tolerated.                           - Continue present medications.                           - Repeat colonoscopy date to be determined after                            pending pathology results are reviewed for                            surveillance.                           - Return to GI office (date not yet determined). Procedure Code(s):        --- Professional ---  16109, Colonoscopy, flexible; with removal of                            tumor(s), polyp(s), or other lesion(s) by snare                            technique Diagnosis Code(s):        --- Professional ---                           Z86.010, Personal history of colonic polyps                           D12.4, Benign neoplasm of descending colon                           D12.2, Benign neoplasm of ascending colon CPT copyright 2022 [employer]. All rights reserved. The codes documented in this report are preliminary and upon coder review may  be revised to meet current compliance requirements. Gerrit Friends. Lakeith Careaga, MD Gennette Pac, MD 05/14/2023 11:18:36 AM This report has been signed electronically. Number of Addenda: 0

## 2023-05-14 NOTE — Discharge Instructions (Addendum)
  Colonoscopy Discharge Instructions  Read the instructions outlined below and refer to this sheet in the next few weeks. These discharge instructions provide you with general information on caring for yourself after you leave the hospital. Your doctor may also give you specific instructions. While your treatment has been planned according to the most current medical practices available, unavoidable complications occasionally occur. If you have any problems or questions after discharge, call Dr. [name] at [phone]. ACTIVITY You may resume your regular activity, but move at a slower pace for the next 24 hours.  Take frequent rest periods for the next 24 hours.  Walking will help get rid of the air and reduce the bloated feeling in your belly (abdomen).  No driving for 24 hours (because of the medicine (anesthesia) used during the test).   Do not sign any important legal documents or operate any machinery for 24 hours (because of the anesthesia used during the test).  NUTRITION Drink plenty of fluids.  You may resume your normal diet as instructed by your doctor.  Begin with a light meal and progress to your normal diet. Heavy or fried foods are harder to digest and may make you feel sick to your stomach (nauseated).  Avoid alcoholic beverages for 24 hours or as instructed.  MEDICATIONS You may resume your normal medications unless your doctor tells you otherwise.  WHAT YOU CAN EXPECT TODAY Some feelings of bloating in the abdomen.  Passage of more gas than usual.  Spotting of blood in your stool or on the toilet paper.  IF YOU HAD POLYPS REMOVED DURING THE COLONOSCOPY: No aspirin products for 7 days or as instructed.  No alcohol for 7 days or as instructed.  Eat a soft diet for the next 24 hours.  FINDING OUT THE RESULTS OF YOUR TEST Not all test results are available during your visit. If your test results are not back during the visit, make an appointment with your caregiver to find out the  results. Do not assume everything is normal if you have not heard from your caregiver or the medical facility. It is important for you to follow up on all of your test results.  SEEK IMMEDIATE MEDICAL ATTENTION IF: You have more than a spotting of blood in your stool.  Your belly is swollen (abdominal distention).  You are nauseated or vomiting.  You have a temperature over 101.  You have abdominal pain or discomfort that is severe or gets worse throughout the day.        16 polyps found and removed from your colon today  Further recommendations to follow pending review of pathology report   at patient request, called Marily Memos [phone]-no answer

## 2023-05-15 ENCOUNTER — Encounter (HOSPITAL_COMMUNITY): Payer: Self-pay | Admitting: Internal Medicine

## 2023-05-15 LAB — SURGICAL PATHOLOGY

## 2023-05-19 ENCOUNTER — Encounter: Payer: Self-pay | Admitting: Internal Medicine

## 2023-05-29 DIAGNOSIS — L821 Other seborrheic keratosis: Secondary | ICD-10-CM | POA: Diagnosis not present

## 2023-05-29 DIAGNOSIS — J449 Chronic obstructive pulmonary disease, unspecified: Secondary | ICD-10-CM | POA: Diagnosis not present

## 2023-05-29 DIAGNOSIS — I719 Aortic aneurysm of unspecified site, without rupture: Secondary | ICD-10-CM | POA: Diagnosis not present

## 2023-05-29 DIAGNOSIS — K635 Polyp of colon: Secondary | ICD-10-CM | POA: Diagnosis not present

## 2023-05-29 DIAGNOSIS — Z6828 Body mass index (BMI) 28.0-28.9, adult: Secondary | ICD-10-CM | POA: Diagnosis not present

## 2023-05-29 DIAGNOSIS — Z0001 Encounter for general adult medical examination with abnormal findings: Secondary | ICD-10-CM | POA: Diagnosis not present

## 2023-05-29 DIAGNOSIS — E663 Overweight: Secondary | ICD-10-CM | POA: Diagnosis not present

## 2023-05-29 DIAGNOSIS — I10 Essential (primary) hypertension: Secondary | ICD-10-CM | POA: Diagnosis not present

## 2023-05-29 DIAGNOSIS — F17200 Nicotine dependence, unspecified, uncomplicated: Secondary | ICD-10-CM | POA: Diagnosis not present

## 2023-05-30 ENCOUNTER — Other Ambulatory Visit (HOSPITAL_COMMUNITY): Payer: Self-pay | Admitting: Family Medicine

## 2023-05-30 DIAGNOSIS — F17200 Nicotine dependence, unspecified, uncomplicated: Secondary | ICD-10-CM

## 2023-07-07 ENCOUNTER — Ambulatory Visit (HOSPITAL_COMMUNITY)
Admission: RE | Admit: 2023-07-07 | Discharge: 2023-07-07 | Disposition: A | Discharge status: Home / Self Care | Source: Ambulatory Visit | Attending: Family Medicine | Admitting: Family Medicine

## 2023-07-07 DIAGNOSIS — J439 Emphysema, unspecified: Secondary | ICD-10-CM | POA: Diagnosis not present

## 2023-07-07 DIAGNOSIS — Z122 Encounter for screening for malignant neoplasm of respiratory organs: Secondary | ICD-10-CM | POA: Insufficient documentation

## 2023-07-07 DIAGNOSIS — I700 Atherosclerosis of aorta: Secondary | ICD-10-CM | POA: Diagnosis not present

## 2023-07-07 DIAGNOSIS — Z87891 Personal history of nicotine dependence: Secondary | ICD-10-CM | POA: Diagnosis not present

## 2023-07-07 DIAGNOSIS — F17210 Nicotine dependence, cigarettes, uncomplicated: Secondary | ICD-10-CM | POA: Diagnosis not present

## 2023-07-07 DIAGNOSIS — F17200 Nicotine dependence, unspecified, uncomplicated: Secondary | ICD-10-CM | POA: Insufficient documentation

## 2023-11-14 DIAGNOSIS — H35033 Hypertensive retinopathy, bilateral: Secondary | ICD-10-CM | POA: Diagnosis not present

## 2023-11-14 DIAGNOSIS — H524 Presbyopia: Secondary | ICD-10-CM | POA: Diagnosis not present

## 2023-11-14 DIAGNOSIS — H11153 Pinguecula, bilateral: Secondary | ICD-10-CM | POA: Diagnosis not present

## 2023-11-14 DIAGNOSIS — H35361 Drusen (degenerative) of macula, right eye: Secondary | ICD-10-CM | POA: Diagnosis not present

## 2023-11-14 DIAGNOSIS — H02831 Dermatochalasis of right upper eyelid: Secondary | ICD-10-CM | POA: Diagnosis not present

## 2024-01-29 DIAGNOSIS — R239 Unspecified skin changes: Secondary | ICD-10-CM | POA: Diagnosis not present

## 2024-01-29 DIAGNOSIS — E538 Deficiency of other specified B group vitamins: Secondary | ICD-10-CM | POA: Diagnosis not present

## 2024-01-29 DIAGNOSIS — E782 Mixed hyperlipidemia: Secondary | ICD-10-CM | POA: Diagnosis not present

## 2024-02-02 ENCOUNTER — Encounter (HOSPITAL_COMMUNITY): Payer: Self-pay

## 2024-02-02 ENCOUNTER — Emergency Department (HOSPITAL_COMMUNITY)
Admission: EM | Admit: 2024-02-02 | Discharge: 2024-02-03 | Disposition: A | Discharge status: Home / Self Care | Attending: Emergency Medicine | Admitting: Emergency Medicine

## 2024-02-02 ENCOUNTER — Other Ambulatory Visit: Payer: Self-pay

## 2024-02-02 DIAGNOSIS — R42 Dizziness and giddiness: Secondary | ICD-10-CM | POA: Diagnosis not present

## 2024-02-02 DIAGNOSIS — L820 Inflamed seborrheic keratosis: Secondary | ICD-10-CM | POA: Diagnosis not present

## 2024-02-02 DIAGNOSIS — F17210 Nicotine dependence, cigarettes, uncomplicated: Secondary | ICD-10-CM | POA: Diagnosis not present

## 2024-02-02 DIAGNOSIS — I10 Essential (primary) hypertension: Secondary | ICD-10-CM | POA: Diagnosis not present

## 2024-02-02 DIAGNOSIS — I2510 Atherosclerotic heart disease of native coronary artery without angina pectoris: Secondary | ICD-10-CM | POA: Diagnosis not present

## 2024-02-02 DIAGNOSIS — J449 Chronic obstructive pulmonary disease, unspecified: Secondary | ICD-10-CM | POA: Insufficient documentation

## 2024-02-02 DIAGNOSIS — Z7982 Long term (current) use of aspirin: Secondary | ICD-10-CM | POA: Insufficient documentation

## 2024-02-02 DIAGNOSIS — L568 Other specified acute skin changes due to ultraviolet radiation: Secondary | ICD-10-CM | POA: Diagnosis not present

## 2024-02-02 DIAGNOSIS — D485 Neoplasm of uncertain behavior of skin: Secondary | ICD-10-CM | POA: Diagnosis not present

## 2024-02-02 DIAGNOSIS — L538 Other specified erythematous conditions: Secondary | ICD-10-CM | POA: Diagnosis not present

## 2024-02-02 DIAGNOSIS — R208 Other disturbances of skin sensation: Secondary | ICD-10-CM | POA: Diagnosis not present

## 2024-02-02 NOTE — ED Triage Notes (Signed)
 Pt to ED from home with c/o high blood pressure, pt says he has felt dizzy and lightheaded when moving that started this morning around 9 am, so he checked his BP tonight and reports it was 173/109. Pt says he is not on BP meds

## 2024-02-03 ENCOUNTER — Emergency Department (HOSPITAL_COMMUNITY)

## 2024-02-03 DIAGNOSIS — R000 Tachycardia, unspecified: Secondary | ICD-10-CM | POA: Diagnosis not present

## 2024-02-03 DIAGNOSIS — I6782 Cerebral ischemia: Secondary | ICD-10-CM | POA: Diagnosis not present

## 2024-02-03 DIAGNOSIS — I700 Atherosclerosis of aorta: Secondary | ICD-10-CM | POA: Diagnosis not present

## 2024-02-03 DIAGNOSIS — R911 Solitary pulmonary nodule: Secondary | ICD-10-CM | POA: Diagnosis not present

## 2024-02-03 DIAGNOSIS — R42 Dizziness and giddiness: Secondary | ICD-10-CM | POA: Diagnosis not present

## 2024-02-03 DIAGNOSIS — R4182 Altered mental status, unspecified: Secondary | ICD-10-CM | POA: Diagnosis not present

## 2024-02-03 DIAGNOSIS — J439 Emphysema, unspecified: Secondary | ICD-10-CM | POA: Diagnosis not present

## 2024-02-03 DIAGNOSIS — I10 Essential (primary) hypertension: Secondary | ICD-10-CM | POA: Diagnosis not present

## 2024-02-03 DIAGNOSIS — I2510 Atherosclerotic heart disease of native coronary artery without angina pectoris: Secondary | ICD-10-CM | POA: Diagnosis not present

## 2024-02-03 LAB — URINALYSIS, ROUTINE W REFLEX MICROSCOPIC
Bacteria, UA: NONE SEEN
Bilirubin Urine: NEGATIVE
Glucose, UA: NEGATIVE mg/dL
Ketones, ur: NEGATIVE mg/dL
Nitrite: NEGATIVE
Protein, ur: NEGATIVE mg/dL
Specific Gravity, Urine: 1.005 (ref 1.005–1.030)
pH: 6 (ref 5.0–8.0)

## 2024-02-03 LAB — CBC WITH DIFFERENTIAL/PLATELET
Abs Immature Granulocytes: 0.02 K/uL (ref 0.00–0.07)
Basophils Absolute: 0 K/uL (ref 0.0–0.1)
Basophils Relative: 1 %
Eosinophils Absolute: 0.2 K/uL (ref 0.0–0.5)
Eosinophils Relative: 2 %
HCT: 45.4 % (ref 39.0–52.0)
Hemoglobin: 16 g/dL (ref 13.0–17.0)
Immature Granulocytes: 0 %
Lymphocytes Relative: 35 %
Lymphs Abs: 2.2 K/uL (ref 0.7–4.0)
MCH: 30.6 pg (ref 26.0–34.0)
MCHC: 35.2 g/dL (ref 30.0–36.0)
MCV: 86.8 fL (ref 80.0–100.0)
Monocytes Absolute: 0.5 K/uL (ref 0.1–1.0)
Monocytes Relative: 8 %
Neutro Abs: 3.4 K/uL (ref 1.7–7.7)
Neutrophils Relative %: 54 %
Platelets: 120 K/uL — ABNORMAL LOW (ref 150–400)
RBC: 5.23 MIL/uL (ref 4.22–5.81)
RDW: 13.6 % (ref 11.5–15.5)
WBC: 6.3 K/uL (ref 4.0–10.5)
nRBC: 0 % (ref 0.0–0.2)

## 2024-02-03 LAB — COMPREHENSIVE METABOLIC PANEL WITH GFR
ALT: 7 U/L (ref 0–44)
AST: 17 U/L (ref 15–41)
Albumin: 3.6 g/dL (ref 3.5–5.0)
Alkaline Phosphatase: 124 U/L (ref 38–126)
Anion gap: 11 (ref 5–15)
BUN: 9 mg/dL (ref 8–23)
CO2: 24 mmol/L (ref 22–32)
Calcium: 9 mg/dL (ref 8.9–10.3)
Chloride: 104 mmol/L (ref 98–111)
Creatinine, Ser: 1.06 mg/dL (ref 0.61–1.24)
GFR, Estimated: >60 mL/min (ref >60)
Glucose, Bld: 133 mg/dL — ABNORMAL HIGH (ref 70–99)
Potassium: 3.9 mmol/L (ref 3.5–5.1)
Sodium: 138 mmol/L (ref 135–145)
Total Bilirubin: 0.8 mg/dL (ref 0.0–1.2)
Total Protein: 6.4 g/dL — ABNORMAL LOW (ref 6.5–8.1)

## 2024-02-03 LAB — TROPONIN T, HIGH SENSITIVITY
Troponin T High Sensitivity: <15 ng/L (ref 0–19)
Troponin T High Sensitivity: <15 ng/L (ref 0–19)

## 2024-02-03 LAB — LIPASE, BLOOD: Lipase: 12 U/L (ref 11–51)

## 2024-02-03 MED ORDER — AMLODIPINE BESYLATE 5 MG PO TABS
5.0000 mg | ORAL_TABLET | Freq: Once | ORAL | Status: AC
  Administered 2024-02-03: 5 mg via ORAL
  Filled 2024-02-03: qty 1

## 2024-02-03 MED ORDER — LACTATED RINGERS IV BOLUS
1000.0000 mL | Freq: Once | INTRAVENOUS | Status: AC
  Administered 2024-02-03: 1000 mL via INTRAVENOUS

## 2024-02-03 MED ORDER — MECLIZINE HCL 12.5 MG PO TABS
25.0000 mg | ORAL_TABLET | Freq: Once | ORAL | Status: AC
  Administered 2024-02-03: 25 mg via ORAL
  Filled 2024-02-03: qty 2

## 2024-02-03 MED ORDER — MECLIZINE HCL 25 MG PO TABS: 25.0000 mg | ORAL_TABLET | Freq: Three times a day (TID) | ORAL | 0 refills | Status: AC | PRN

## 2024-02-03 MED ORDER — IOHEXOL 350 MG/ML SOLN
75.0000 mL | Freq: Once | INTRAVENOUS | Status: AC | PRN
  Administered 2024-02-03: 75 mL via INTRAVENOUS

## 2024-02-03 MED ORDER — LORAZEPAM 1 MG PO TABS
1.0000 mg | ORAL_TABLET | Freq: Once | ORAL | Status: AC
  Administered 2024-02-03: 1 mg via ORAL
  Filled 2024-02-03: qty 1

## 2024-02-03 MED ORDER — AMLODIPINE BESYLATE 5 MG PO TABS: 5.0000 mg | ORAL_TABLET | Freq: Every day | ORAL | 1 refills | Status: AC

## 2024-02-03 NOTE — ED Notes (Signed)
Patient given urinal and made aware of need for sample

## 2024-02-03 NOTE — Discharge Instructions (Addendum)
 Your test results today are reassuring.    A prescription for medication called amlodipine was sent to your pharmacy.  Take this daily for treatment of hypertension.  Follow-up with your PCP for ongoing management and medication adjustments as needed.  A separate medication called meclizine was sent to your pharmacy.  This is for treatment of dizziness.  Take this only as needed.  Drink plenty of fluids to stay hydrated.    Return to the emergency department for any new or worsening symptoms of concern.

## 2024-02-03 NOTE — ED Provider Notes (Signed)
 Care of patient assumed from Dr. Lorette.  This patient presented with dizziness, lightheadedness, and elevated blood pressure.  He is not currently on any antihypertensive medications.  His orthostatic symptoms and heart rate have improved after IV fluids.  He is currently awaiting MRI. Physical Exam  BP (!) 170/77 (BP Location: Right Arm)   Pulse 70   Temp 98.3 F (36.8 C) (Oral)   Resp 18   Ht 5' 9 (1.753 m)   Wt 83.4 kg   SpO2 94%   BMI 27.15 kg/m   Physical Exam Vitals and nursing note reviewed.  Constitutional:      General: He is not in acute distress.    Appearance: Normal appearance. He is well-developed. He is not ill-appearing, toxic-appearing or diaphoretic.  HENT:     Head: Normocephalic and atraumatic.     Right Ear: External ear normal.     Left Ear: External ear normal.     Nose: Nose normal.     Mouth/Throat:     Mouth: Mucous membranes are moist.  Eyes:     Extraocular Movements: Extraocular movements intact.     Conjunctiva/sclera: Conjunctivae normal.  Cardiovascular:     Rate and Rhythm: Normal rate and regular rhythm.  Pulmonary:     Effort: Pulmonary effort is normal. No respiratory distress.  Abdominal:     General: There is no distension.     Palpations: Abdomen is soft.  Musculoskeletal:        General: Normal range of motion.     Cervical back: Normal range of motion and neck supple.  Skin:    General: Skin is warm and dry.  Neurological:     General: No focal deficit present.     Mental Status: He is alert and oriented to person, place, and time.  Psychiatric:        Mood and Affect: Mood normal.        Behavior: Behavior normal.     Procedures  Procedures  ED Course / MDM   Clinical Course as of 02/03/24 0956  Tue Feb 03, 2024  0031 Initial Evaluation:  - Patient presents with dizziness and lightheadedness, suddenly after a dermatological procedure, with a history of high blood pressure, stroke, and stent  placement.   Plan:  - Conduct EKG and check troponin levels - CT head - Consider CTA's depending on response to medications here and laboratory findings - Review medical records for blood pressure management and vascular history - Administer medication for vertigo and nausea - Provide fluids to address potential dehydration  - Monitor the patient's response and consider further evaluation if symptoms persist [JM]  0032 BP(!): 194/90 On review of the records sometimes he is 135-150 range in the office, often times he is well above 150 systolic [JM]  0033 Pulse Rate(!): 102 [JM]  0033 Resp: 20 [JM]  0059 CT Head Wo Contrast [JM]  0100 DG Chest Portable 1 View On my interpretation I felt that he had wide mediastinum however compared to scout image for recent CT scan it does appear about the same.  Radiology read reviewed.  No other focal pulmonary findings.  [JM]  0101 Small amount of volume loss on CT scan no acute infarct noted or blood. [JM]  0630 Still with quite a bit of vertigo when he stands up.  His CT angio was negative aside from chronic findings.  Will add on MRI to rule out central causes for vertigo and try some Ativan to see if  that helps with his vertigo enough to the point that he can walk. [JM]    Clinical Course User Index [JM] Mesner, Selinda, MD   Medical Decision Making Amount and/or Complexity of Data Reviewed Labs: ordered. Radiology: ordered. Decision-making details documented in ED Course. ECG/medicine tests: ordered.  Risk Prescription drug management.   On assessment, patient sleeping.  He is easily awakened.  His blood pressure has improved to the range of 150s SBP.  He states that he does have a longstanding history of hypertension that has simply never been treated.  MRI does not show any acute findings.  Patient was prescribed low-dose amlodipine as well as as needed meclizine.  He was discharged in stable condition.       Melvenia Motto, MD 02/03/24  [phone]

## 2024-02-03 NOTE — ED Provider Notes (Signed)
 Emergency Department Provider Note  TRIAGE NOTE: Pt to ED from home with c/o high blood pressure, pt says he has felt dizzy and lightheaded when moving that started this morning around 9 am, so he checked his BP tonight and reports it was 173/109. Pt says he is not on BP meds  HISTORY  Chief Complaint Hypertension   HPI Frank Lin is a 73 y.o. male with  with dizziness and lightheadedness that began around 9-10 PM after undergoing dermatological procedures on his arm and forehead earlier in the day. The dizziness is described as a lightheaded sensation with a significant balance component, particularly upon standing, without any spinning sensation. The patient reports associated nausea, which worsens with movement, but denies any recent falls, diarrhea, or pain. He has a significant past medical history including a stroke approximately six years ago, a heart attack in 1996 with subsequent stent placement (LAD), and an enlarged aorta. He has a history of hypertension, though he is not currently on medication for it, and he smokes tobacco. The patient reports consuming only a sandwich in the afternoon and primarily drinks sweet tea. He denies recent illnesses or changes in urination. History was obtained from the patient and daughter.   PMH Past Medical History:  Diagnosis Date   Anemia 11/28/2011   B12 deficiency 11/29/2011   CAD in native artery, PCI 1995    Colon polyps 11/30/2011   COPD (chronic obstructive pulmonary disease) (HCC)    GERD (gastroesophageal reflux disease)    Leukopenia 11/30/2011   Pancytopenia (HCC) 11/28/2011   Peptic ulcer 1971   Stroke (HCC) 05/16/2019   Unintentional weight loss 11/28/2011    Home Medications Prior to Admission medications   Medication Sig Start Date End Date Taking? Authorizing Provider  aspirin  EC 81 MG tablet Take 81 mg by mouth daily. Swallow whole.    [provider]  atorvastatin  (LIPITOR) 10 MG tablet Take 10 mg by  mouth daily. 04/02/22   [provider]  cyanocobalamin  (,VITAMIN B-12,) 1000 MCG/ML injection INJECT 1 ML INTRAMUSCULARLY ONCE MONTHLY. 10/19/18   Rogers Hai, MD    Social History Social History   Tobacco Use   Smoking status: Every Day    Current packs/day: 0.00    Average packs/day: 1 pack/day for 45.0 years (45.0 ttl pk-yrs)    Types: Cigarettes    Start date: [birth year]/28/1952    Last attempt to quit: 11/27/1996    Years since quitting: 27.2   Smokeless tobacco: Never  Vaping Use   Vaping status: Never Used  Substance Use Topics   Alcohol use: No   Drug use: No    Review of Systems: Documented in HPI ____________________________________________  PHYSICAL EXAM: VITAL SIGNS: Triage: Blood pressure (!) 194/90, pulse (!) 102, resp. rate 20, height 5' 9 (1.753 m), weight 83.4 kg, SpO2 99%.  Vitals:   02/02/24 2355 02/03/24 0000  BP:  (!) 194/90  Pulse:  (!) 102  Resp:  20  SpO2:  99%  Weight: 83.4 kg   Height: 5' 9 (1.753 m)     Physical Exam Vitals and nursing note reviewed.  Constitutional:      Appearance: He is well-developed.  HENT:     Head: Normocephalic and atraumatic.  Cardiovascular:     Rate and Rhythm: Tachycardia present.     Comments: hypertensive Pulmonary:     Effort: Pulmonary effort is normal. No respiratory distress.  Abdominal:     General: There is no distension.  Musculoskeletal:  General: Normal range of motion.     Cervical back: Normal range of motion.  Neurological:     Mental Status: He is alert.       ____________________________________________   LABS (all labs ordered are listed, but only abnormal results are displayed)  Labs Reviewed  CBC WITH DIFFERENTIAL/PLATELET - Abnormal; Notable for the following components:      Result Value   Platelets 120 (*)    All other components within normal limits  COMPREHENSIVE METABOLIC PANEL WITH GFR - Abnormal; Notable for the following components:   Glucose, Bld  133 (*)    Total Protein 6.4 (*)    All other components within normal limits  URINALYSIS, ROUTINE W REFLEX MICROSCOPIC - Abnormal; Notable for the following components:   Hgb urine dipstick SMALL (*)    Leukocytes,Ua TRACE (*)    All other components within normal limits  LIPASE, BLOOD  TROPONIN T, HIGH SENSITIVITY  TROPONIN T, HIGH SENSITIVITY   ____________________________________________  EKG   EKG Interpretation Date/Time:  Tuesday February 03 2024 00:00:22 EST Ventricular Rate:  102 PR Interval:  163 QRS Duration:  89 QT Interval:  345 QTC Calculation: 450 R Axis:   0  Text Interpretation: Sinus tachycardia Confirmed by Melvenia Motto ([id number]) on 02/03/2024 8:50:48 AM        ____________________________________________  RADIOLOGY  MR BRAIN WO CONTRAST Result Date: 02/03/2024 EXAM: MRI BRAIN WITHOUT CONTRAST 02/03/2024 07:33:24 AM TECHNIQUE: Multiplanar multisequence MRI of the head/brain was performed without the administration of intravenous contrast. COMPARISON: Head CT 02/03/2024 and MRI 05/12/2020. CLINICAL HISTORY: Vertigo. FINDINGS: BRAIN AND VENTRICLES: There is no evidence of an acute infarct, intracranial hemorrhage, mass, midline shift, hydrocephalus, or extra-axial fluid collection. T2 hyperintensities in the cerebral white matter bilaterally are stable to slightly increased compared to the prior MRI and are nonspecific but compatible with mild chronic small vessel ischemic disease. Chronic lacunar infarcts are again seen in the thalami and cerebellum bilaterally. Mild generalized cerebral atrophy is within normal limits for age. Major intracranial vascular flow voids are preserved. ORBITS: Bilateral cataract extraction. SINUSES AND MASTOIDS: Moderate bilateral mastoid effusions. Clear paranasal sinuses. BONES AND SOFT TISSUES: Normal marrow signal. No acute soft tissue abnormality. IMPRESSION: 1. No acute intracranial abnormality. 2. Mild chronic small vessel ischemic  disease with chronic lacunar infarcts as above. Electronically signed by: Dasie Hamburg MD 02/03/2024 08:41 AM EST RP Workstation: [id number]   CT Angio Chest Aorta W and/or Wo Contrast Result Date: 02/03/2024 EXAM: CTA CHEST AORTA 02/03/2024 04:25:34 AM TECHNIQUE: CTA of the chest was performed after the administration of intravenous contrast. Multiplanar reformatted images are provided for review. MIP images are provided for review. Automated exposure control, iterative reconstruction, and/or weight based adjustment of the mA/kV was utilized to reduce the radiation dose to as low as reasonably achievable. COMPARISON: Lung cancer screening CT 07/07/2023. CLINICAL HISTORY: Acute aortic syndrome (AAS) suspected. FINDINGS: AORTA: Aortic atherosclerosis. No thoracic aortic dissection. No aneurysm. MEDIASTINUM: Coronary artery atherosclerotic calcifications. Heart size is within upper limits of normal. No pericardial effusion. LYMPH NODES: Right hilar lymph node measures 1 cm, image 82/4. No mediastinal or axillary lymphadenopathy. LUNGS AND PLEURA: Mild emphysema with diffuse bronchial wall thickening. Subpleural nodule within the posterior right upper lobe measures 4 mm, image 41/6. No focal consolidation or pulmonary edema. No pleural effusion or pneumothorax. UPPER ABDOMEN: Clips identified within the upper abdomen. SOFT TISSUES AND BONES: No acute or suspicious osseous findings. No acute soft tissue abnormality. IMPRESSION: 1. No acute abnormality  of the aorta. 2. Subpleural nodule in the right upper lobe measures 4 mm. In a patient who was at low risk, no further follow-up is indicated. If the patient is at increased risk a follow-up CT of the chest without contrast material in 12 months may be considered 3. Aortic atherosclerosis and emphysema. 4. Coronary artery calcifications. Electronically signed by: Waddell Calk MD 02/03/2024 06:05 AM EST RP Workstation: [id number]   DG Chest Portable 1 View Result  Date: 02/03/2024 EXAM: 1 VIEW(S) XRAY OF THE CHEST 02/03/2024 12:40:00 AM COMPARISON: None available. CLINICAL HISTORY: lightheaded, tachy, htn FINDINGS: LINES, TUBES AND DEVICES: Multiple overlying monitor wires noted. LUNGS AND PLEURA: No focal pulmonary opacity. No pleural effusion. No pneumothorax. HEART AND MEDIASTINUM: No acute abnormality of the cardiac and mediastinal silhouettes. BONES AND SOFT TISSUES: No acute osseous abnormality. IMPRESSION: 1. No acute process. Electronically signed by: Dorethia Molt MD 02/03/2024 12:51 AM EST RP Workstation: [id number]   CT Head Wo Contrast Result Date: 02/03/2024 EXAM: CT HEAD WITHOUT CONTRAST 02/03/2024 12:33:54 AM TECHNIQUE: CT of the head was performed without the administration of intravenous contrast. Automated exposure control, iterative reconstruction, and/or weight based adjustment of the mA/kV was utilized to reduce the radiation dose to as low as reasonably achievable. COMPARISON: CT Head May 12, 2020 CLINICAL HISTORY: Mental status change, unknown cause FINDINGS: BRAIN AND VENTRICLES: No acute hemorrhage. No evidence of acute infarct. No hydrocephalus. No extra-axial collection. No mass effect or midline shift. ORBITS: No acute abnormality. SINUSES: No acute abnormality. SOFT TISSUES AND SKULL: No acute soft tissue abnormality. No skull fracture. IMPRESSION: 1. No acute intracranial abnormality. Electronically signed by: Gilmore Molt MD 02/03/2024 12:48 AM EST RP Workstation: [id number]   ____________________________________________  PROCEDURES  Procedure(s) performed:   Procedures ____________________________________________  INITIAL IMPRESSION / ASSESSMENT AND PLAN   ED Course    Clinical Course as of 02/03/24 2317  Tue Feb 03, 2024  0031 Initial Evaluation:  - Patient presents with dizziness and lightheadedness, suddenly after a dermatological procedure, with a history of high blood pressure, stroke, and stent  placement.   Plan:  - Conduct EKG and check troponin levels - CT head - Consider CTA's depending on response to medications here and laboratory findings - Review medical records for blood pressure management and vascular history - Administer medication for vertigo and nausea - Provide fluids to address potential dehydration  - Monitor the patient's response and consider further evaluation if symptoms persist [JM]  0032 BP(!): 194/90 On review of the records sometimes he is 135-150 range in the office, often times he is well above 150 systolic [JM]  0033 Pulse Rate(!): 102 [JM]  0033 Resp: 20 [JM]  0059 CT Head Wo Contrast [JM]  0100 DG Chest Portable 1 View On my interpretation I felt that he had wide mediastinum however compared to scout image for recent CT scan it does appear about the same.  Radiology read reviewed.  No other focal pulmonary findings.  [JM]  0101 Small amount of volume loss on CT scan no acute infarct noted or blood. [JM]  0630 Still with quite a bit of vertigo when he stands up.  His CT angio was negative aside from chronic findings.  Will add on MRI to rule out central causes for vertigo and try some Ativan to see if that helps with his vertigo enough to the point that he can walk. [JM]    Clinical Course User Index [JM] Juana Montini, Selinda, MD     Images ordered viewed  and obtained by myself. Agree with Radiology interpretation. Details in ED course.  Labs ordered reviewed by myself as detailed in ED course.  Consultations obtained/considered detailed in ED course.    CRITICAL INTERVENTIONS:  N/a  Care transferred pending completion of workup, reevaluation and disposition.     Lorette Mayo, MD 02/03/24 [phone]

## 2024-02-03 NOTE — ED Notes (Signed)
 Patient to CT.

## 2024-04-23 ENCOUNTER — Encounter (INDEPENDENT_AMBULATORY_CARE_PROVIDER_SITE_OTHER): Payer: Self-pay | Admitting: *Deleted

## 2024-04-27 ENCOUNTER — Encounter: Admitting: Dermatology

## 2024-05-25 ENCOUNTER — Ambulatory Visit: Admitting: Physician Assistant
